# Patient Record
Sex: Female | Born: 1965 | Race: White | Hispanic: No | Marital: Married | Smoking: Former smoker
Health system: Southern US, Community
[De-identification: ages and names within clinical notes are randomized; demographics above are authoritative.]

## PROBLEM LIST (undated history)

## (undated) DIAGNOSIS — C4491 Basal cell carcinoma of skin, unspecified: Secondary | ICD-10-CM

## (undated) DIAGNOSIS — E119 Type 2 diabetes mellitus without complications: Secondary | ICD-10-CM

## (undated) DIAGNOSIS — I10 Essential (primary) hypertension: Secondary | ICD-10-CM

## (undated) DIAGNOSIS — U071 COVID-19: Secondary | ICD-10-CM

## (undated) HISTORY — DX: COVID-19: U07.1

## (undated) HISTORY — DX: Essential (primary) hypertension: I10

## (undated) HISTORY — PX: ENDOMETRIAL ABLATION: SHX621

## (undated) HISTORY — DX: Basal cell carcinoma of skin, unspecified: C44.91

## (undated) HISTORY — PX: ABDOMINAL HYSTERECTOMY: SHX81

## (undated) HISTORY — DX: Type 2 diabetes mellitus without complications: E11.9

---

## 1999-05-04 ENCOUNTER — Inpatient Hospital Stay (HOSPITAL_COMMUNITY): Admission: AD | Admit: 1999-05-04 | Discharge: 1999-05-08 | Payer: Self-pay | Admitting: Obstetrics and Gynecology

## 1999-05-12 ENCOUNTER — Encounter (HOSPITAL_COMMUNITY): Admission: RE | Admit: 1999-05-12 | Discharge: 1999-08-10 | Payer: Self-pay | Admitting: Obstetrics and Gynecology

## 1999-11-10 ENCOUNTER — Other Ambulatory Visit: Admission: RE | Admit: 1999-11-10 | Discharge: 1999-11-10 | Payer: Self-pay | Admitting: Obstetrics and Gynecology

## 2000-12-20 ENCOUNTER — Other Ambulatory Visit: Admission: RE | Admit: 2000-12-20 | Discharge: 2000-12-20 | Payer: Self-pay | Admitting: Obstetrics and Gynecology

## 2001-02-17 ENCOUNTER — Other Ambulatory Visit: Admission: RE | Admit: 2001-02-17 | Discharge: 2001-02-17 | Payer: Self-pay | Admitting: Obstetrics and Gynecology

## 2005-03-14 ENCOUNTER — Ambulatory Visit: Payer: Self-pay | Admitting: Family Medicine

## 2006-10-21 ENCOUNTER — Inpatient Hospital Stay (HOSPITAL_COMMUNITY): Admission: RE | Admit: 2006-10-21 | Discharge: 2006-10-23 | Payer: Self-pay | Admitting: Obstetrics and Gynecology

## 2011-12-02 ENCOUNTER — Emergency Department (HOSPITAL_COMMUNITY)
Admission: EM | Admit: 2011-12-02 | Discharge: 2011-12-02 | Disposition: A | Discharge status: Home / Self Care | Payer: BC Managed Care – PPO | Source: Home / Self Care | Attending: Emergency Medicine | Admitting: Emergency Medicine

## 2011-12-02 ENCOUNTER — Encounter (HOSPITAL_COMMUNITY): Payer: Self-pay | Admitting: *Deleted

## 2011-12-02 DIAGNOSIS — L03019 Cellulitis of unspecified finger: Secondary | ICD-10-CM

## 2011-12-02 MED ORDER — CEFTRIAXONE SODIUM 1 G IJ SOLR
1.0000 g | Freq: Once | INTRAMUSCULAR | Status: AC
  Administered 2011-12-02: 1 g via INTRAMUSCULAR

## 2011-12-02 MED ORDER — LIDOCAINE HCL (PF) 1 % IJ SOLN
INTRAMUSCULAR | Status: AC
  Filled 2011-12-02: qty 5

## 2011-12-02 MED ORDER — TRAMADOL HCL 50 MG PO TABS: 100.0000 mg | ORAL_TABLET | Freq: Three times a day (TID) | ORAL | Status: AC | PRN

## 2011-12-02 MED ORDER — CEFTRIAXONE SODIUM 1 G IJ SOLR
INTRAMUSCULAR | Status: AC
  Filled 2011-12-02: qty 10

## 2011-12-02 MED ORDER — SULFAMETHOXAZOLE-TMP DS 800-160 MG PO TABS: 2.0000 | ORAL_TABLET | Freq: Two times a day (BID) | ORAL | Status: AC

## 2011-12-02 NOTE — ED Provider Notes (Signed)
Chief Complaint  Patient presents with  . Insect Bite    History of Present Illness:  Brandy Hernandez was getting a sled out from under her her house last night when she felt a sudden sticking sensation over the dorsal, proximal phalanx of the right middle finger. After she had a small red papule in this area. It's gotten progressively more swollen, tender, and she has redness extending to the PIP joint and down onto the hand. She is able to move all of her joints but with pain. She denies any fever or chills. She did not see anything biting her and she denies any paresthesias or weakness. There has been a small amount of serous drainage from a small papule.  Review of Systems:  Other than noted above, the patient denies any of the following symptoms: Systemic:  No fevers, chills, sweats, or aches.  No fatigue or tiredness. Musculoskeletal:  No joint pain, arthritis, bursitis, swelling, back pain, or neck pain. Neurological:  No muscular weakness, paresthesias, headache, or trouble with speech or coordination.  No dizziness.   PMFSH:  Past medical history, family history, social history, meds, and allergies were reviewed.  Physical Exam:   Vital signs:  BP 135/93  Pulse 82  Temp(Src) 98.3 F (36.8 C) (Oral)  Resp 14  SpO2 98% Gen:  Alert and oriented times 3.  In no distress. Musculoskeletal: There is a small ulcerated area over the dorsum of the proximal phalanx of the right middle finger. There is no fluctuance. This was draining a minimal amount of serous fluid. The fluid was cultured. There is swelling and erythema extending to the knee joint and down onto the hand as well. She is able to move all of her joints but it does hurt. Otherwise, all joints had a full a ROM with no swelling, bruising or deformity.  No edema, pulses full. Extremities were warm and pink.  Capillary refill was brisk.  Skin:  Clear, warm and dry.  No rash. Neuro:  Alert and oriented times 3.  Muscle strength was normal.   Sensation was intact to light touch.   Labs:  No results found for this or any previous visit.   A culture was obtained.  Radiology:  No results found.  Medications given in [facility]:  Rocephin 1 g IM  Assessment:   Diagnoses that have been ruled out:  None  Diagnoses that are still under consideration:  None  Final diagnoses:  Cellulitis, finger - this could have been due to a bite or just to a wound.    Plan:   1.  The following meds were prescribed:   New Prescriptions   SULFAMETHOXAZOLE-TRIMETHOPRIM (BACTRIM DS) 800-160 MG PER TABLET    Take 2 tablets by mouth 2 (two) times daily.   TRAMADOL (ULTRAM) 50 MG TABLET    Take 2 tablets (100 mg total) by mouth every 8 (eight) hours as needed for pain.   2.  The patient was instructed in symptomatic care, including rest and activity, elevation, application of ice and compression.  Appropriate handouts were given. 3.  The patient was told to return if becoming worse in any way, if no better in 3 or 4 days, and given some red flag symptoms that would indicate earlier return.   4.  The patient was told to follow up in 48 hours here at the urgent care Center or gets worse in any way to come back right away.  Roque Lias, MD 12/02/11 1134

## 2011-12-02 NOTE — ED Notes (Signed)
Pt co "spider bite" to right long finger since yesterday, abcess to finger with redness, swelling and pain starting this am

## 2011-12-05 LAB — WOUND CULTURE: Gram Stain: NONE SEEN

## 2011-12-06 NOTE — ED Notes (Signed)
Wound culture R finger: Few Staph. Aureus. Pt. adequately treated with Bactrim DS. Vassie Moselle 12/06/2011

## 2013-07-13 ENCOUNTER — Telehealth: Payer: Self-pay | Admitting: Nurse Practitioner

## 2013-07-13 ENCOUNTER — Ambulatory Visit (INDEPENDENT_AMBULATORY_CARE_PROVIDER_SITE_OTHER): Payer: BC Managed Care – PPO | Admitting: Nurse Practitioner

## 2013-07-13 ENCOUNTER — Encounter: Payer: Self-pay | Admitting: Nurse Practitioner

## 2013-07-13 VITALS — BP 157/89 | HR 83 | Temp 97.5°F | Ht 70.0 in | Wt 290.0 lb

## 2013-07-13 DIAGNOSIS — M722 Plantar fascial fibromatosis: Secondary | ICD-10-CM

## 2013-07-13 MED ORDER — PREDNISONE 20 MG PO TABS: ORAL_TABLET | ORAL | Status: DC

## 2013-07-13 MED ORDER — METHYLPREDNISOLONE ACETATE 80 MG/ML IJ SUSP
80.0000 mg | Freq: Once | INTRAMUSCULAR | Status: AC
  Administered 2013-07-13: 80 mg via INTRAMUSCULAR

## 2013-07-13 NOTE — Progress Notes (Signed)
  Subjective:    Patient ID: Brandy Hernandez, female    DOB: 04/15/1966, 47 y.o.   MRN: [id number]  Foot Injury  The incident occurred 3 to 5 days ago (four days ago). Incident location: Pt has been walking and excerise daily. The injury mechanism is unknown. The pain is present in the left foot. The quality of the pain is described as stabbing. The pain is at a severity of 10/10. The pain is severe. The pain has been worsening since onset. Associated symptoms include an inability to bear weight. Pertinent negatives include no numbness or tingling. She reports no foreign bodies present. The symptoms are aggravated by weight bearing and movement. She has tried acetaminophen, elevation, ice, NSAIDs, non-weight bearing and rest for the symptoms. The treatment provided no relief.      Review of Systems  Neurological: Negative for tingling and numbness.  All other systems reviewed and are negative.       Objective:   Physical Exam  Vitals reviewed. Constitutional: She is oriented to person, place, and time. She appears well-developed and well-nourished.  Cardiovascular: Normal rate, regular rhythm, normal heart sounds and intact distal pulses.   Pulmonary/Chest: Effort normal and breath sounds normal.  Abdominal: Soft. Bowel sounds are normal. She exhibits no distension. There is no tenderness.  Musculoskeletal: She exhibits tenderness.  Pt has decreased ROM with flexion on left foot due to pain. Any weight bearing movement is "extremely painful"  Neurological: She is alert and oriented to person, place, and time.  Skin: Skin is warm and dry.  Psychiatric: She has a normal mood and affect. Her behavior is normal. Judgment and thought content normal.     BP 157/89  Pulse 83  Temp(Src) 97.5 F (36.4 C) (Oral)  Ht 5\' 10"  (1.778 m)  Wt 290 lb (131.543 kg)  BMI 41.61 kg/m2      Assessment & Plan:  1. Plantar fasciitis of left foot Ice roller to foot - methylPREDNISolone acetate  (DEPO-MEDROL) injection 80 mg; Inject 1 mL (80 mg total) into the muscle once. - predniSONE (DELTASONE) 20 MG tablet; 2 po qd (sametime everyday) X 10 days  Dispense: 10 tablet; Refill: 0 Start med tomorrow.  Mary-Margaret Daphine Deutscher, FNP

## 2013-07-13 NOTE — Telephone Encounter (Signed)
Oops only 2 a day for 5 days- Sorry!!!

## 2013-07-13 NOTE — Patient Instructions (Signed)

## 2013-07-13 NOTE — Telephone Encounter (Signed)
Appt given for today 

## 2013-07-14 NOTE — Telephone Encounter (Signed)
Called [facility] and let them know

## 2013-09-10 ENCOUNTER — Other Ambulatory Visit: Payer: Self-pay

## 2013-11-12 ENCOUNTER — Encounter: Payer: Self-pay | Admitting: Nurse Practitioner

## 2013-11-12 ENCOUNTER — Ambulatory Visit (INDEPENDENT_AMBULATORY_CARE_PROVIDER_SITE_OTHER): Payer: BC Managed Care – PPO | Admitting: Nurse Practitioner

## 2013-11-12 VITALS — BP 149/98 | HR 81 | Temp 98.6°F | Ht 70.0 in | Wt 290.0 lb

## 2013-11-12 DIAGNOSIS — J069 Acute upper respiratory infection, unspecified: Secondary | ICD-10-CM

## 2013-11-12 DIAGNOSIS — J029 Acute pharyngitis, unspecified: Secondary | ICD-10-CM

## 2013-11-12 LAB — POCT RAPID STREP A (OFFICE): RAPID STREP A SCREEN: NEGATIVE

## 2013-11-12 MED ORDER — HYDROCODONE-HOMATROPINE 5-1.5 MG/5ML PO SYRP: 5.0000 mL | ORAL_SOLUTION | Freq: Three times a day (TID) | ORAL | Status: DC | PRN

## 2013-11-12 MED ORDER — AZITHROMYCIN 250 MG PO TABS: ORAL_TABLET | ORAL | Status: DC

## 2013-11-12 NOTE — Patient Instructions (Signed)

## 2013-11-12 NOTE — Progress Notes (Signed)
   Subjective:    Patient ID: Hassan Buckler, female    DOB: May 29, 1966, 48 y.o.   MRN: [id number]  HPI  Patient today c/o congestion and cough- Started Monday- Woke up with sore throat this morning- OTC meds have given her no relief.    Review of Systems  Constitutional: Positive for fever (low grade last night). Negative for chills.  HENT: Positive for congestion, postnasal drip, rhinorrhea, sinus pressure, sore throat and trouble swallowing. Negative for ear pain and facial swelling.   Respiratory: Positive for cough (productive at times).   Cardiovascular: Negative.   All other systems reviewed and are negative.       Objective:   Physical Exam  Constitutional: She is oriented to person, place, and time. She appears well-developed and well-nourished.  HENT:  Right Ear: Hearing, tympanic membrane, external ear and ear canal normal.  Left Ear: Hearing, tympanic membrane, external ear and ear canal normal.  Nose: Mucosal edema and rhinorrhea present. Right sinus exhibits no maxillary sinus tenderness and no frontal sinus tenderness. Left sinus exhibits no maxillary sinus tenderness and no frontal sinus tenderness.  Mouth/Throat: Uvula is midline. Posterior oropharyngeal erythema present.  Cardiovascular: Normal rate and normal heart sounds.   Pulmonary/Chest: Effort normal and breath sounds normal.  Neurological: She is alert and oriented to person, place, and time.  Skin: Skin is warm.    BP 149/98  Pulse 81  Temp(Src) 98.6 F (37 C) (Oral)  Ht 5\' 10"  (1.778 m)  Wt 290 lb (131.543 kg)  BMI 41.61 kg/m2      Assessment & Plan:   1. Sore throat   2. Upper respiratory infection    Meds ordered this encounter  Medications  . azithromycin (ZITHROMAX Z-PAK) 250 MG tablet    Sig: As directed    Dispense:  6 each    Refill:  0    Order Specific Question:  Supervising Provider    Answer:  Chipper Herb [[id number]]  . HYDROcodone-homatropine (HYCODAN) 5-1.5 MG/5ML syrup   Sig: Take 5 mLs by mouth every 8 (eight) hours as needed for cough.    Dispense:  120 mL    Refill:  0    Order Specific Question:  Supervising Provider    Answer:  Chipper Herb [[id number]]   1. Take meds as prescribed 2. Use a cool mist humidifier especially during the winter months and when heat has  been humid. 3. Use saline nose sprays frequently 4. Saline irrigations of the nose can be very helpful if done frequently.  * 4X daily for 1 week*  * Use of a nettie pot can be helpful with this. Follow directions with this* 5. Drink plenty of fluids 6. Keep thermostat turn down low 7.For any cough or congestion  Use plain Mucinex- regular strength or max strength is fine   * Children- consult with Pharmacist for dosing 8. For fever or aces or pains- take tylenol or ibuprofen appropriate for age and weight.  * for fevers greater than 101 orally you may alternate ibuprofen and tylenol every  3 hours.   Mary-Margaret Hassell Done, FNP

## 2014-02-01 ENCOUNTER — Telehealth: Payer: Self-pay | Admitting: Nurse Practitioner

## 2014-02-01 ENCOUNTER — Encounter: Payer: Self-pay | Admitting: Family Medicine

## 2014-02-01 ENCOUNTER — Ambulatory Visit (INDEPENDENT_AMBULATORY_CARE_PROVIDER_SITE_OTHER): Payer: BC Managed Care – PPO | Admitting: Family Medicine

## 2014-02-01 ENCOUNTER — Ambulatory Visit (INDEPENDENT_AMBULATORY_CARE_PROVIDER_SITE_OTHER): Payer: BC Managed Care – PPO

## 2014-02-01 VITALS — BP 136/83 | HR 81 | Temp 98.5°F | Ht 70.0 in | Wt 294.6 lb

## 2014-02-01 DIAGNOSIS — M25562 Pain in left knee: Secondary | ICD-10-CM

## 2014-02-01 DIAGNOSIS — M25569 Pain in unspecified knee: Secondary | ICD-10-CM

## 2014-02-01 MED ORDER — NAPROXEN 500 MG PO TABS: 500.0000 mg | ORAL_TABLET | Freq: Two times a day (BID) | ORAL | Status: DC

## 2014-02-01 NOTE — Telephone Encounter (Signed)
appt at 3:30 WITH [place]

## 2014-02-01 NOTE — Progress Notes (Signed)
   Subjective:    Patient ID: Brandy Hernandez, female    DOB: Aug 14, 1966, 48 y.o.   MRN: [id number]  HPI This 48 y.o. female presents for evaluation of left knee pain and discomfort. She was wrestling her Son and he accidentally fell on her knee and she heard a pop and it has been uncomfortable today.   Review of Systems C/o left knee pain No chest pain, SOB, HA, dizziness, vision change, N/V, diarrhea, constipation, dysuria, urinary urgency or frequency or rash.     Objective:   Physical Exam  Vital signs noted  Well developed well nourished female.  HEENT - Head atraumatic Normocephalic                Eyes - PERRLA, Conjuctiva - clear Sclera- Clear EOMI                Ears - EAC's Wnl TM's Wnl Gross Hearing WNL                Throat - oropharanx wnl Respiratory - Lungs CTA bilateral Cardiac - RRR S1 and S2 without murmur GI - Abdomen soft Nontender and bowel sounds active x 4 MS - TTP left medial knee.  No crepitus of deformity noted.  Xray of Left knee - No fracture Prelimnary reading by Iverson Alamin    Assessment & Plan:  Knee pain, left - Plan: naproxen (NAPROSYN) 500 MG tablet, DG Knee 1-2 Views Left Apply ice and take naprosyn and follow up prn.  Lysbeth Penner FNP

## 2014-02-15 ENCOUNTER — Encounter: Payer: Self-pay | Admitting: Family Medicine

## 2014-02-15 ENCOUNTER — Ambulatory Visit (INDEPENDENT_AMBULATORY_CARE_PROVIDER_SITE_OTHER): Payer: BC Managed Care – PPO | Admitting: Family Medicine

## 2014-02-15 VITALS — BP 140/88 | HR 75 | Temp 98.0°F | Ht 70.0 in | Wt 294.0 lb

## 2014-02-15 DIAGNOSIS — M25569 Pain in unspecified knee: Secondary | ICD-10-CM

## 2014-02-15 DIAGNOSIS — M25562 Pain in left knee: Secondary | ICD-10-CM

## 2014-02-15 MED ORDER — NAPROXEN 500 MG PO TABS: 500.0000 mg | ORAL_TABLET | Freq: Two times a day (BID) | ORAL | Status: DC

## 2014-02-15 NOTE — Progress Notes (Signed)
   Subjective:    Patient ID: Brandy Hernandez, female    DOB: 05-Jan-1966, 48 y.o.   MRN: [id number]  HPI This 48 y.o. female presents for evaluation of left knee pain and discomfort.  She has been on naproxen for 2 weeks and no changes, she is having left knee arthralgias.  She had an xray of the left  Knee and it was normal.   Review of Systems C/o left knee pain   No chest pain, SOB, HA, dizziness, vision change, N/V, diarrhea, constipation, dysuria, urinary urgency or frequency, myalgias, arthralgias or rash.  Objective:   Physical Exam Vital signs noted  Well developed well nourished female.  HEENT - Head atraumatic Normocephalic Respiratory - Lungs CTA bilateral Cardiac - RRR S1 and S2 without murmur GI - Abdomen soft Nontender and bowel sounds active x 4 MS - TTP left knee  Procedure - One cc of marcaine, one cc of lidocaine 1% w/o epi and one cc of kenalog 40mg  injected into left medial knee space after prepped with ETOH and patient tolerated well and expresses relief.       Assessment & Plan:  Knee pain, left - Plan: naproxen (NAPROSYN) 500 MG tablet One po  Bid x 10 days.  Injection of left knee with one cc of marcaine, one cc of lidocaine, and one cc Of kenalog or 40mg  injected left knee. If not better then refer to [place] FNP

## 2014-03-18 LAB — HM DIABETES EYE EXAM

## 2014-07-15 ENCOUNTER — Ambulatory Visit (INDEPENDENT_AMBULATORY_CARE_PROVIDER_SITE_OTHER): Payer: BC Managed Care – PPO | Admitting: Family Medicine

## 2014-07-15 VITALS — BP 145/97 | HR 73 | Temp 96.9°F | Ht 70.0 in | Wt 286.0 lb

## 2014-07-15 DIAGNOSIS — T63444A Toxic effect of venom of bees, undetermined, initial encounter: Secondary | ICD-10-CM

## 2014-07-15 DIAGNOSIS — T6391XA Toxic effect of contact with unspecified venomous animal, accidental (unintentional), initial encounter: Secondary | ICD-10-CM

## 2014-07-15 DIAGNOSIS — T65891A Toxic effect of other specified substances, accidental (unintentional), initial encounter: Secondary | ICD-10-CM

## 2014-07-15 DIAGNOSIS — L539 Erythematous condition, unspecified: Secondary | ICD-10-CM

## 2014-07-15 MED ORDER — PREDNISONE 10 MG PO TABS: ORAL_TABLET | ORAL | Status: DC

## 2014-07-15 MED ORDER — CEPHALEXIN 500 MG PO CAPS: 500.0000 mg | ORAL_CAPSULE | Freq: Four times a day (QID) | ORAL | Status: DC

## 2014-07-15 MED ORDER — METHYLPREDNISOLONE ACETATE 80 MG/ML IJ SUSP
60.0000 mg | Freq: Once | INTRAMUSCULAR | Status: AC
  Administered 2014-07-15: 60 mg via INTRAMUSCULAR

## 2014-07-15 NOTE — Progress Notes (Signed)
   Subjective:    Patient ID: Brandy Hernandez, female    DOB: 1966-03-13, 48 y.o.   MRN: [id number]  HPI Patient here today for bee sting to right forearm. This happened yesterday. She has not had any shortness of breath or other symptoms other than the local erythema and swelling of her right forearm.the pain does radiate up her arm .         There are no active problems to display for this patient.  Outpatient Encounter Prescriptions as of 07/15/2014  Medication Sig  . [DISCONTINUED] naproxen (NAPROSYN) 500 MG tablet Take 1 tablet (500 mg total) by mouth 2 (two) times daily with a meal.    Review of Systems  Constitutional: Negative.   HENT: Negative.   Eyes: Negative.   Respiratory: Negative.   Cardiovascular: Negative.   Gastrointestinal: Negative.   Endocrine: Negative.   Genitourinary: Negative.   Musculoskeletal: Negative.   Skin: Negative.        Bee sting to right forearm   Allergic/Immunologic: Negative.   Neurological: Negative.   Hematological: Negative.   Psychiatric/Behavioral: Negative.        Objective:   Physical Exam  Nursing note and vitals reviewed. Constitutional: She is oriented to person, place, and time. She appears well-developed and well-nourished. No distress.  HENT:  Head: Normocephalic and atraumatic.  Eyes: Conjunctivae and EOM are normal. Pupils are equal, round, and reactive to light. Right eye exhibits no discharge. Left eye exhibits no discharge. No scleral icterus.  Neck: Normal range of motion.  Cardiovascular: Normal rate, regular rhythm and normal heart sounds.   At 72 per minute  Pulmonary/Chest: Effort normal. No respiratory distress. She has no wheezes. She has rales.  Musculoskeletal: Normal range of motion.  Neurological: She is alert and oriented to person, place, and time.  Skin: Skin is warm and dry. Rash noted. There is erythema. No pallor.  There is erythema and rubor to the right forearm secondary to the wasp sting    Psychiatric: She has a normal mood and affect. Her behavior is normal. Judgment and thought content normal.   BP 145/97  Pulse 73  Temp(Src) 96.9 F (36.1 C) (Oral)  Ht 5\' 10"  (1.778 m)  Wt 286 lb (129.729 kg)  BMI 41.04 kg/m2        Assessment & Plan:  1. Bee sting, undetermined intent, initial encounter - methylPREDNISolone acetate (DEPO-MEDROL) injection 60 mg; Inject 0.75 mLs (60 mg total) into the muscle once. - cephALEXin (KEFLEX) 500 MG capsule; Take 1 capsule (500 mg total) by mouth 4 (four) times daily.  Dispense: 21 capsule; Refill: 0    2. Erythema - cephALEXin (KEFLEX) 500 MG capsule; Take 1 capsule (500 mg total) by mouth 4 (four) times daily.  Dispense: 21 capsule; Refill: 0  Patient Instructions  Continue to use ice and Benadryl Take prescribed medication as directed Use a sling during the day to elevate the arm    Arrie Senate MD

## 2014-07-15 NOTE — Patient Instructions (Signed)
Continue to use ice and Benadryl Take prescribed medication as directed Use a sling during the day to elevate the arm

## 2015-02-14 ENCOUNTER — Ambulatory Visit (INDEPENDENT_AMBULATORY_CARE_PROVIDER_SITE_OTHER): Payer: BC Managed Care – PPO | Admitting: Nurse Practitioner

## 2015-02-14 ENCOUNTER — Encounter: Payer: Self-pay | Admitting: Nurse Practitioner

## 2015-02-14 VITALS — BP 154/87 | HR 81 | Temp 97.5°F | Ht 70.0 in | Wt 285.0 lb

## 2015-02-14 DIAGNOSIS — B349 Viral infection, unspecified: Secondary | ICD-10-CM | POA: Diagnosis not present

## 2015-02-14 DIAGNOSIS — J329 Chronic sinusitis, unspecified: Secondary | ICD-10-CM | POA: Diagnosis not present

## 2015-02-14 DIAGNOSIS — R490 Dysphonia: Secondary | ICD-10-CM

## 2015-02-14 DIAGNOSIS — B9789 Other viral agents as the cause of diseases classified elsewhere: Secondary | ICD-10-CM

## 2015-02-14 DIAGNOSIS — I10 Essential (primary) hypertension: Secondary | ICD-10-CM

## 2015-02-14 DIAGNOSIS — E1159 Type 2 diabetes mellitus with other circulatory complications: Secondary | ICD-10-CM | POA: Insufficient documentation

## 2015-02-14 MED ORDER — FLUTICASONE PROPIONATE 50 MCG/ACT NA SUSP: 2.0000 | Freq: Every day | NASAL | Status: DC

## 2015-02-14 MED ORDER — LISINOPRIL 20 MG PO TABS: 20.0000 mg | ORAL_TABLET | Freq: Every day | ORAL | Status: DC

## 2015-02-14 MED ORDER — METHYLPREDNISOLONE ACETATE 80 MG/ML IJ SUSP
80.0000 mg | Freq: Once | INTRAMUSCULAR | Status: AC
  Administered 2015-02-14: 80 mg via INTRAMUSCULAR

## 2015-02-14 NOTE — Patient Instructions (Signed)
Hoarseness Hoarseness is produced from a variety of causes. It is important to find the cause so it can be treated. In the absence of a cold or upper respiratory illness, any hoarseness lasting more than 2 weeks should be looked at by a specialist. This is especially important if you have a history of smoking or alcohol use. It is also important to keep in mind that as you grow older, your voice will naturally get weaker, making it easier for you to become hoarse from straining your vocal cords.  CAUSES  Any illness that affects your vocal cords can result in a hoarse voice. Examples of conditions that can affect the vocal cords are listed as follows:   Allergies.  Colds.  Sinusitis.  Gastroesophageal reflux disease.  Croup.  Injury.  Nodules.  Exposure to smoke or toxic fumes or gases.  Congenital and genetic defects.  Paralysis of the vocal cords.  Infections.  Advanced age. DIAGNOSIS  In order to diagnose the cause of your hoarseness, your caregiver will examine your throat using an instrument that uses a tube with a small lighted camera (laryngoscope). It allows your caregiver to look into the mouth and down the throat. TREATMENT  For most cases, treatment will focus on the specific cause of the hoarseness. Depending on the cause, hoarseness can be a temporary condition (acute) or it can be long lasting (chronic). Most cases of hoarseness clear up without complications. Your caregiver will explain to you if this is not likely to happen. SEEK IMMEDIATE MEDICAL CARE IF:   You have increasing hoarseness or loss of voice.  You have shortness of breath.  You are coughing up blood.  There is pain in your neck or throat. Document Released: 10/05/2005 Document Revised: 01/14/2012 Document Reviewed: 12/28/2010 Exit[employer] Patient Information 2015 [employer]. This information is not intended to replace advice given to you by your health care provider. Make sure you discuss any  questions you have with your health care provider.  

## 2015-02-14 NOTE — Progress Notes (Signed)
Subjective:    Patient ID: Brandy Hernandez, female    DOB: 1965-12-13, 49 y.o.   MRN: [id number]  HPI Hoarseness cough and sinus pressure for past 7 days- not getting any worse, has been taking dayquil, sudafed, zyrtec without relief of symptoms. Denies knowledge of exposure to sick individuals, no travel recently. Denies history of allergies.      Review of Systems  Constitutional: Negative for fever, chills and fatigue.  HENT: Positive for postnasal drip, sinus pressure (Bilateral, right worse than left) and voice change. Negative for congestion, ear discharge, ear pain, sneezing, sore throat and trouble swallowing.   Eyes: Negative.   Respiratory: Positive for cough (Productive of scant green mucous when first waking up. ). Negative for chest tightness, shortness of breath and wheezing.   Cardiovascular: Negative for chest pain and palpitations.  Gastrointestinal: Negative.   Musculoskeletal: Negative for arthralgias.  Allergic/Immunologic: Negative for environmental allergies.  Neurological: Negative for headaches.  All other systems reviewed and are negative.      Objective:   Physical Exam  Constitutional: She is oriented to person, place, and time. She appears well-developed and well-nourished. No distress.  HENT:  Right Ear: External ear normal.  Left Ear: External ear normal.  Nose: Nose normal. Right sinus exhibits no maxillary sinus tenderness and no frontal sinus tenderness. Left sinus exhibits no maxillary sinus tenderness and no frontal sinus tenderness.  Mouth/Throat: Uvula is midline and mucous membranes are normal. Posterior oropharyngeal erythema (Mildly injected. ) present. No oropharyngeal exudate.  Eyes: Conjunctivae are normal. Pupils are equal, round, and reactive to light. Right eye exhibits no discharge. Left eye exhibits no discharge.  Neck: Neck supple. No JVD present.  Cardiovascular: Normal rate, regular rhythm and normal heart sounds.  Exam reveals no  gallop and no friction rub.   No murmur heard. Pulmonary/Chest: Effort normal and breath sounds normal. No respiratory distress. She has no wheezes. She has no rales.  Abdominal: Soft. Bowel sounds are normal.  Lymphadenopathy:    She has no cervical adenopathy.  Neurological: She is alert and oriented to person, place, and time.  Skin: Skin is warm and dry. She is not diaphoretic.  Psychiatric: She has a normal mood and affect. Her behavior is normal. Judgment and thought content normal.    BP 154/87 mmHg  Pulse 81  Temp(Src) 97.5 F (36.4 C) (Oral)  Ht 5' 10"  (1.778 m)  Wt 285 lb (129.275 kg)  BMI 40.89 kg/m2       Assessment & Plan:  1. Viral sinusitis 1. Take meds as prescribed 2. Use a cool mist humidifier especially during the winter months and when heat has been humid. 3. Use saline nose sprays frequently 4. Saline irrigations of the nose can be very helpful if done frequently.  * 4X daily for 1 week*  * Use of a nettie pot can be helpful with this. Follow directions with this* 5. Drink plenty of fluids 6. Keep thermostat turn down low 7.For any cough or congestion  Use plain Mucinex- regular strength or max strength is fine   * Children- consult with Pharmacist for dosing 8. For fever or aces or pains- take tylenol or ibuprofen appropriate for age and weight.  * for fevers greater than 101 orally you may alternate ibuprofen and tylenol every  3 hours.   -flonase nasal spray- 2 sprays each nostril daily  2. Hoarseness of voice - methylPREDNISolone acetate (DEPO-MEDROL) injection 80 mg; Inject 1 mL (80 mg total) into  the muscle once.  3. Hypertension - do not add slat to diet Avoid decongestants OTC -lisinopril 43m 1 po qd #90 1 refill Orders Placed This Encounter  Procedures  . CMP14+EGFR  . NMR, lipoprofile   Follow up in 3 months  [place] FNP

## 2015-02-15 ENCOUNTER — Other Ambulatory Visit: Payer: Self-pay | Admitting: Nurse Practitioner

## 2015-02-15 LAB — NMR, LIPOPROFILE
CHOLESTEROL: 213 mg/dL — AB (ref 100–199)
HDL CHOLESTEROL BY NMR: 33 mg/dL — AB (ref >39)
HDL PARTICLE NUMBER: 26.2 umol/L — AB (ref >=30.5)
LDL PARTICLE NUMBER: 1854 nmol/L — AB (ref <1000)
LDL Size: 20.7 nm (ref >20.5)
LDL-C: 141 mg/dL — AB (ref 0–99)
LP-IR SCORE: 63 — AB (ref <=45)
Small LDL Particle Number: 963 nmol/L — ABNORMAL HIGH (ref <=527)
TRIGLYCERIDES BY NMR: 196 mg/dL — AB (ref 0–149)

## 2015-02-15 LAB — CMP14+EGFR
A/G RATIO: 1.4 (ref 1.1–2.5)
ALK PHOS: 63 IU/L (ref 39–117)
ALT: 54 IU/L — AB (ref 0–32)
AST: 37 IU/L (ref 0–40)
Albumin: 4.2 g/dL (ref 3.5–5.5)
BILIRUBIN TOTAL: 0.2 mg/dL (ref 0.0–1.2)
BUN / CREAT RATIO: 16 (ref 9–23)
BUN: 11 mg/dL (ref 6–24)
CHLORIDE: 102 mmol/L (ref 97–108)
CO2: 23 mmol/L (ref 18–29)
Calcium: 9.1 mg/dL (ref 8.7–10.2)
Creatinine, Ser: 0.69 mg/dL (ref 0.57–1.00)
GFR calc Af Amer: 119 mL/min/{1.73_m2} (ref >59)
GFR calc non Af Amer: 103 mL/min/{1.73_m2} (ref >59)
Globulin, Total: 3 g/dL (ref 1.5–4.5)
Glucose: 136 mg/dL — ABNORMAL HIGH (ref 65–99)
POTASSIUM: 4.7 mmol/L (ref 3.5–5.2)
SODIUM: 139 mmol/L (ref 134–144)
TOTAL PROTEIN: 7.2 g/dL (ref 6.0–8.5)

## 2015-02-15 MED ORDER — ATORVASTATIN CALCIUM 40 MG PO TABS: 40.0000 mg | ORAL_TABLET | Freq: Every day | ORAL | Status: DC

## 2015-08-23 ENCOUNTER — Ambulatory Visit: Payer: BC Managed Care – PPO | Admitting: Nurse Practitioner

## 2015-08-24 ENCOUNTER — Encounter: Payer: Self-pay | Admitting: Nurse Practitioner

## 2016-01-24 ENCOUNTER — Telehealth: Payer: Self-pay | Admitting: Nurse Practitioner

## 2016-09-12 ENCOUNTER — Encounter: Payer: Self-pay | Admitting: Nurse Practitioner

## 2016-09-12 ENCOUNTER — Ambulatory Visit (INDEPENDENT_AMBULATORY_CARE_PROVIDER_SITE_OTHER): Payer: Worker's Compensation

## 2016-09-12 ENCOUNTER — Ambulatory Visit (INDEPENDENT_AMBULATORY_CARE_PROVIDER_SITE_OTHER): Payer: Worker's Compensation | Admitting: Nurse Practitioner

## 2016-09-12 VITALS — BP 142/88 | HR 85 | Temp 97.3°F | Ht 70.0 in | Wt 292.0 lb

## 2016-09-12 DIAGNOSIS — M25531 Pain in right wrist: Secondary | ICD-10-CM | POA: Diagnosis not present

## 2016-09-12 DIAGNOSIS — S63501A Unspecified sprain of right wrist, initial encounter: Secondary | ICD-10-CM | POA: Diagnosis not present

## 2016-09-12 NOTE — Progress Notes (Signed)
   Subjective:    Patient ID: Brandy Hernandez, female    DOB: 1966/09/08, 50 y.o.   MRN: [id number]  HPI DATE OF BIRTH: [birth year]  EMP: [facility] Patient here today C/O right wrist pain. She tripped over one of the  Kindergarten children yesterday and when she fell she landed on her right wrist. Painful to move this morning.   Review of Systems  Constitutional: Negative.   HENT: Negative.   Respiratory: Negative.   Cardiovascular: Negative.   Genitourinary: Negative.   Musculoskeletal: Positive for joint swelling.  Neurological: Negative.   Psychiatric/Behavioral: Negative.   All other systems reviewed and are negative.      Objective:   Physical Exam  Constitutional: She is oriented to person, place, and time. She appears well-developed and well-nourished. No distress.  Cardiovascular: Normal rate, regular rhythm and normal heart sounds.   Pulmonary/Chest: Effort normal and breath sounds normal.  Musculoskeletal:  Pian along radial side of wrist to palpation. Pain with full supination and flexion of wrist.  Neurological: She is alert and oriented to person, place, and time.  Skin: Skin is warm.  Psychiatric: She has a normal mood and affect. Her behavior is normal. Judgment and thought content normal.     Wrist xray-  No obvious fracture- will wait on radiology report-Preliminary reading by Ronnald Collum, FNP  [facility]      Assessment & Plan:   1. Right wrist pain   2. Sprain of right wrist, initial encounter    Wrist brace for 4 weeks Ice bid Motrin OTC for pain RTO prn  Mary-Margaret Hassell Done, FNP

## 2016-09-12 NOTE — Patient Instructions (Signed)
Ankle Sprain  An ankle sprain is an injury to the strong, fibrous tissues (ligaments) that hold the bones of your ankle joint together.   CAUSES  An ankle sprain is usually caused by a fall or by twisting your ankle. Ankle sprains most commonly occur when you step on the outer edge of your foot, and your ankle turns inward. People who participate in sports are more prone to these types of injuries.   SYMPTOMS    Pain in your ankle. The pain may be present at rest or only when you are trying to stand or walk.   Swelling.   Bruising. Bruising may develop immediately or within 1 to 2 days after your injury.   Difficulty standing or walking, particularly when turning corners or changing directions.  DIAGNOSIS   Your caregiver will ask you details about your injury and perform a physical exam of your ankle to determine if you have an ankle sprain. During the physical exam, your caregiver will press on and apply pressure to specific areas of your foot and ankle. Your caregiver will try to move your ankle in certain ways. An X-ray exam may be done to be sure a bone was not broken or a ligament did not separate from one of the bones in your ankle (avulsion fracture).   TREATMENT   Certain types of braces can help stabilize your ankle. Your caregiver can make a recommendation for this. Your caregiver may recommend the use of medicine for pain. If your sprain is severe, your caregiver may refer you to a surgeon who helps to restore function to parts of your skeletal system (orthopedist) or a physical therapist.  HOME CARE INSTRUCTIONS    Apply ice to your injury for 1-2 days or as directed by your caregiver. Applying ice helps to reduce inflammation and pain.    Put ice in a plastic bag.    Place a towel between your skin and the bag.    Leave the ice on for 15-20 minutes at a time, every 2 hours while you are awake.   Only take over-the-counter or prescription medicines for pain, discomfort, or fever as directed by  your caregiver.   Elevate your injured ankle above the level of your heart as much as possible for 2-3 days.   If your caregiver recommends crutches, use them as instructed. Gradually put weight on the affected ankle. Continue to use crutches or a cane until you can walk without feeling pain in your ankle.   If you have a plaster splint, wear the splint as directed by your caregiver. Do not rest it on anything harder than a pillow for the first 24 hours. Do not put weight on it. Do not get it wet. You may take it off to take a shower or bath.   You may have been given an elastic bandage to wear around your ankle to provide support. If the elastic bandage is too tight (you have numbness or tingling in your foot or your foot becomes cold and blue), adjust the bandage to make it comfortable.   If you have an air splint, you may blow more air into it or let air out to make it more comfortable. You may take your splint off at night and before taking a shower or bath. Wiggle your toes in the splint several times per day to decrease swelling.  SEEK MEDICAL CARE IF:    You have rapidly increasing bruising or swelling.   Your toes feel   extremely cold or you lose feeling in your foot.   Your pain is not relieved with medicine.  SEEK IMMEDIATE MEDICAL CARE IF:   Your toes are numb or blue.   You have severe pain that is increasing.  MAKE SURE YOU:    Understand these instructions.   Will watch your condition.   Will get help right away if you are not doing well or get worse.     This information is not intended to replace advice given to you by your health care provider. Make sure you discuss any questions you have with your health care provider.     Document Released: 10/22/2005 Document Revised: 11/12/2014 Document Reviewed: 11/03/2011  Elsevier Interactive Patient Education 2016 [employer]

## 2016-11-05 HISTORY — PX: SKIN CANCER EXCISION: SHX779

## 2017-02-13 ENCOUNTER — Ambulatory Visit (INDEPENDENT_AMBULATORY_CARE_PROVIDER_SITE_OTHER): Payer: BC Managed Care – PPO | Admitting: Family

## 2017-02-13 ENCOUNTER — Encounter: Payer: Self-pay | Admitting: Family

## 2017-02-13 VITALS — BP 163/102 | HR 79 | Temp 97.5°F | Ht 70.0 in | Wt 295.0 lb

## 2017-02-13 DIAGNOSIS — J0100 Acute maxillary sinusitis, unspecified: Secondary | ICD-10-CM

## 2017-02-13 DIAGNOSIS — R030 Elevated blood-pressure reading, without diagnosis of hypertension: Secondary | ICD-10-CM

## 2017-02-13 DIAGNOSIS — Z6841 Body Mass Index (BMI) 40.0 and over, adult: Secondary | ICD-10-CM

## 2017-02-13 MED ORDER — AMOXICILLIN-POT CLAVULANATE 875-125 MG PO TABS: 1.0000 | ORAL_TABLET | Freq: Two times a day (BID) | ORAL | 0 refills | Status: DC

## 2017-02-13 NOTE — Progress Notes (Signed)
Subjective:    Patient ID: Brandy Hernandez, female    DOB: 1965/12/08, 51 y.o.   MRN: [id number]  Pt presents to the office today with elevated BP and sinus problems. Pt has been taking decongestant, but last several acute visits at office BP has been elevated.  Sinus Problem  This is a new problem. The current episode started in the past 7 days. The problem has been gradually worsening since onset. There has been no fever. Her pain is at a severity of 5/10. The pain is moderate. Associated symptoms include congestion, coughing, headaches, a hoarse voice, sinus pressure, sneezing, a sore throat and swollen glands. Pertinent negatives include no ear pain or shortness of breath. Past treatments include oral decongestants. The treatment provided mild relief.      Review of Systems  HENT: Positive for congestion, hoarse voice, sinus pressure, sneezing and sore throat. Negative for ear pain.   Respiratory: Positive for cough. Negative for shortness of breath.   Neurological: Positive for headaches.  All other systems reviewed and are negative.      Objective:   Physical Exam  Constitutional: She is oriented to person, place, and time. She appears well-developed and well-nourished. No distress.  HENT:  Head: Normocephalic and atraumatic.  Nose: Mucosal edema and rhinorrhea present. Right sinus exhibits maxillary sinus tenderness. Left sinus exhibits maxillary sinus tenderness.  Mouth/Throat: Posterior oropharyngeal erythema present.  Eyes: Pupils are equal, round, and reactive to light.  Neck: Normal range of motion. Neck supple. No thyromegaly present.  Cardiovascular: Normal rate, regular rhythm, normal heart sounds and intact distal pulses.   No murmur heard. Pulmonary/Chest: Effort normal and breath sounds normal. No respiratory distress. She has no wheezes.  Abdominal: Soft. Bowel sounds are normal. She exhibits no distension. There is no tenderness.  Musculoskeletal: Normal range of  motion. She exhibits no edema or tenderness.  Neurological: She is alert and oriented to person, place, and time. She has normal reflexes. No cranial nerve deficit.  Skin: Skin is warm and dry.  Psychiatric: She has a normal mood and affect. Her behavior is normal. Judgment and thought content normal.  Vitals reviewed.     BP (!) 163/102   Pulse 79   Temp 97.5 F (36.4 C) (Oral)   Ht 5\' 10"  (1.778 m)   Wt 295 lb (133.8 kg)   BMI 42.33 kg/m      Assessment & Plan:  1. Acute maxillary sinusitis, recurrence not specified - Take meds as prescribed - Use a cool mist humidifier  -Use saline nose sprays frequently -Saline irrigations of the nose can be very helpful if done frequently.  * 4X daily for 1 week*  * Use of a nettie pot can be helpful with this. Follow directions with this* -Force fluids -For any cough or congestion  Use plain Mucinex- regular strength or max strength is fine   * Children- consult with Pharmacist for dosing -For fever or aces or pains- take tylenol or ibuprofen appropriate for age and weight.  * for fevers greater than 101 orally you may alternate ibuprofen and tylenol every  3 hours. -Throat lozenges if help - amoxicillin-clavulanate (AUGMENTIN) 875-125 MG tablet; Take 1 tablet by mouth 2 (two) times daily.  Dispense: 14 tablet; Refill: 0  2. Elevated blood pressure reading -Stop decongestant  RTO in 2 weeks for CPE with lab work. Discussed pt will more than likely need to start medications at that time  3. Morbid obesity with BMI of 40.0-44.9,  adult [facility])   Evelina Dun, FNP

## 2017-02-13 NOTE — Patient Instructions (Signed)

## 2017-02-22 ENCOUNTER — Ambulatory Visit (INDEPENDENT_AMBULATORY_CARE_PROVIDER_SITE_OTHER): Payer: BC Managed Care – PPO | Admitting: Family Medicine

## 2017-02-22 ENCOUNTER — Encounter: Payer: Self-pay | Admitting: Family Medicine

## 2017-02-22 VITALS — BP 160/90 | HR 80 | Temp 97.2°F | Ht 70.0 in | Wt 300.2 lb

## 2017-02-22 DIAGNOSIS — I10 Essential (primary) hypertension: Secondary | ICD-10-CM | POA: Diagnosis not present

## 2017-02-22 DIAGNOSIS — R739 Hyperglycemia, unspecified: Secondary | ICD-10-CM

## 2017-02-22 MED ORDER — HYDROCHLOROTHIAZIDE 25 MG PO TABS: 25.0000 mg | ORAL_TABLET | Freq: Every day | ORAL | 3 refills | Status: DC

## 2017-02-22 NOTE — Patient Instructions (Signed)
Great to meet you!  Start hctz 1 pill once daily, ideally in the morning.   Come back in 3-4 weeks, continue keeping your blood pressure log.   You may need a second blood pressure medication but we can take it as slow as you need to help you adjust to a new blood pressure.

## 2017-02-22 NOTE — Progress Notes (Signed)
   HPI  Patient presents today well elevated blood pressures.  Patient's been watching her blood pressures over the last few weeks, they're consistently 145-155/90-100. Over the last 2 or 3 days she's had higher blood pressures measured at school by the school nurse measuring as high as 006 and 349 systolic over 494 diastolic.  Patient has had a mild persistent headache throughout the day. No vision changes, chest pain, or dyspnea.  She has mild swelling at baseline.  Previously took lisinopril but did not like the way it made her feel. I explained that this may be due to just changing her blood pressure in general.  PMH: Smoking status noted ROS: Per HPI  Objective: BP (!) 160/90   Pulse 80   Temp 97.2 F (36.2 C) (Oral)   Ht '5\' 10"'$  (1.778 m)   Wt (!) 300 lb 3.2 oz (136.2 kg)   BMI 43.07 kg/m  Gen: NAD, alert, cooperative with exam HEENT: NCAT CV: RRR, good S1/S2, no murmur Resp: CTABL, no wheezes, non-labored Ext: No edema, warm Neuro: Alert and oriented, No gross deficits  Assessment and plan:  # HTN Start HCTZ Follow-up 3-4 weeks. Basic labs done today, likely will have to add a second medication, however explained that as she will likely have some weakness and decreased energy just from treating her blood pressure we will take it slow start one for now.    Orders Placed This Encounter  Procedures  . CMP14+EGFR  . TSH    Meds ordered this encounter  Medications  . hydrochlorothiazide (HYDRODIURIL) 25 MG tablet    Sig: Take 1 tablet (25 mg total) by mouth daily.    Dispense:  30 tablet    Refill:  [place], MD [facility] 02/22/2017, 6:13 PM

## 2017-02-24 LAB — CMP14+EGFR
ALBUMIN: 3.9 g/dL (ref 3.5–5.5)
ALT: 56 IU/L — ABNORMAL HIGH (ref 0–32)
AST: 35 IU/L (ref 0–40)
Albumin/Globulin Ratio: 1.6 (ref 1.2–2.2)
Alkaline Phosphatase: 71 IU/L (ref 39–117)
BUN/Creatinine Ratio: 11 (ref 9–23)
BUN: 8 mg/dL (ref 6–24)
Bilirubin Total: 0.2 mg/dL (ref 0.0–1.2)
CO2: 26 mmol/L (ref 18–29)
CREATININE: 0.76 mg/dL (ref 0.57–1.00)
Calcium: 9 mg/dL (ref 8.7–10.2)
Chloride: 101 mmol/L (ref 96–106)
GFR calc Af Amer: 106 mL/min/{1.73_m2} (ref >59)
GFR, EST NON AFRICAN AMERICAN: 92 mL/min/{1.73_m2} (ref >59)
GLOBULIN, TOTAL: 2.5 g/dL (ref 1.5–4.5)
Glucose: 119 mg/dL — ABNORMAL HIGH (ref 65–99)
Potassium: 4.6 mmol/L (ref 3.5–5.2)
SODIUM: 143 mmol/L (ref 134–144)
Total Protein: 6.4 g/dL (ref 6.0–8.5)

## 2017-02-24 LAB — TSH: TSH: 3.66 u[IU]/mL (ref 0.450–4.500)

## 2017-02-25 ENCOUNTER — Other Ambulatory Visit: Payer: BC Managed Care – PPO

## 2017-02-25 LAB — BAYER DCA HB A1C WAIVED: HB A1C: 6.7 % (ref <7.0)

## 2017-02-25 NOTE — Addendum Note (Signed)
Addended by: Thana Ates on: 02/25/2017 10:17 AM   Modules accepted: Orders

## 2017-05-17 ENCOUNTER — Other Ambulatory Visit: Payer: Self-pay

## 2017-05-17 MED ORDER — HYDROCHLOROTHIAZIDE 25 MG PO TABS: 25.0000 mg | ORAL_TABLET | Freq: Every day | ORAL | 3 refills | Status: DC

## 2017-08-11 ENCOUNTER — Other Ambulatory Visit: Payer: Self-pay | Admitting: Family Medicine

## 2017-09-19 ENCOUNTER — Other Ambulatory Visit: Payer: Self-pay | Admitting: Family Medicine

## 2017-09-20 ENCOUNTER — Encounter: Payer: Self-pay | Admitting: Family Medicine

## 2017-09-20 ENCOUNTER — Ambulatory Visit: Payer: BC Managed Care – PPO | Admitting: Family Medicine

## 2017-09-20 VITALS — BP 131/83 | HR 87 | Temp 96.9°F | Ht 70.0 in | Wt 292.4 lb

## 2017-09-20 DIAGNOSIS — Z23 Encounter for immunization: Secondary | ICD-10-CM | POA: Diagnosis not present

## 2017-09-20 DIAGNOSIS — I10 Essential (primary) hypertension: Secondary | ICD-10-CM | POA: Diagnosis not present

## 2017-09-20 DIAGNOSIS — E119 Type 2 diabetes mellitus without complications: Secondary | ICD-10-CM | POA: Diagnosis not present

## 2017-09-20 DIAGNOSIS — Z6841 Body Mass Index (BMI) 40.0 and over, adult: Secondary | ICD-10-CM | POA: Diagnosis not present

## 2017-09-20 LAB — BAYER DCA HB A1C WAIVED: HB A1C (BAYER DCA - WAIVED): 8.7 % — ABNORMAL HIGH (ref <7.0)

## 2017-09-20 MED ORDER — LIRAGLUTIDE 18 MG/3ML ~~LOC~~ SOPN: 0.6000 mg | PEN_INJECTOR | Freq: Every day | SUBCUTANEOUS | 3 refills | Status: DC

## 2017-09-20 MED ORDER — HYDROCHLOROTHIAZIDE 25 MG PO TABS: 25.0000 mg | ORAL_TABLET | Freq: Every day | ORAL | 3 refills | Status: DC

## 2017-09-20 MED ORDER — INSULIN PEN NEEDLE 31G X 5 MM MISC: 1.0000 | Freq: Every day | 11 refills | Status: DC

## 2017-09-20 NOTE — Progress Notes (Signed)
   HPI  Patient presents today for chronic follow-up.  Patient was diagnosed with type 2 diabetes after labs in the last visit.  She states that she has made some moderate lifestyle changes, mainly diet. She has reduced her carbohydrate intake.  She is interested in losing weight.  She has had good medication compliance with HCTZ, needs refill.  PMH: Smoking status noted ROS: Per HPI  Objective: BP 131/83   Pulse 87   Temp (!) 96.9 F (36.1 C) (Oral)   Ht '5\' 10"'$  (1.778 m)   Wt 292 lb 6.4 oz (132.6 kg)   BMI 41.96 kg/m  Gen: NAD, alert, cooperative with exam HEENT: NCAT, EOMI, PERRL CV: RRR, good S1/S2, no murmur Resp: CTABL, no wheezes, non-labored Ext: No edema, warm Neuro: Alert and oriented, No gross deficits  Diabetic Foot Exam - Simple   Simple Foot Form Diabetic Foot exam was performed with the following findings:  Yes 09/20/2017  9:36 AM  Visual Inspection No deformities, no ulcerations, no other skin breakdown bilaterally:  Yes Sensation Testing Intact to touch and monofilament testing bilaterally:  Yes Pulse Check Posterior Tibialis and Dorsalis pulse intact bilaterally:  Yes Comments      Assessment and plan:  #Type 2 diabetes Patient with previously reasonably controlled A1c, however she is interested in weight loss. Started big toes up. Patient feels comfortable with injections Discussed titration, return to clinic in 3 months.  #Obesity Starting Victoza Patient is also made some minimal lifestyle ranges. Return to clinic in 3 months  #Hypertension Well-controlled on HCTZ, refilled Repeat labs, patient previously with slightly elevated LFT  Orders Placed This Encounter  Procedures  . TSH  . CBC with Differential/Platelet  . CMP14+EGFR  . Bayer DCA Hb A1c Waived    Meds ordered this encounter  Medications  . hydrochlorothiazide (HYDRODIURIL) 25 MG tablet    Sig: Take 1 tablet (25 mg total) daily by mouth.    Dispense:  90 tablet   Refill:  3    Needs to be seen before next refill  . liraglutide (VICTOZA) 18 MG/3ML SOPN    Sig: Inject 0.1-0.3 mLs (0.6-1.8 mg total) daily into the skin. 0.6 mg daily for 1 week, week 2 increase to 1.2, then increase to 1.8 mg    Dispense:  3 pen    Refill:  3  . Insulin Pen Needle (B-D UF III MINI PEN NEEDLES) 31G X 5 MM MISC    Sig: 1 Device daily by Does not apply route.    Dispense:  30 each    Refill:  [place], MD [place] 09/20/2017, 10:07 AM

## 2017-09-20 NOTE — Patient Instructions (Signed)
Great to see you!  Start victoza, 0.6 mg daily for 1 week then 1.2 mg daily for a week, then 1.8 mg daily.   Come back in 3 months for re-check of your A1C

## 2017-09-21 LAB — CBC WITH DIFFERENTIAL/PLATELET
BASOS ABS: 0 10*3/uL (ref 0.0–0.2)
Basos: 0 %
EOS (ABSOLUTE): 0.1 10*3/uL (ref 0.0–0.4)
EOS: 2 %
HEMATOCRIT: 43.1 % (ref 34.0–46.6)
HEMOGLOBIN: 14.7 g/dL (ref 11.1–15.9)
IMMATURE GRANS (ABS): 0 10*3/uL (ref 0.0–0.1)
Immature Granulocytes: 0 %
LYMPHS ABS: 2 10*3/uL (ref 0.7–3.1)
LYMPHS: 37 %
MCH: 30.4 pg (ref 26.6–33.0)
MCHC: 34.1 g/dL (ref 31.5–35.7)
MCV: 89 fL (ref 79–97)
MONOCYTES: 7 %
Monocytes Absolute: 0.4 10*3/uL (ref 0.1–0.9)
NEUTROS ABS: 3 10*3/uL (ref 1.4–7.0)
Neutrophils: 54 %
Platelets: 236 10*3/uL (ref 150–379)
RBC: 4.83 x10E6/uL (ref 3.77–5.28)
RDW: 14 % (ref 12.3–15.4)
WBC: 5.5 10*3/uL (ref 3.4–10.8)

## 2017-09-21 LAB — CMP14+EGFR
ALBUMIN: 4 g/dL (ref 3.5–5.5)
ALT: 123 IU/L — ABNORMAL HIGH (ref 0–32)
AST: 61 IU/L — ABNORMAL HIGH (ref 0–40)
Albumin/Globulin Ratio: 1.5 (ref 1.2–2.2)
Alkaline Phosphatase: 77 IU/L (ref 39–117)
BUN / CREAT RATIO: 11 (ref 9–23)
BUN: 9 mg/dL (ref 6–24)
Bilirubin Total: 0.4 mg/dL (ref 0.0–1.2)
CO2: 26 mmol/L (ref 20–29)
CREATININE: 0.84 mg/dL (ref 0.57–1.00)
Calcium: 9 mg/dL (ref 8.7–10.2)
Chloride: 98 mmol/L (ref 96–106)
GFR calc Af Amer: 93 mL/min/{1.73_m2} (ref >59)
GFR, EST NON AFRICAN AMERICAN: 81 mL/min/{1.73_m2} (ref >59)
GLOBULIN, TOTAL: 2.7 g/dL (ref 1.5–4.5)
GLUCOSE: 217 mg/dL — AB (ref 65–99)
Potassium: 3.6 mmol/L (ref 3.5–5.2)
SODIUM: 137 mmol/L (ref 134–144)
Total Protein: 6.7 g/dL (ref 6.0–8.5)

## 2017-09-21 LAB — TSH: TSH: 2.77 u[IU]/mL (ref 0.450–4.500)

## 2017-12-24 ENCOUNTER — Ambulatory Visit: Payer: Self-pay | Admitting: Family Medicine

## 2018-01-13 ENCOUNTER — Encounter: Payer: Self-pay | Admitting: Family Medicine

## 2018-01-13 ENCOUNTER — Ambulatory Visit: Payer: BC Managed Care – PPO | Admitting: Family Medicine

## 2018-01-13 VITALS — BP 119/83 | HR 97 | Temp 96.7°F | Ht 70.0 in | Wt 283.2 lb

## 2018-01-13 DIAGNOSIS — E119 Type 2 diabetes mellitus without complications: Secondary | ICD-10-CM

## 2018-01-13 DIAGNOSIS — R945 Abnormal results of liver function studies: Secondary | ICD-10-CM | POA: Diagnosis not present

## 2018-01-13 DIAGNOSIS — I10 Essential (primary) hypertension: Secondary | ICD-10-CM

## 2018-01-13 DIAGNOSIS — R7989 Other specified abnormal findings of blood chemistry: Secondary | ICD-10-CM

## 2018-01-13 LAB — CMP14+EGFR
A/G RATIO: 1.6 (ref 1.2–2.2)
ALT: 65 IU/L — AB (ref 0–32)
AST: 36 IU/L (ref 0–40)
Albumin: 3.9 g/dL (ref 3.5–5.5)
Alkaline Phosphatase: 60 IU/L (ref 39–117)
BUN/Creatinine Ratio: 12 (ref 9–23)
BUN: 9 mg/dL (ref 6–24)
Bilirubin Total: 0.3 mg/dL (ref 0.0–1.2)
CALCIUM: 8.9 mg/dL (ref 8.7–10.2)
CO2: 24 mmol/L (ref 20–29)
CREATININE: 0.76 mg/dL (ref 0.57–1.00)
Chloride: 100 mmol/L (ref 96–106)
GFR calc non Af Amer: 91 mL/min/{1.73_m2} (ref >59)
GFR, EST AFRICAN AMERICAN: 105 mL/min/{1.73_m2} (ref >59)
GLOBULIN, TOTAL: 2.4 g/dL (ref 1.5–4.5)
Glucose: 119 mg/dL — ABNORMAL HIGH (ref 65–99)
Potassium: 3.8 mmol/L (ref 3.5–5.2)
Sodium: 141 mmol/L (ref 134–144)
TOTAL PROTEIN: 6.3 g/dL (ref 6.0–8.5)

## 2018-01-13 LAB — BAYER DCA HB A1C WAIVED: HB A1C (BAYER DCA - WAIVED): 6.5 % (ref <7.0)

## 2018-01-13 MED ORDER — LIRAGLUTIDE 18 MG/3ML ~~LOC~~ SOPN: 1.8000 mg | PEN_INJECTOR | Freq: Every day | SUBCUTANEOUS | 3 refills | Status: DC

## 2018-01-13 NOTE — Progress Notes (Signed)
   HPI  Patient presents today here to follow-up for diabetes.  Patient started Victoza last visit, she is tolerating 1.8 mg daily. She is not checking blood sugars and denies low blood sugars.  She is watching her diet moderately.  We discussed her liver enzymes, she denies any frequent alcohol or Tylenol use.  She also reports good medication compliance with HCTZ.  PMH: Smoking status noted ROS: Per HPI  Objective: BP 119/83   Pulse 97   Temp (!) 96.7 F (35.9 C) (Oral)   Ht '5\' 10"'$  (1.778 m)   Wt 283 lb 3.2 oz (128.5 kg)   BMI 40.63 kg/m  Gen: NAD, alert, cooperative with exam HEENT: NCAT CV: RRR, good S1/S2, no murmur Resp: CTABL, no wheezes, non-labored Ext: No edema, warm Neuro: Alert and oriented, No gross deficits  Assessment and plan:  #Type 2 diabetes Continue Victoza A1c pending, expect good control, previously 8.7  #Elevated LFTs Likely fatty liver Repeat labs, if stable or elevated would recommend ultrasound If reduced continue to follow  #Hypertension Well-controlled on HCTZ No changes    Orders Placed This Encounter  Procedures  . CMP14+EGFR  . Bayer DCA Hb A1c Waived    Meds ordered this encounter  Medications  . liraglutide (VICTOZA) 18 MG/3ML SOPN    Sig: Inject 0.3 mLs (1.8 mg total) into the skin daily.    Dispense:  9 pen    Refill:  [place], MD [facility] 01/13/2018, 8:18 AM

## 2018-01-13 NOTE — Patient Instructions (Signed)
Great to see you!   

## 2018-01-17 ENCOUNTER — Encounter: Payer: Self-pay | Admitting: Family Medicine

## 2018-06-25 ENCOUNTER — Other Ambulatory Visit: Payer: Self-pay | Admitting: *Deleted

## 2018-06-25 MED ORDER — LIRAGLUTIDE 18 MG/3ML ~~LOC~~ SOPN: 1.8000 mg | PEN_INJECTOR | Freq: Every day | SUBCUTANEOUS | 0 refills | Status: DC

## 2018-07-25 ENCOUNTER — Other Ambulatory Visit: Payer: Self-pay | Admitting: Family Medicine

## 2018-07-25 NOTE — Telephone Encounter (Signed)
Last seen 01/13/18  Dr Wendi Snipes

## 2018-07-25 NOTE — Telephone Encounter (Signed)
Last refill without being seen 

## 2018-09-25 ENCOUNTER — Other Ambulatory Visit: Payer: Self-pay | Admitting: *Deleted

## 2018-09-25 MED ORDER — HYDROCHLOROTHIAZIDE 25 MG PO TABS: 25.0000 mg | ORAL_TABLET | Freq: Every day | ORAL | 0 refills | Status: DC

## 2018-10-09 ENCOUNTER — Other Ambulatory Visit: Payer: Self-pay | Admitting: Nurse Practitioner

## 2018-10-10 ENCOUNTER — Other Ambulatory Visit: Payer: Self-pay | Admitting: Nurse Practitioner

## 2018-10-10 NOTE — Telephone Encounter (Signed)
Former [employer]. NT[employer]. 30 days given 09/25/18

## 2018-10-10 NOTE — Telephone Encounter (Signed)
lmtcb

## 2018-10-13 ENCOUNTER — Encounter: Payer: Self-pay | Admitting: Family Medicine

## 2018-10-13 ENCOUNTER — Ambulatory Visit: Payer: BC Managed Care – PPO | Admitting: Family Medicine

## 2018-10-13 VITALS — BP 148/92 | HR 81 | Temp 97.0°F | Ht 70.0 in | Wt 287.5 lb

## 2018-10-13 DIAGNOSIS — Z0001 Encounter for general adult medical examination with abnormal findings: Secondary | ICD-10-CM | POA: Diagnosis not present

## 2018-10-13 DIAGNOSIS — E119 Type 2 diabetes mellitus without complications: Secondary | ICD-10-CM

## 2018-10-13 DIAGNOSIS — Z0000 Encounter for general adult medical examination without abnormal findings: Secondary | ICD-10-CM

## 2018-10-13 DIAGNOSIS — Z23 Encounter for immunization: Secondary | ICD-10-CM

## 2018-10-13 DIAGNOSIS — Z1211 Encounter for screening for malignant neoplasm of colon: Secondary | ICD-10-CM

## 2018-10-13 DIAGNOSIS — Z1212 Encounter for screening for malignant neoplasm of rectum: Secondary | ICD-10-CM | POA: Diagnosis not present

## 2018-10-13 DIAGNOSIS — I10 Essential (primary) hypertension: Secondary | ICD-10-CM

## 2018-10-13 DIAGNOSIS — Z6841 Body Mass Index (BMI) 40.0 and over, adult: Secondary | ICD-10-CM

## 2018-10-13 LAB — BAYER DCA HB A1C WAIVED: HB A1C: 7.7 % — AB (ref <7.0)

## 2018-10-13 MED ORDER — LISINOPRIL 5 MG PO TABS: 5.0000 mg | ORAL_TABLET | Freq: Every day | ORAL | 3 refills | Status: DC

## 2018-10-13 MED ORDER — HYDROCHLOROTHIAZIDE 25 MG PO TABS: 25.0000 mg | ORAL_TABLET | Freq: Every day | ORAL | 3 refills | Status: DC

## 2018-10-13 MED ORDER — LIRAGLUTIDE 18 MG/3ML ~~LOC~~ SOPN: PEN_INJECTOR | SUBCUTANEOUS | 3 refills | Status: DC

## 2018-10-13 NOTE — Progress Notes (Signed)
Subjective:    Patient ID: Brandy Hernandez, female    DOB: 1966-04-08, 52 y.o.   MRN: [id number]  Chief Complaint:  Medical Management of Chronic Issues and Annual Exam   HPI: Brandy Hernandez is a 52 y.o. female presenting on 10/13/2018 for Medical Management of Chronic Issues and Annual Exam   1. Essential hypertension  Complaint with meds - Yes Checking BP at home  - noe Exercising Regularly - No Watching Salt intake - Yes Pertinent ROS:  Headache - No Chest pain - No Dyspnea - No Palpitations - No LE edema - intermittent  They report good compliance with medications and can restate their regimen by memory. No medication side effects.  BP Readings from Last 3 Encounters:  10/13/18 (!) 148/92  01/13/18 119/83  09/20/17 131/83     2. Type 2 diabetes mellitus without complication, without long-term current use of insulin (HCC)  Diabetes mellitus 2 Compliant with meds - Is compliant, but ran out 3 weeks ago Checking CBGs? Yes  Fasting avg - 100  Postprandial average - 130 Exercising regularly? - No Watching carbohydrate intake? - No Neuropathy ? - No Hypoglycemic events - No Pertinent ROS:  Polyuria - No Polydipsia - No Vision problems - No   3. Morbid obesity with BMI of 40.0-44.9, adult ([place])  Does not diet and exercise on a regular basis, states she has been trying to do better.     Relevant past medical, surgical, family, and social history reviewed and updated as indicated.  Allergies and medications reviewed and updated.   Past Medical History:  Diagnosis Date  . Basal cell carcinoma   . Diabetes mellitus without complication ([place])   . Hypertension     Past Surgical History:  Procedure Laterality Date  . ABDOMINAL HYSTERECTOMY    . SKIN CANCER EXCISION  2018    Social History   Socioeconomic History  . Marital status: Married    Spouse name: Not on file  . Number of children: Not on file  . Years of education: Not on file  . Highest  education level: Not on file  Occupational History  . Not on file  Social Needs  . Financial resource strain: Not on file  . Food insecurity:    Worry: Not on file    Inability: Not on file  . Transportation needs:    Medical: Not on file    Non-medical: Not on file  Tobacco Use  . Smoking status: Former Smoker    Types: Cigarettes    Last attempt to quit: 11/05/1992    Years since quitting: 25.9  . Smokeless tobacco: Never Used  Substance and Sexual Activity  . Alcohol use: Yes    Comment: occasional  . Drug use: No  . Sexual activity: Not on file  Lifestyle  . Physical activity:    Days per week: Not on file    Minutes per session: Not on file  . Stress: Not on file  Relationships  . Social connections:    Talks on phone: Not on file    Gets together: Not on file    Attends religious service: Not on file    Active member of club or organization: Not on file    Attends meetings of clubs or organizations: Not on file    Relationship status: Not on file  . Intimate partner violence:    Fear of current or ex partner: Not on file    Emotionally abused:  Not on file    Physically abused: Not on file    Forced sexual activity: Not on file  Other Topics Concern  . Not on file  Social History Narrative  . Not on file    Outpatient Encounter Medications as of 10/13/2018  Medication Sig  . hydrochlorothiazide (HYDRODIURIL) 25 MG tablet Take 1 tablet (25 mg total) by mouth daily. (Needs to be seen before next refill)  . Insulin Pen Needle (B-D UF III MINI PEN NEEDLES) 31G X 5 MM MISC 1 Device daily by Does not apply route.  . liraglutide (VICTOZA) 18 MG/3ML SOPN INJECT 1.8 MG UNDER THE SKIN ONCE DAILY  . [DISCONTINUED] hydrochlorothiazide (HYDRODIURIL) 25 MG tablet Take 1 tablet (25 mg total) by mouth daily. (Needs to be seen before next refill)  . [DISCONTINUED] VICTOZA 18 MG/3ML SOPN INJECT 1.8 MG UNDER THE SKIN ONCE DAILY  . lisinopril (PRINIVIL,ZESTRIL) 5 MG tablet Take 1  tablet (5 mg total) by mouth daily.   No facility-administered encounter medications on file as of 10/13/2018.     No Known Allergies  Review of Systems  Constitutional: Negative for activity change, appetite change, chills, fatigue, fever and unexpected weight change.  Eyes: Negative for photophobia and visual disturbance.  Respiratory: Negative for chest tightness and shortness of breath.   Cardiovascular: Positive for leg swelling. Negative for chest pain and palpitations.  Gastrointestinal: Negative for abdominal pain, constipation, diarrhea, nausea and vomiting.  Endocrine: Negative for polydipsia, polyphagia and polyuria.  Musculoskeletal: Negative for arthralgias and myalgias.  Neurological: Negative for dizziness, tremors, seizures, syncope, facial asymmetry, speech difficulty, weakness, light-headedness, numbness and headaches.  Psychiatric/Behavioral: Negative for confusion.  All other systems reviewed and are negative.       Objective:    BP (!) 148/92   Pulse 81   Temp (!) 97 F (36.1 C) (Oral)   Ht 5' 10"  (1.778 m)   Wt 287 lb 8 oz (130.4 kg)   BMI 41.25 kg/m    Wt Readings from Last 3 Encounters:  10/13/18 287 lb 8 oz (130.4 kg)  01/13/18 283 lb 3.2 oz (128.5 kg)  09/20/17 292 lb 6.4 oz (132.6 kg)    Physical Exam  Constitutional: She is oriented to person, place, and time. She appears well-developed and well-nourished. She is cooperative. No distress.  HENT:  Head: Normocephalic and atraumatic.  Right Ear: Hearing, tympanic membrane, external ear and ear canal normal.  Left Ear: Hearing, tympanic membrane, external ear and ear canal normal.  Nose: Nose normal.  Mouth/Throat: Uvula is midline, oropharynx is clear and moist and mucous membranes are normal.  Eyes: Pupils are equal, round, and reactive to light. Conjunctivae, EOM and lids are normal.  Neck: Normal range of motion. Neck supple. No JVD present. Carotid bruit is not present. No tracheal  deviation present. No thyroid mass and no thyromegaly present.  Cardiovascular: Normal rate, regular rhythm, normal heart sounds and intact distal pulses. PMI is not displaced. Exam reveals no gallop and no friction rub.  No murmur heard. Pulses:      Dorsalis pedis pulses are 2+ on the right side, and 2+ on the left side.       Posterior tibial pulses are 2+ on the right side, and 2+ on the left side.  Pulmonary/Chest: Effort normal and breath sounds normal. No respiratory distress.  Abdominal: Soft. Normal appearance and normal aorta. There is no tenderness.  Feet:  Right Foot:  Skin Integrity: Negative for ulcer, blister, skin breakdown, erythema,  warmth, callus or dry skin.  Left Foot:  Skin Integrity: Negative for ulcer, blister, skin breakdown, erythema, warmth, callus or dry skin.  Lymphadenopathy:    She has no cervical adenopathy.  Neurological: She is alert and oriented to person, place, and time. She has normal strength and normal reflexes. No cranial nerve deficit or sensory deficit.  Skin: Skin is warm and dry. Capillary refill takes less than 2 seconds.  Psychiatric: She has a normal mood and affect. Her speech is normal and behavior is normal. Judgment and thought content normal. Cognition and memory are normal.  Nursing note and vitals reviewed.   Results for orders placed or performed in visit on 01/13/18  CMP14+EGFR  Result Value Ref Range   Glucose 119 (H) 65 - 99 mg/dL   BUN 9 6 - 24 mg/dL   Creatinine, Ser 0.76 0.57 - 1.00 mg/dL   GFR calc non Af Amer 91 >59 mL/min/1.73   GFR calc Af Amer 105 >59 mL/min/1.73   BUN/Creatinine Ratio 12 9 - 23   Sodium 141 134 - 144 mmol/L   Potassium 3.8 3.5 - 5.2 mmol/L   Chloride 100 96 - 106 mmol/L   CO2 24 20 - 29 mmol/L   Calcium 8.9 8.7 - 10.2 mg/dL   Total Protein 6.3 6.0 - 8.5 g/dL   Albumin 3.9 3.5 - 5.5 g/dL   Globulin, Total 2.4 1.5 - 4.5 g/dL   Albumin/Globulin Ratio 1.6 1.2 - 2.2   Bilirubin Total 0.3 0.0 - 1.2  mg/dL   Alkaline Phosphatase 60 39 - 117 IU/L   AST 36 0 - 40 IU/L   ALT 65 (H) 0 - 32 IU/L  Bayer DCA Hb A1c Waived  Result Value Ref Range   HB A1C (BAYER DCA - WAIVED) 6.5 <7.0 %       Pertinent labs & imaging results that were available during my care of the patient were reviewed by me and considered in my medical decision making.  Assessment & Plan:  Lydiana was seen today for medical management of chronic issues.  Diagnoses and all orders for this visit:  Essential hypertension Blood pressure elevated in office today. Pt has been taking over the counter sinus and congestion medication, pt educated on proper over the counter medications for high blood pressure patients. Pt is not on an ACEI or ARB, will add lisinopril today. DASH diet. Weight management and exercise encouraged. Pt to monitor blood pressure at home and bring recordings to next visit. Return in 2 weeks for reevaluation of blood pressure and CMP. -     CMP14+EGFR -     CBC with Differential/Platelet -     Lipid panel -     TSH -     Microalbumin / creatinine urine ratio -     hydrochlorothiazide (HYDRODIURIL) 25 MG tablet; Take 1 tablet (25 mg total) by mouth daily. (Needs to be seen before next refill) -     lisinopril (PRINIVIL,ZESTRIL) 5 MG tablet; Take 1 tablet (5 mg total) by mouth daily.  Type 2 diabetes mellitus without complication, without long-term current use of insulin (HCC) A1C 7.7 in office today. Pt has been out of Victoza for 3 weeks. Will recheck A1C in 3 months. Diet and exercise encouraged.  -     CMP14+EGFR -     Lipid panel -     Microalbumin / creatinine urine ratio -     Bayer DCA Hb A1c Waived -     liraglutide (  VICTOZA) 18 MG/3ML SOPN; INJECT 1.8 MG UNDER THE SKIN ONCE DAILY  Morbid obesity with BMI of 40.0-44.9, adult (HCC) Diet and exercise encouraged.   Screening for colorectal cancer  Maternal history of colorectal cancer, will refer for colonoscopy.  -     Ambulatory referral to  Gastroenterology  Need for immunization against influenza -     Flu Vaccine QUAD 36+ mos IM  Other orders -     Pneumococcal polysaccharide vaccine 23-valent greater than or equal to 2yo subcutaneous/IM -     Tdap vaccine greater than or equal to 7yo IM  Annual physical exam - CMP14+EGFR - CBC with Differential/Platelet - Lipid panel - TSH - Microalbumin / creatinine urine ratio - Bayer DCA Hb A1c Waived - Ambulatory referral to Gastroenterology - Pneumococcal polysaccharide vaccine 23-valent greater than or equal to 2yo subcutaneous/IM - Tdap vaccine greater than or equal to 7yo IM  Records from GYN (mammogram and PAP) and opthalmolog requested.   Continue all other maintenance medications.  Follow up plan: Return in about 2 weeks (around 10/27/2018), or if symptoms worsen or fail to improve.  Educational handout given for DASH diet  The above assessment and management plan was discussed with the patient. The patient verbalized understanding of and has agreed to the management plan. Patient is aware to call the clinic if symptoms persist or worsen. Patient is aware when to return to the clinic for a follow-up visit. Patient educated on when it is appropriate to go to the emergency department.   Monia Pouch, FNP-C [facility] [phone]

## 2018-10-13 NOTE — Patient Instructions (Signed)
DASH Eating Plan DASH stands for "Dietary Approaches to Stop Hypertension." The DASH eating plan is a healthy eating plan that has been shown to reduce high blood pressure (hypertension). It may also reduce your risk for type 2 diabetes, heart disease, and stroke. The DASH eating plan may also help with weight loss. What are tips for following this plan? General guidelines  Avoid eating more than 2,300 mg (milligrams) of salt (sodium) a day. If you have hypertension, you may need to reduce your sodium intake to 1,500 mg a day.  Limit alcohol intake to no more than 1 drink a day for nonpregnant women and 2 drinks a day for men. One drink equals 12 oz of beer, 5 oz of wine, or 1 oz of hard liquor.  Work with your health care provider to maintain a healthy body weight or to lose weight. Ask what an ideal weight is for you.  Get at least 30 minutes of exercise that causes your heart to beat faster (aerobic exercise) most days of the week. Activities may include walking, swimming, or biking.  Work with your health care provider or diet and nutrition specialist (dietitian) to adjust your eating plan to your individual calorie needs. Reading food labels  Check food labels for the amount of sodium per serving. Choose foods with less than 5 percent of the Daily Value of sodium. Generally, foods with less than 300 mg of sodium per serving fit into this eating plan.  To find whole grains, look for the word "whole" as the first word in the ingredient list. Shopping  Buy products labeled as "low-sodium" or "no salt added."  Buy fresh foods. Avoid canned foods and premade or frozen meals. Cooking  Avoid adding salt when cooking. Use salt-free seasonings or herbs instead of table salt or sea salt. Check with your health care provider or pharmacist before using salt substitutes.  Do not fry foods. Cook foods using healthy methods such as baking, boiling, grilling, and broiling instead.  Cook with  heart-healthy oils, such as olive, canola, soybean, or sunflower oil. Meal planning   Eat a balanced diet that includes: ? 5 or more servings of fruits and vegetables each day. At each meal, try to fill half of your plate with fruits and vegetables. ? Up to 6-8 servings of whole grains each day. ? Less than 6 oz of lean meat, poultry, or fish each day. A 3-oz serving of meat is about the same size as a deck of cards. One egg equals 1 oz. ? 2 servings of low-fat dairy each day. ? A serving of nuts, seeds, or beans 5 times each week. ? Heart-healthy fats. Healthy fats called Omega-3 fatty acids are found in foods such as flaxseeds and coldwater fish, like sardines, salmon, and mackerel.  Limit how much you eat of the following: ? Canned or prepackaged foods. ? Food that is high in trans fat, such as fried foods. ? Food that is high in saturated fat, such as fatty meat. ? Sweets, desserts, sugary drinks, and other foods with added sugar. ? Full-fat dairy products.  Do not salt foods before eating.  Try to eat at least 2 vegetarian meals each week.  Eat more home-cooked food and less restaurant, buffet, and fast food.  When eating at a restaurant, ask that your food be prepared with less salt or no salt, if possible. What foods are recommended? The items listed may not be a complete list. Talk with your dietitian about what   dietary choices are best for you. Grains Whole-grain or whole-wheat bread. Whole-grain or whole-wheat pasta. Brown rice. Oatmeal. Quinoa. Bulgur. Whole-grain and low-sodium cereals. Pita bread. Low-fat, low-sodium crackers. Whole-wheat flour tortillas. Vegetables Fresh or frozen vegetables (raw, steamed, roasted, or grilled). Low-sodium or reduced-sodium tomato and vegetable juice. Low-sodium or reduced-sodium tomato sauce and tomato paste. Low-sodium or reduced-sodium canned vegetables. Fruits All fresh, dried, or frozen fruit. Canned fruit in natural juice (without  added sugar). Meat and other protein foods Skinless chicken or turkey. Ground chicken or turkey. Pork with fat trimmed off. Fish and seafood. Egg whites. Dried beans, peas, or lentils. Unsalted nuts, nut butters, and seeds. Unsalted canned beans. Lean cuts of beef with fat trimmed off. Low-sodium, lean deli meat. Dairy Low-fat (1%) or fat-free (skim) milk. Fat-free, low-fat, or reduced-fat cheeses. Nonfat, low-sodium ricotta or cottage cheese. Low-fat or nonfat yogurt. Low-fat, low-sodium cheese. Fats and oils Soft margarine without trans fats. Vegetable oil. Low-fat, reduced-fat, or light mayonnaise and salad dressings (reduced-sodium). Canola, safflower, olive, soybean, and sunflower oils. Avocado. Seasoning and other foods Herbs. Spices. Seasoning mixes without salt. Unsalted popcorn and pretzels. Fat-free sweets. What foods are not recommended? The items listed may not be a complete list. Talk with your dietitian about what dietary choices are best for you. Grains Baked goods made with fat, such as croissants, muffins, or some breads. Dry pasta or rice meal packs. Vegetables Creamed or fried vegetables. Vegetables in a cheese sauce. Regular canned vegetables (not low-sodium or reduced-sodium). Regular canned tomato sauce and paste (not low-sodium or reduced-sodium). Regular tomato and vegetable juice (not low-sodium or reduced-sodium). Pickles. Olives. Fruits Canned fruit in a light or heavy syrup. Fried fruit. Fruit in cream or butter sauce. Meat and other protein foods Fatty cuts of meat. Ribs. Fried meat. Bacon. Sausage. Bologna and other processed lunch meats. Salami. Fatback. Hotdogs. Bratwurst. Salted nuts and seeds. Canned beans with added salt. Canned or smoked fish. Whole eggs or egg yolks. Chicken or turkey with skin. Dairy Whole or 2% milk, cream, and half-and-half. Whole or full-fat cream cheese. Whole-fat or sweetened yogurt. Full-fat cheese. Nondairy creamers. Whipped toppings.  Processed cheese and cheese spreads. Fats and oils Butter. Stick margarine. Lard. Shortening. Ghee. Bacon fat. Tropical oils, such as coconut, palm kernel, or palm oil. Seasoning and other foods Salted popcorn and pretzels. Onion salt, garlic salt, seasoned salt, table salt, and sea salt. Worcestershire sauce. Tartar sauce. Barbecue sauce. Teriyaki sauce. Soy sauce, including reduced-sodium. Steak sauce. Canned and packaged gravies. Fish sauce. Oyster sauce. Cocktail sauce. Horseradish that you find on the shelf. Ketchup. Mustard. Meat flavorings and tenderizers. Bouillon cubes. Hot sauce and Tabasco sauce. Premade or packaged marinades. Premade or packaged taco seasonings. Relishes. Regular salad dressings. Where to find more information:  National Heart, Lung, and Blood Institute: www.nhlbi.nih.gov  American Heart Association: www.heart.org Summary  The DASH eating plan is a healthy eating plan that has been shown to reduce high blood pressure (hypertension). It may also reduce your risk for type 2 diabetes, heart disease, and stroke.  With the DASH eating plan, you should limit salt (sodium) intake to 2,300 mg a day. If you have hypertension, you may need to reduce your sodium intake to 1,500 mg a day.  When on the DASH eating plan, aim to eat more fresh fruits and vegetables, whole grains, lean proteins, low-fat dairy, and heart-healthy fats.  Work with your health care provider or diet and nutrition specialist (dietitian) to adjust your eating plan to your individual   calorie needs. This information is not intended to replace advice given to you by your health care provider. Make sure you discuss any questions you have with your health care provider. Document Released: 10/11/2011 Document Revised: 10/15/2016 Document Reviewed: 10/15/2016 Elsevier Interactive Patient Education  2018 [employer]  

## 2018-10-14 LAB — CBC WITH DIFFERENTIAL/PLATELET
Basophils Absolute: 0 10*3/uL (ref 0.0–0.2)
Basos: 1 %
EOS (ABSOLUTE): 0.3 10*3/uL (ref 0.0–0.4)
EOS: 5 %
HEMATOCRIT: 43.5 % (ref 34.0–46.6)
Hemoglobin: 14 g/dL (ref 11.1–15.9)
Immature Grans (Abs): 0 10*3/uL (ref 0.0–0.1)
Immature Granulocytes: 0 %
LYMPHS ABS: 1.9 10*3/uL (ref 0.7–3.1)
Lymphs: 35 %
MCH: 28.6 pg (ref 26.6–33.0)
MCHC: 32.2 g/dL (ref 31.5–35.7)
MCV: 89 fL (ref 79–97)
Monocytes Absolute: 0.5 10*3/uL (ref 0.1–0.9)
Monocytes: 8 %
Neutrophils Absolute: 2.7 10*3/uL (ref 1.4–7.0)
Neutrophils: 51 %
Platelets: 262 10*3/uL (ref 150–450)
RBC: 4.9 x10E6/uL (ref 3.77–5.28)
RDW: 13.4 % (ref 12.3–15.4)
WBC: 5.4 10*3/uL (ref 3.4–10.8)

## 2018-10-14 LAB — LIPID PANEL
CHOL/HDL RATIO: 6.1 ratio — AB (ref 0.0–4.4)
Cholesterol, Total: 189 mg/dL (ref 100–199)
HDL: 31 mg/dL — ABNORMAL LOW (ref >39)
LDL Calculated: 132 mg/dL — ABNORMAL HIGH (ref 0–99)
TRIGLYCERIDES: 129 mg/dL (ref 0–149)
VLDL Cholesterol Cal: 26 mg/dL (ref 5–40)

## 2018-10-14 LAB — CMP14+EGFR
A/G RATIO: 1.4 (ref 1.2–2.2)
ALK PHOS: 74 IU/L (ref 39–117)
ALT: 46 IU/L — ABNORMAL HIGH (ref 0–32)
AST: 25 IU/L (ref 0–40)
Albumin: 3.8 g/dL (ref 3.5–5.5)
BUN / CREAT RATIO: 13 (ref 9–23)
BUN: 10 mg/dL (ref 6–24)
Bilirubin Total: 0.4 mg/dL (ref 0.0–1.2)
CHLORIDE: 99 mmol/L (ref 96–106)
CO2: 25 mmol/L (ref 20–29)
Calcium: 9.1 mg/dL (ref 8.7–10.2)
Creatinine, Ser: 0.78 mg/dL (ref 0.57–1.00)
GFR calc Af Amer: 101 mL/min/{1.73_m2} (ref >59)
GFR calc non Af Amer: 88 mL/min/{1.73_m2} (ref >59)
GLOBULIN, TOTAL: 2.7 g/dL (ref 1.5–4.5)
Glucose: 199 mg/dL — ABNORMAL HIGH (ref 65–99)
POTASSIUM: 3.8 mmol/L (ref 3.5–5.2)
Sodium: 140 mmol/L (ref 134–144)
Total Protein: 6.5 g/dL (ref 6.0–8.5)

## 2018-10-14 LAB — MICROALBUMIN / CREATININE URINE RATIO
Creatinine, Urine: 103.1 mg/dL
MICROALBUM., U, RANDOM: 4.5 ug/mL
Microalb/Creat Ratio: 4.4 mg/g creat (ref 0.0–30.0)

## 2018-10-14 LAB — TSH: TSH: 3.47 u[IU]/mL (ref 0.450–4.500)

## 2018-10-23 ENCOUNTER — Encounter: Payer: Self-pay | Admitting: Family Medicine

## 2018-10-23 DIAGNOSIS — Z90710 Acquired absence of both cervix and uterus: Secondary | ICD-10-CM | POA: Insufficient documentation

## 2018-10-23 DIAGNOSIS — Z98891 History of uterine scar from previous surgery: Secondary | ICD-10-CM | POA: Insufficient documentation

## 2018-10-27 ENCOUNTER — Encounter: Payer: Self-pay | Admitting: Family Medicine

## 2018-10-27 ENCOUNTER — Ambulatory Visit: Payer: BC Managed Care – PPO | Admitting: Family Medicine

## 2018-10-27 VITALS — BP 132/84 | HR 82 | Temp 97.4°F | Ht 70.0 in | Wt 284.0 lb

## 2018-10-27 DIAGNOSIS — I10 Essential (primary) hypertension: Secondary | ICD-10-CM | POA: Diagnosis not present

## 2018-10-27 NOTE — Progress Notes (Signed)
   Subjective:    Patient ID: Brandy Hernandez, female    DOB: 06/17/1966, 52 y.o.   MRN: [id number]  Chief Complaint:  Hypertension (followup)   HPI: Brandy Hernandez is a 52 y.o. female presenting on 10/27/2018 for Hypertension (followup)   1. Essential hypertension   Here for 2 week follow up after initiating Lisinopril. Complaint with meds - Yes Checking BP at home ranging 130/80 Exercising Regularly - No Watching Salt intake - Yes Pertinent ROS:  Headache - No Chest pain - No Dyspnea - No Palpitations - No LE edema - No They report good compliance with medications and can restate their regimen by memory. No medication side effects.  BP Readings from Last 3 Encounters:  10/27/18 132/84  10/13/18 (!) 148/92  01/13/18 119/83      Relevant past medical, surgical, family, and social history reviewed and updated as indicated.  Allergies and medications reviewed and updated.   Past Medical History:  Diagnosis Date  . Basal cell carcinoma   . Diabetes mellitus without complication (HCC)   . Hypertension     Past Surgical History:  Procedure Laterality Date  . ABDOMINAL HYSTERECTOMY    . CESAREAN SECTION    . ENDOMETRIAL ABLATION    . SKIN CANCER EXCISION  2018    Social History   Socioeconomic History  . Marital status: Married    Spouse name: Not on file  . Number of children: Not on file  . Years of education: Not on file  . Highest education level: Not on file  Occupational History  . Not on file  Social Needs  . Financial resource strain: Not on file  . Food insecurity:    Worry: Not on file    Inability: Not on file  . Transportation needs:    Medical: Not on file    Non-medical: Not on file  Tobacco Use  . Smoking status: Former Smoker    Types: Cigarettes    Last attempt to quit: 11/05/1992    Years since quitting: 25.9  . Smokeless tobacco: Never Used  Substance and Sexual Activity  . Alcohol use: Yes    Comment: occasional  . Drug  use: No  . Sexual activity: Not on file  Lifestyle  . Physical activity:    Days per week: Not on file    Minutes per session: Not on file  . Stress: Not on file  Relationships  . Social connections:    Talks on phone: Not on file    Gets together: Not on file    Attends religious service: Not on file    Active member of club or organization: Not on file    Attends meetings of clubs or organizations: Not on file    Relationship status: Not on file  . Intimate partner violence:    Fear of current or ex partner: Not on file    Emotionally abused: Not on file    Physically abused: Not on file    Forced sexual activity: Not on file  Other Topics Concern  . Not on file  Social History Narrative  . Not on file    Outpatient Encounter Medications as of 10/27/2018  Medication Sig  . hydrochlorothiazide (HYDRODIURIL) 25 MG tablet Take 1 tablet (25 mg total) by mouth daily. (Needs to be seen before next refill)  . Insulin Pen Needle (B-D UF III MINI PEN NEEDLES) 31G X 5 MM MISC 1 Device daily by Does not apply route.  .   liraglutide (VICTOZA) 18 MG/3ML SOPN INJECT 1.8 MG UNDER THE SKIN ONCE DAILY  . lisinopril (PRINIVIL,ZESTRIL) 5 MG tablet Take 1 tablet (5 mg total) by mouth daily.  . Omega-3 Fatty Acids (FISH OIL) 1000 MG CAPS Take 1,000 mg by mouth daily.   No facility-administered encounter medications on file as of 10/27/2018.     No Known Allergies  Review of Systems  Constitutional: Negative for fatigue.  Respiratory: Negative for cough, chest tightness and shortness of breath.   Cardiovascular: Negative for chest pain, palpitations and leg swelling.  Gastrointestinal: Negative for abdominal pain.  Neurological: Negative for dizziness, weakness and headaches.  Psychiatric/Behavioral: Negative for confusion.  All other systems reviewed and are negative.       Objective:    BP 132/84   Pulse 82   Temp (!) 97.4 F (36.3 C) (Oral)   Ht 5' 10" (1.778 m)   Wt 284 lb  (128.8 kg)   BMI 40.75 kg/m    Wt Readings from Last 3 Encounters:  10/27/18 284 lb (128.8 kg)  10/13/18 287 lb 8 oz (130.4 kg)  01/13/18 283 lb 3.2 oz (128.5 kg)    Physical Exam Vitals signs and nursing note reviewed.  Constitutional:      General: She is not in acute distress.    Appearance: Normal appearance. She is obese.  HENT:     Head: Normocephalic and atraumatic.     Mouth/Throat:     Mouth: Mucous membranes are moist.     Pharynx: Oropharynx is clear.  Neck:     Musculoskeletal: Normal range of motion and neck supple.     Vascular: No carotid bruit.  Cardiovascular:     Rate and Rhythm: Normal rate and regular rhythm.     Heart sounds: Normal heart sounds. No murmur. No friction rub. No gallop.   Pulmonary:     Effort: Pulmonary effort is normal.     Breath sounds: Normal breath sounds.  Skin:    General: Skin is warm and dry.     Capillary Refill: Capillary refill takes less than 2 seconds.  Neurological:     General: No focal deficit present.     Mental Status: She is alert and oriented to person, place, and time.  Psychiatric:        Mood and Affect: Mood normal.        Behavior: Behavior normal.        Thought Content: Thought content normal.        Judgment: Judgment normal.     Results for orders placed or performed in visit on 10/13/18  CMP14+EGFR  Result Value Ref Range   Glucose 199 (H) 65 - 99 mg/dL   BUN 10 6 - 24 mg/dL   Creatinine, Ser 0.78 0.57 - 1.00 mg/dL   GFR calc non Af Amer 88 >59 mL/min/1.73   GFR calc Af Amer 101 >59 mL/min/1.73   BUN/Creatinine Ratio 13 9 - 23   Sodium 140 134 - 144 mmol/L   Potassium 3.8 3.5 - 5.2 mmol/L   Chloride 99 96 - 106 mmol/L   CO2 25 20 - 29 mmol/L   Calcium 9.1 8.7 - 10.2 mg/dL   Total Protein 6.5 6.0 - 8.5 g/dL   Albumin 3.8 3.5 - 5.5 g/dL   Globulin, Total 2.7 1.5 - 4.5 g/dL   Albumin/Globulin Ratio 1.4 1.2 - 2.2   Bilirubin Total 0.4 0.0 - 1.2 mg/dL   Alkaline Phosphatase 74 39 - 117 IU/L      AST 25 0 - 40 IU/L   ALT 46 (H) 0 - 32 IU/L  CBC with Differential/Platelet  Result Value Ref Range   WBC 5.4 3.4 - 10.8 x10E3/uL   RBC 4.90 3.77 - 5.28 x10E6/uL   Hemoglobin 14.0 11.1 - 15.9 g/dL   Hematocrit 43.5 34.0 - 46.6 %   MCV 89 79 - 97 fL   MCH 28.6 26.6 - 33.0 pg   MCHC 32.2 31.5 - 35.7 g/dL   RDW 13.4 12.3 - 15.4 %   Platelets 262 150 - 450 x10E3/uL   Neutrophils 51 Not Estab. %   Lymphs 35 Not Estab. %   Monocytes 8 Not Estab. %   Eos 5 Not Estab. %   Basos 1 Not Estab. %   Neutrophils Absolute 2.7 1.4 - 7.0 x10E3/uL   Lymphocytes Absolute 1.9 0.7 - 3.1 x10E3/uL   Monocytes Absolute 0.5 0.1 - 0.9 x10E3/uL   EOS (ABSOLUTE) 0.3 0.0 - 0.4 x10E3/uL   Basophils Absolute 0.0 0.0 - 0.2 x10E3/uL   Immature Granulocytes 0 Not Estab. %   Immature Grans (Abs) 0.0 0.0 - 0.1 x10E3/uL  Lipid panel  Result Value Ref Range   Cholesterol, Total 189 100 - 199 mg/dL   Triglycerides 129 0 - 149 mg/dL   HDL 31 (L) >39 mg/dL   VLDL Cholesterol Cal 26 5 - 40 mg/dL   LDL Calculated 132 (H) 0 - 99 mg/dL   Chol/HDL Ratio 6.1 (H) 0.0 - 4.4 ratio  TSH  Result Value Ref Range   TSH 3.470 0.450 - 4.500 uIU/mL  Microalbumin / creatinine urine ratio  Result Value Ref Range   Creatinine, Urine 103.1 Not Estab. mg/dL   Microalbumin, Urine 4.5 Not Estab. ug/mL   Microalb/Creat Ratio 4.4 0.0 - 30.0 mg/g creat  Bayer DCA Hb A1c Waived  Result Value Ref Range   HB A1C (BAYER DCA - WAIVED) 7.7 (H) <7.0 %       Pertinent labs & imaging results that were available during my care of the patient were reviewed by me and considered in my medical decision making.  Assessment & Plan:  Mailyn was seen today for hypertension.  Diagnoses and all orders for this visit:  Essential hypertension Continue medications as prescribed. Report any new or worsening symptoms. Diet and exercise encouraged.  -     CMP14+EGFR     Continue all other maintenance medications.  Follow up plan: Return in  about 3 months (around 01/26/2019), or if symptoms worsen or fail to improve.  Educational handout given for hypertension   The above assessment and management plan was discussed with the patient. The patient verbalized understanding of and has agreed to the management plan. Patient is aware to call the clinic if symptoms persist or worsen. Patient is aware when to return to the clinic for a follow-up visit. Patient educated on when it is appropriate to go to the emergency department.   Michelle Rakes, FNP-C [facility] [phone]  

## 2018-10-27 NOTE — Patient Instructions (Signed)

## 2018-10-28 LAB — CMP14+EGFR
ALT: 34 IU/L — ABNORMAL HIGH (ref 0–32)
AST: 24 IU/L (ref 0–40)
Albumin/Globulin Ratio: 1.6 (ref 1.2–2.2)
Albumin: 4 g/dL (ref 3.5–5.5)
Alkaline Phosphatase: 67 IU/L (ref 39–117)
BUN/Creatinine Ratio: 13 (ref 9–23)
BUN: 10 mg/dL (ref 6–24)
Bilirubin Total: 0.2 mg/dL (ref 0.0–1.2)
CO2: 27 mmol/L (ref 20–29)
Calcium: 9.1 mg/dL (ref 8.7–10.2)
Chloride: 98 mmol/L (ref 96–106)
Creatinine, Ser: 0.76 mg/dL (ref 0.57–1.00)
GFR calc Af Amer: 104 mL/min/{1.73_m2} (ref >59)
GFR calc non Af Amer: 90 mL/min/{1.73_m2} (ref >59)
Globulin, Total: 2.5 g/dL (ref 1.5–4.5)
Glucose: 132 mg/dL — ABNORMAL HIGH (ref 65–99)
POTASSIUM: 3.7 mmol/L (ref 3.5–5.2)
SODIUM: 142 mmol/L (ref 134–144)
Total Protein: 6.5 g/dL (ref 6.0–8.5)

## 2018-11-12 ENCOUNTER — Ambulatory Visit (INDEPENDENT_AMBULATORY_CARE_PROVIDER_SITE_OTHER): Payer: Self-pay

## 2018-11-12 DIAGNOSIS — Z1211 Encounter for screening for malignant neoplasm of colon: Secondary | ICD-10-CM

## 2018-11-12 MED ORDER — NA SULFATE-K SULFATE-MG SULF 17.5-3.13-1.6 GM/177ML PO SOLN: 1.0000 | ORAL | 0 refills | Status: DC

## 2018-11-12 NOTE — Patient Instructions (Addendum)
SPLIT-DOSING PLENVU INSTRUCTION SHEET  Please notify us immediately if you are diabetic, take iron supplements, or if you are on coumadin or any blood thinners.  Patient Name:  Brandy Hernandez Date of procedure:  01/19/19 Time to register at [facility] Stay:  8:30am Provider:  Dr. Oneida Alar   01/18/19  1 Day prior to procedure:      CLEAR LIQUIDS ALL DAY--NO SOLID FOODS OR DAIRY PRODUCTS!   See list of liquids that are allowed and items that are NOT allowed below.   MAKE SURE YOU DRINK AT LEAST 64 OUNCES OF CLEAR LIQUIDS PRIOR TO STARTING YOUR PREP AND HYDRATE THROUGHOUT YOUR BOWEL PREP!   Diabetic Medication Instructions:  Only take 1/2 your normal dosage of Victoza  DO NOT take any oral medications within 1 hour of starting each dose of PLENVU  You can premix your dose of Plenvu and store in refrigerator to get cold BUT you must use within 6 hours!   At 5:00 PM Begin the prep as follows:    1. Empty contents of Dose 1 into the mixing container that comes with PLENVU.  2. Add water to the fill line on the mixing container (at least 16 fluid ounces). Do not add other ingredients to the solution.  3. Thoroughly mix with a spoon or shake with lid on securely until completely dissolved (which may take 2 to 3 minutes).  4. Drink over the next 30 minutes. Be sure to drink all of the solution.  5. Refill the mixing container to the fill line (at least 16 fluid ounces) with clear liquids and drink over the next 30 minutes.   6. Consume additional clear liquids during the evening.  CONTINUE CLEAR LIQUIDS OVERNIGHT. You may take heart, blood pressure or breathing medications.   01/19/19  Day of Procedure   Diabetic Medication Instructions:do not take any victoza prior to your colonoscopy  DO NOT take any oral medications within 1 hour of starting each dose of PLENVU.    5 hours before procedure @ 4:30am: Drink second dose of Plenvu. 1. Empty the contents of Dose 2 Pouch A and Dose  2 Pouch B into the mixing container that comes with PLENVU.  2. Add water to the fill line on the mixing container (at least 16 fluid ounces).  Do not add other ingredients to the solution. 3. Thoroughly mixed with a spoon or shake with lid on securely until completely dissolved (which may take 2-3 minutes).  Drink over the next 30 minutes.  Be sure to drink all of the solution. 4. Refill the mixing container to the fill line (at least 16 fluid ounces) with clear liquids and drink over the next 30 minutes. 5. Consume additional water or clear liquids up to 3 hours before the colonoscopy. Do not drink any liquids after 6:30am  You may take TYLENOL products.  Please continue your regular medications unless we have instructed you otherwise.    Please note, on the day of your procedure you MUST be accompanied by an adult who is willing to assume responsibility for you at time of discharge. If you do not have such person with you, your procedure will have to be rescheduled.  Please leave ALL jewelry at home prior to coming to the hospital for your procedure.   *It is your responsibility to check with your insurance company for the benefits of coverage you have for this procedure. Unfortunately, not all insurance companies have benefits to cover all or part of these types of procedures. It is your responsibility to check your benefits, however we will be glad to assist you with any codes your insurance company may need.   Please note that most insurance companies will not cover a screening colonoscopy for people under the age of 58  For example, with some insurance companies you may have benefits for a screening colonoscopy, but if polyps are found the diagnosis will change and then you may have a deductible that will need to be met. Please make sure you check your benefits for screening  colonoscopy as well as a diagnostic colonoscopy.  CLEAR LIQUIDS: (NO RED) Jello Apple Juice  White Grape Juice Water Banana popsicles  Kool-Aid  Coffee(No cream or milk) Tea (No cream or milk) Soft drinks Broth (fat free beef/chicken/vegetable)  Clear liquids allow you to see your fingers on the other side of the glass.  Be sure they are NOT RED in color, cloudy, but CLEAR.  Do Not Eat: Dairy products of any kind Cranberry juice Tomato or V8 Juice  Orange Juice Grapefruit Juice  Red Grape Juice Solid foods like cereal, oatmeal, yogurt, fruits, vegetables, creamed soups, eggs, bread, etc   HELPFUL HINTS TO MAKE DRINKING EASIER: -Make sure prep is extremely COLD. You may drink over ice. -Trying drinking through a straw. -Rinse mouth with water or mouthwash between glasses to remove aftertaste. -Try sipping on a cold beverage/ice popsicles between glasses of prep. -Place a piece of sugar-free hard candy in mouth between glasses. -If you become nauseated, try consuming smaller amounts or stretch out the time between glasses.  Stop for 30 minutes to an hour & slowly start back drinking.  Call our office with any questions or concerns at [phone].  Thank You

## 2018-11-12 NOTE — Progress Notes (Signed)
Gastroenterology Pre-Procedure Review  Request Date:11/12/18 Requesting Physician: Darla Lesches NP [place]. No previous tcs  PATIENT REVIEW QUESTIONS: The patient responded to the following health history questions as indicated:    1. Diabetes Melitis: yes (victoza at night) 2. Joint replacements in the past 12 months: no 3. Major health problems in the past 3 months: no 4. Has an artificial valve or MVP: no 5. Has a defibrillator: no 6. Has been advised in past to take antibiotics in advance of a procedure like teeth cleaning: no 7. Family history of colon cancer: yes (mother age 73, great aunt , great uncle and grandfather. )  64. Alcohol Use: yes (wine every once in awhile) 9. History of sleep apnea: no  10. History of coronary artery or other vascular stents placed within the last 12 months: no 11. History of any prior anesthesia complications: no    MEDICATIONS & ALLERGIES:    Patient reports the following regarding taking any blood thinners:   Plavix? no Aspirin? no Coumadin? no Brilinta? no Xarelto? no Eliquis? no Pradaxa? no Savaysa? no Effient? no  Patient confirms/reports the following medications:  Current Outpatient Medications  Medication Sig Dispense Refill  . hydrochlorothiazide (HYDRODIURIL) 25 MG tablet Take 1 tablet (25 mg total) by mouth daily. (Needs to be seen before next refill) 30 tablet 3  . liraglutide (VICTOZA) 18 MG/3ML SOPN INJECT 1.8 MG UNDER THE SKIN ONCE DAILY 9 pen 3  . lisinopril (PRINIVIL,ZESTRIL) 5 MG tablet Take 1 tablet (5 mg total) by mouth daily. 90 tablet 3  . Omega-3 Fatty Acids (FISH OIL) 1000 MG CAPS Take 1,000 mg by mouth daily.    . Insulin Pen Needle (B-D UF III MINI PEN NEEDLES) 31G X 5 MM MISC 1 Device daily by Does not apply route. 30 each 11   No current facility-administered medications for this visit.     Patient confirms/reports the following allergies:  No Known Allergies  No orders of the  defined types were placed in this encounter.   AUTHORIZATION INFORMATION Primary Insurance: [employer],  ID #: [id number] Pre-Cert / Josem Kaufmann required: no   SCHEDULE INFORMATION: Procedure has been scheduled as follows:  Date: 01/19/19, Time: 9:30 Location: [facility]  This Gastroenterology Pre-Precedure Review Form is being routed to the following provider(s): Neil Crouch, PA

## 2018-11-15 NOTE — Progress Notes (Signed)
Ok to schedule. Day of prep: 1/2 dose Victoza Am of prep: hold victoza

## 2018-11-19 NOTE — Progress Notes (Signed)
Letter mailed to the pt with DM instructions 

## 2018-11-26 NOTE — Progress Notes (Signed)
Talked to the pt, the prep is too expensive. Offered plenvu sample and the pt accepted. I have changed her instructions and left them with sample.

## 2018-12-10 NOTE — Progress Notes (Signed)
OK for Plenvu.

## 2019-01-14 ENCOUNTER — Other Ambulatory Visit: Payer: Self-pay | Admitting: Family Medicine

## 2019-01-14 DIAGNOSIS — I10 Essential (primary) hypertension: Secondary | ICD-10-CM

## 2019-01-19 ENCOUNTER — Encounter (HOSPITAL_COMMUNITY): Payer: Self-pay | Admitting: *Deleted

## 2019-01-19 ENCOUNTER — Ambulatory Visit (HOSPITAL_COMMUNITY)
Admission: RE | Admit: 2019-01-19 | Discharge: 2019-01-19 | Disposition: A | Discharge status: Home / Self Care | Payer: BC Managed Care – PPO | Attending: Gastroenterology | Admitting: Gastroenterology

## 2019-01-19 ENCOUNTER — Other Ambulatory Visit: Payer: Self-pay

## 2019-01-19 ENCOUNTER — Encounter (HOSPITAL_COMMUNITY)
Admission: RE | Disposition: A | Discharge status: Home / Self Care | Payer: Self-pay | Source: Home / Self Care | Attending: Gastroenterology

## 2019-01-19 DIAGNOSIS — D123 Benign neoplasm of transverse colon: Secondary | ICD-10-CM | POA: Diagnosis not present

## 2019-01-19 DIAGNOSIS — Z1211 Encounter for screening for malignant neoplasm of colon: Secondary | ICD-10-CM

## 2019-01-19 DIAGNOSIS — K648 Other hemorrhoids: Secondary | ICD-10-CM | POA: Diagnosis not present

## 2019-01-19 DIAGNOSIS — K644 Residual hemorrhoidal skin tags: Secondary | ICD-10-CM | POA: Insufficient documentation

## 2019-01-19 DIAGNOSIS — K5730 Diverticulosis of large intestine without perforation or abscess without bleeding: Secondary | ICD-10-CM | POA: Insufficient documentation

## 2019-01-19 DIAGNOSIS — Z87891 Personal history of nicotine dependence: Secondary | ICD-10-CM | POA: Diagnosis not present

## 2019-01-19 DIAGNOSIS — Z791 Long term (current) use of non-steroidal anti-inflammatories (NSAID): Secondary | ICD-10-CM | POA: Insufficient documentation

## 2019-01-19 DIAGNOSIS — I10 Essential (primary) hypertension: Secondary | ICD-10-CM | POA: Insufficient documentation

## 2019-01-19 DIAGNOSIS — Z85828 Personal history of other malignant neoplasm of skin: Secondary | ICD-10-CM | POA: Insufficient documentation

## 2019-01-19 DIAGNOSIS — E119 Type 2 diabetes mellitus without complications: Secondary | ICD-10-CM | POA: Diagnosis not present

## 2019-01-19 DIAGNOSIS — Z8249 Family history of ischemic heart disease and other diseases of the circulatory system: Secondary | ICD-10-CM | POA: Insufficient documentation

## 2019-01-19 DIAGNOSIS — Z794 Long term (current) use of insulin: Secondary | ICD-10-CM | POA: Diagnosis not present

## 2019-01-19 DIAGNOSIS — Q438 Other specified congenital malformations of intestine: Secondary | ICD-10-CM | POA: Diagnosis not present

## 2019-01-19 DIAGNOSIS — Z79899 Other long term (current) drug therapy: Secondary | ICD-10-CM | POA: Diagnosis not present

## 2019-01-19 HISTORY — PX: POLYPECTOMY: SHX5525

## 2019-01-19 HISTORY — PX: COLONOSCOPY: SHX5424

## 2019-01-19 LAB — GLUCOSE, CAPILLARY: Glucose-Capillary: 166 mg/dL — ABNORMAL HIGH (ref 70–99)

## 2019-01-19 SURGERY — COLONOSCOPY
Anesthesia: Moderate Sedation

## 2019-01-19 MED ORDER — MEPERIDINE HCL 100 MG/ML IJ SOLN
INTRAMUSCULAR | Status: AC
  Filled 2019-01-19: qty 2

## 2019-01-19 MED ORDER — MIDAZOLAM HCL 5 MG/5ML IJ SOLN
INTRAMUSCULAR | Status: AC
  Filled 2019-01-19: qty 10

## 2019-01-19 MED ORDER — STERILE WATER FOR IRRIGATION IR SOLN
Status: DC | PRN
  Administered 2019-01-19: 1.5 mL

## 2019-01-19 MED ORDER — MEPERIDINE HCL 100 MG/ML IJ SOLN
INTRAMUSCULAR | Status: DC | PRN
  Administered 2019-01-19 (×3): 25 mg via INTRAVENOUS

## 2019-01-19 MED ORDER — SODIUM CHLORIDE 0.9 % IV SOLN
INTRAVENOUS | Status: DC
  Administered 2019-01-19: 09:00:00 via INTRAVENOUS

## 2019-01-19 MED ORDER — MIDAZOLAM HCL 5 MG/5ML IJ SOLN
INTRAMUSCULAR | Status: DC | PRN
  Administered 2019-01-19 (×3): 2 mg via INTRAVENOUS

## 2019-01-19 NOTE — Discharge Instructions (Signed)
You have internal AND EXTERNAL hemorrhoids and diverticulosis IN YOUR LEFT COLON. YOU HAD ONE SMALL POLYP REMOVED.    DRINK WATER TO KEEP YOUR URINE LIGHT YELLOW.   CONTINUE YOUR WEIGHT LOSS EFFORTS. YOUR BODY MASS INDEX IS OVER 40 WHICH MEANS YOU ARE MORBIDLY OBESE. OBESITY IS ASSOCIATED WITH AN INCREASED FOR CIRRHOSIS AND ALL CANCERS, INCLUDING ESOPHAGEAL AND COLON CANCER. A WEIGHT OF 275 LBS OR LESS WILL GET YOUR BODY MASS INDEX(BMI) UNDER 40 BUT YOUR BODY MASS INDEX WILL STILL BE OVER 30 WHICH MEANS YOU ARE OBESE.  A WEIGHT OF 205 LBS OR LESS  WILL GET YOUR BODY MASS INDEX(BMI) UNDER 30. IF YOU TRY AND CANNOT LOSE WEIGHT OVER THE NEXT 6 MOS TO A YEAR, PLEASE CALL FOR A REFERRAL TO THE BARIATRIC WEIGHT LOSS CLINIC.  FOLLOW A HIGH FIBER DIET. AVOID ITEMS THAT CAUSE BLOATING. See info below.   USE PREPARATION H FOUR TIMES  A DAY IF NEEDED TO RELIEVE RECTAL PAIN/PRESSURE/BLEEDING.   YOUR BIOPSY RESULTS WILL BE BACK IN 5 BUSINESS DAYS.  Next colonoscopy in 5-10 years.  Colonoscopy Care After Read the instructions outlined below and refer to this sheet in the next week. These discharge instructions provide you with general information on caring for yourself after you leave the hospital. While your treatment has been planned according to the most current medical practices available, unavoidable complications occasionally occur. If you have any problems or questions after discharge, call DR. Tysean Vandervliet, [phone].  ACTIVITY  You may resume your regular activity, but move at a slower pace for the next 24 hours.   Take frequent rest periods for the next 24 hours.   Walking will help get rid of the air and reduce the bloated feeling in your belly (abdomen).   No driving for 24 hours (because of the medicine (anesthesia) used during the test).   You may shower.   Do not sign any important legal documents or operate any machinery for 24 hours (because of the anesthesia used during the test).      NUTRITION  Drink plenty of fluids.   You may resume your normal diet as instructed by your doctor.   Begin with a light meal and progress to your normal diet. Heavy or fried foods are harder to digest and may make you feel sick to your stomach (nauseated).   Avoid alcoholic beverages for 24 hours or as instructed.    MEDICATIONS  You may resume your normal medications.   WHAT YOU CAN EXPECT TODAY  Some feelings of bloating in the abdomen.   Passage of more gas than usual.   Spotting of blood in your stool or on the toilet paper  .  IF YOU HAD POLYPS REMOVED DURING THE COLONOSCOPY:  Eat a soft diet IF YOU HAVE NAUSEA, BLOATING, ABDOMINAL PAIN, OR VOMITING.    FINDING OUT THE RESULTS OF YOUR TEST Not all test results are available during your visit. DR. Oneida Alar WILL CALL YOU WITHIN 14 DAYS OF YOUR PROCEDUE WITH YOUR RESULTS. Do not assume everything is normal if you have not heard from DR. Rikia Sukhu, CALL HER OFFICE AT [phone].  SEEK IMMEDIATE MEDICAL ATTENTION AND CALL THE OFFICE: [phone] IF:  You have more than a spotting of blood in your stool.   Your belly is swollen (abdominal distention).   You are nauseated or vomiting.   You have a temperature over 101F.   You have abdominal pain or discomfort that is severe or gets worse throughout the day.  High-Fiber Diet A high-fiber diet changes your normal diet to include more whole grains, legumes, fruits, and vegetables. Changes in the diet involve replacing refined carbohydrates with unrefined foods. The calorie level of the diet is essentially unchanged. The Dietary Reference Intake (recommended amount) for adult males is 38 grams per day. For adult females, it is 25 grams per day. Pregnant and lactating women should consume 28 grams of fiber per day. Fiber is the intact part of a plant that is not broken down during digestion. Functional fiber is fiber that has been isolated from the plant to provide a  beneficial effect in the body.  PURPOSE  Increase stool bulk.   Ease and regulate bowel movements.   Lower cholesterol.   REDUCE RISK OF COLON CANCER  INDICATIONS THAT YOU NEED MORE FIBER  Constipation and hemorrhoids.   Uncomplicated diverticulosis (intestine condition) and irritable bowel syndrome.   Weight management.   As a protective measure against hardening of the arteries (atherosclerosis), diabetes, and cancer.   GUIDELINES FOR INCREASING FIBER IN THE DIET  Start adding fiber to the diet slowly. A gradual increase of about 5 more grams (2 slices of whole-wheat bread, 2 servings of most fruits or vegetables, or 1 bowl of high-fiber cereal) per day is best. Too rapid an increase in fiber may result in constipation, flatulence, and bloating.   Drink enough water and fluids to keep your urine clear or pale yellow. Water, juice, or caffeine-free drinks are recommended. Not drinking enough fluid may cause constipation.   Eat a variety of high-fiber foods rather than one type of fiber.   Try to increase your intake of fiber through using high-fiber foods rather than fiber pills or supplements that contain small amounts of fiber.   The goal is to change the types of food eaten. Do not supplement your present diet with high-fiber foods, but replace foods in your present diet.   INCLUDE A VARIETY OF FIBER SOURCES  Replace refined and processed grains with whole grains, canned fruits with fresh fruits, and incorporate other fiber sources. White rice, white breads, and most bakery goods contain little or no fiber.   Brown whole-grain rice, buckwheat oats, and many fruits and vegetables are all good sources of fiber. These include: broccoli, Brussels sprouts, cabbage, cauliflower, beets, sweet potatoes, white potatoes (skin on), carrots, tomatoes, eggplant, squash, berries, fresh fruits, and dried fruits.   Cereals appear to be the richest source of fiber. Cereal fiber is found in  whole grains and bran. Bran is the fiber-rich outer coat of cereal grain, which is largely removed in refining. In whole-grain cereals, the bran remains. In breakfast cereals, the largest amount of fiber is found in those with "bran" in their names. The fiber content is sometimes indicated on the label.   You may need to include additional fruits and vegetables each day.   In baking, for 1 cup white flour, you may use the following substitutions:   1 cup whole-wheat flour minus 2 tablespoons.   1/2 cup white flour plus 1/2 cup whole-wheat flour.   Polyps, Colon  A polyp is extra tissue that grows inside your body. Colon polyps grow in the large intestine. The large intestine, also called the colon, is part of your digestive system. It is a long, hollow tube at the end of your digestive tract where your body makes and stores stool. Most polyps are not dangerous. They are benign. This means they are not cancerous. But over time, some types  of polyps can turn into cancer. Polyps that are smaller than a pea are usually not harmful. But larger polyps could someday become or may already be cancerous. To be safe, doctors remove all polyps and test them.   PREVENTION There is not one sure way to prevent polyps. You might be able to lower your risk of getting them if you:  Eat more fruits and vegetables and less fatty food.   Do not smoke.   Avoid alcohol.   Exercise every day.   Lose weight if you are overweight.   Eating more calcium and folate can also lower your risk of getting polyps. Some foods that are rich in calcium are milk, cheese, and broccoli. Some foods that are rich in folate are chickpeas, kidney beans, and spinach.    Diverticulosis Diverticulosis is a common condition that develops when small pouches (diverticula) form in the wall of the colon. The risk of diverticulosis increases with age. It happens more often in people who eat a low-fiber diet. Most individuals with  diverticulosis have no symptoms. Those individuals with symptoms usually experience belly (abdominal) pain, constipation, or loose stools (diarrhea).  HOME CARE INSTRUCTIONS  Increase the amount of fiber in your diet as directed by your caregiver or dietician. This may reduce symptoms of diverticulosis.   Drink at least 6 to 8 glasses of water each day to prevent constipation.   Try not to strain when you have a bowel movement.   Avoiding nuts and seeds to prevent complications is NOT NECESSARY.   FOODS HAVING HIGH FIBER CONTENT INCLUDE:  Fruits. Apple, peach, pear, tangerine, raisins, prunes.   Vegetables. Brussels sprouts, asparagus, broccoli, cabbage, carrot, cauliflower, romaine lettuce, spinach, summer squash, tomato, winter squash, zucchini.   Starchy Vegetables. Baked beans, kidney beans, lima beans, split peas, lentils, potatoes (with skin).   Grains. Whole wheat bread, brown rice, bran flake cereal, plain oatmeal, white rice, shredded wheat, bran muffins.   SEEK IMMEDIATE MEDICAL CARE IF:  You develop increasing pain or severe bloating.   You have an oral temperature above 101F.   You develop vomiting or bowel movements that are bloody or black.

## 2019-01-19 NOTE — Op Note (Signed)
[facility] Patient Name: Brandy Hernandez Procedure Date: 01/19/2019 9:36 AM MRN: [id number] Date of Birth: [birth year] Attending MD: Barney Drain MD, MD CSN: [id number] Age: 53 Admit Type: Outpatient Procedure:                Colonoscopy WITH COLD SNARE POLYPECTOMY Indications:              Screening for colorectal malignant neoplasm Providers:                Barney Drain MD, MD, Charlsie Quest. Joanne Gavel, RN,                            Randa Spike, Technician Referring MD:             Connye Burkitt. Rakes Medicines:                Meperidine 75 mg IV, Midazolam 6 mg IV Complications:            No immediate complications. Estimated Blood Loss:     Estimated blood loss was minimal. Procedure:                Pre-Anesthesia Assessment:                           - Prior to the procedure, a History and Physical                            was performed, and patient medications and                            allergies were reviewed. The patient's tolerance of                            previous anesthesia was also reviewed. The risks                            and benefits of the procedure and the sedation                            options and risks were discussed with the patient.                            All questions were answered, and informed consent                            was obtained. Prior Anticoagulants: The patient has                            taken no previous anticoagulant or antiplatelet                            agents. ASA Grade Assessment: II - A patient with                            mild systemic disease. After reviewing the risks  and benefits, the patient was deemed in                            satisfactory condition to undergo the procedure.                            After obtaining informed consent, the colonoscope                            was passed under direct vision. Throughout the                            procedure, the  patient's blood pressure, pulse, and                            oxygen saturations were monitored continuously. The                            [id number]-[id number] ([id number]) scope was introduced through                            the anus and advanced to the the cecum, identified                            by appendiceal orifice and ileocecal valve. The                            colonoscopy was somewhat difficult due to a                            tortuous colon. Successful completion of the                            procedure was aided by straightening and shortening                            the scope to obtain bowel loop reduction and                            COLOWRAP. The patient tolerated the procedure well.                            The quality of the bowel preparation was excellent.                            The ileocecal valve, appendiceal orifice, and                            rectum were photographed. Scope In: 10:09:47 AM Scope Out: 10:23:47 AM Scope Withdrawal Time: 0 hours 11 minutes 16 seconds  Total Procedure Duration: 0 hours 14 minutes 0 seconds  Findings:      A 3 mm polyp was found in the distal transverse colon. The polyp was  sessile. The polyp was removed with a cold snare. Resection and       retrieval were complete.      Multiple small and large-mouthed diverticula were found in the       recto-sigmoid colon, sigmoid colon, descending colon and distal       transverse colon.      External and internal hemorrhoids were found.      The recto-sigmoid colon and sigmoid colon were mildly tortuous. Impression:               - One 3 mm polyp in the distal transverse colon,                            removed with a cold snare. Resected and retrieved.                           - Diverticulosis in the recto-sigmoid colon, in the                            sigmoid colon and in the descending colon.                           - External and internal hemorrhoids.                            - Tortuous LEFT colon. Moderate Sedation:      Moderate (conscious) sedation was administered by the endoscopy nurse       and supervised by the endoscopist. The following parameters were       monitored: oxygen saturation, heart rate, blood pressure, and response       to care. Total physician intraservice time was 27 minutes. Recommendation:           - Patient has a contact number available for                            emergencies. The signs and symptoms of potential                            delayed complications were discussed with the                            patient. Return to normal activities tomorrow.                            Written discharge instructions were provided to the                            patient.                           - High fiber diet. LOSE WEIGHT TO BMI < 30.                           - Continue present medications.                           -  Await pathology results.                           - Repeat colonoscopy in 5-10 years for surveillance. Procedure Code(s):        --- Professional ---                           [phone], Colonoscopy, flexible; with removal of                            tumor(s), polyp(s), or other lesion(s) by snare                            technique                           99153, Moderate sedation; each additional 15                            minutes intraservice time                           G0500, Moderate sedation services provided by the                            same physician or other qualified health care                            professional performing a gastrointestinal                            endoscopic service that sedation supports,                            requiring the presence of an independent trained                            observer to assist in the monitoring of the                            patient's level of consciousness and physiological                            status;  initial 15 minutes of intra-service time;                            patient age 82 years or older (additional time may                            be reported with [phone], as appropriate) Diagnosis Code(s):        --- Professional ---                           Z12.11, Encounter for screening for malignant  neoplasm of colon                           D12.3, Benign neoplasm of transverse colon (hepatic                            flexure or splenic flexure)                           K64.8, Other hemorrhoids                           K57.30, Diverticulosis of large intestine without                            perforation or abscess without bleeding                           Q43.8, Other specified congenital malformations of                            intestine CPT copyright 2018 [employer]. All rights reserved. The codes documented in this report are preliminary and upon coder review may  be revised to meet current compliance requirements. Barney Drain, MD Barney Drain MD, MD 01/19/2019 10:50:02 AM This report has been signed electronically. Number of Addenda: 0

## 2019-01-19 NOTE — H&P (Signed)
Primary Care Physician:  Baruch Gouty, FNP Primary Gastroenterologist:  Dr. Oneida Alar  Pre-Procedure History & Physical: HPI:  Brandy Hernandez is a 53 y.o. female here for COLON CANCER SCREENING.  Past Medical History:  Diagnosis Date  . Basal cell carcinoma   . Diabetes mellitus without complication ([place])   . Hypertension     Past Surgical History:  Procedure Laterality Date  . ABDOMINAL HYSTERECTOMY    . CESAREAN SECTION    . ENDOMETRIAL ABLATION    . SKIN CANCER EXCISION  2018    Prior to Admission medications   Medication Sig Start Date End Date Taking? Authorizing Provider  acetaminophen (TYLENOL) 500 MG tablet Take 1,000 mg by mouth every 6 (six) hours as needed for moderate pain or headache.   Yes [provider]  hydrochlorothiazide (HYDRODIURIL) 25 MG tablet Take 1 tablet (25 mg total) by mouth daily. 01/15/19  Yes Rakes, Connye Burkitt, FNP  ibuprofen (ADVIL,MOTRIN) 200 MG tablet Take 400 mg by mouth every 6 (six) hours as needed for headache or moderate pain.   Yes [provider]  liraglutide (VICTOZA) 18 MG/3ML SOPN INJECT 1.8 MG UNDER THE SKIN ONCE DAILY Patient taking differently: Inject 1.8 mg into the skin at bedtime.  10/13/18  Yes Rakes, Connye Burkitt, FNP  lisinopril (PRINIVIL,ZESTRIL) 5 MG tablet Take 1 tablet (5 mg total) by mouth daily. Patient taking differently: Take 5 mg by mouth at bedtime.  10/13/18  Yes Rakes, Connye Burkitt, FNP  Insulin Pen Needle (B-D UF III MINI PEN NEEDLES) 31G X 5 MM MISC 1 Device daily by Does not apply route. 09/20/17   Timmothy Euler, MD  Na Sulfate-K Sulfate-Mg Sulf (SUPREP BOWEL PREP KIT) 17.5-3.13-1.6 GM/177ML SOLN Take 1 kit by mouth as directed. 11/12/18   Mahala Menghini, PA-C    Allergies as of 11/12/2018  . (No Known Allergies)    Family History  Problem Relation Age of Onset  . Cancer Mother        colorectal  . Heart disease Father   . Heart attack Father   . Diabetes Sister   . Asthma Sister   . Diabetes  Maternal Grandmother   . Heart attack Paternal Grandfather   . Hypertension Sister   . Hypertension Sister     Social History   Socioeconomic History  . Marital status: Married    Spouse name: Not on file  . Number of children: Not on file  . Years of education: Not on file  . Highest education level: Not on file  Occupational History  . Not on file  Social Needs  . Financial resource strain: Not on file  . Food insecurity:    Worry: Not on file    Inability: Not on file  . Transportation needs:    Medical: Not on file    Non-medical: Not on file  Tobacco Use  . Smoking status: Former Smoker    Types: Cigarettes    Last attempt to quit: 11/05/1992    Years since quitting: 26.2  . Smokeless tobacco: Never Used  Substance and Sexual Activity  . Alcohol use: Yes    Comment: occasional  . Drug use: No  . Sexual activity: Not on file  Lifestyle  . Physical activity:    Days per week: Not on file    Minutes per session: Not on file  . Stress: Not on file  Relationships  . Social connections:    Talks on phone: Not on file  Gets together: Not on file    Attends religious service: Not on file    Active member of club or organization: Not on file    Attends meetings of clubs or organizations: Not on file    Relationship status: Not on file  . Intimate partner violence:    Fear of current or ex partner: Not on file    Emotionally abused: Not on file    Physically abused: Not on file    Forced sexual activity: Not on file  Other Topics Concern  . Not on file  Social History Narrative  . Not on file    Review of Systems: See HPI, otherwise negative ROS   Physical Exam: BP 120/74   Pulse 88   Temp 97.6 F (36.4 C) (Oral)   Resp 13   Ht _0  (1.778 m)   Wt 129.3 kg   SpO2 99%   BMI 40.89 kg/m  General:   Alert,  pleasant and cooperative in NAD Head:  Normocephalic and atraumatic. Neck:  Supple; Lungs:  Clear throughout to auscultation.    Heart:   Regular rate and rhythm. Abdomen:  Soft, nontender and nondistended. Normal bowel sounds, without guarding, and without rebound.   Neurologic:  Alert and  oriented x4;  grossly normal neurologically.  Impression/Plan:    SCREENING  Plan:  1. TCS TODAY DISCUSSED PROCEDURE, BENEFITS, & RISKS: < 1% chance of medication reaction, bleeding, perforation, ASPIRATION, or rupture of spleen/liver requiring surgery to fix it and missed polyps < 1 cm 10-20% of the time.

## 2019-01-21 ENCOUNTER — Encounter (HOSPITAL_COMMUNITY): Payer: Self-pay | Admitting: Gastroenterology

## 2019-01-26 NOTE — Progress Notes (Signed)
ON RECALL AND CC'D TO PCP °

## 2019-01-26 NOTE — Progress Notes (Signed)
PT is aware.

## 2019-01-29 ENCOUNTER — Ambulatory Visit: Payer: Self-pay | Admitting: Family Medicine

## 2019-02-03 ENCOUNTER — Ambulatory Visit: Payer: Self-pay | Admitting: Family Medicine

## 2019-02-23 ENCOUNTER — Ambulatory Visit (INDEPENDENT_AMBULATORY_CARE_PROVIDER_SITE_OTHER): Payer: BC Managed Care – PPO | Admitting: Family Medicine

## 2019-02-23 ENCOUNTER — Other Ambulatory Visit: Payer: Self-pay

## 2019-02-23 ENCOUNTER — Encounter: Payer: Self-pay | Admitting: Family Medicine

## 2019-02-23 DIAGNOSIS — E119 Type 2 diabetes mellitus without complications: Secondary | ICD-10-CM | POA: Diagnosis not present

## 2019-02-23 DIAGNOSIS — I10 Essential (primary) hypertension: Secondary | ICD-10-CM

## 2019-02-23 DIAGNOSIS — E1159 Type 2 diabetes mellitus with other circulatory complications: Secondary | ICD-10-CM | POA: Diagnosis not present

## 2019-02-23 MED ORDER — LIRAGLUTIDE 18 MG/3ML ~~LOC~~ SOPN: 1.8000 mg | PEN_INJECTOR | Freq: Every day | SUBCUTANEOUS | 3 refills | Status: DC

## 2019-02-23 MED ORDER — LISINOPRIL 5 MG PO TABS: 5.0000 mg | ORAL_TABLET | Freq: Every day | ORAL | 1 refills | Status: DC

## 2019-02-23 NOTE — Progress Notes (Signed)
Virtual Visit via telephone Note Due to COVID-19, visit is conducted virtually and was requested by patient. This visit type was conducted due to national recommendations for restrictions regarding the COVID-19 Pandemic (e.g. social distancing) in an effort to limit this patient's exposure and mitigate transmission in our community.  Due to her co-morbid illnesses, this patient is at least at moderate risk for complications without adequate follow up.  This format is felt to be most appropriate for this patient at this time.  All issues noted in this document were discussed and addressed.  A physical exam was not performed with this format.   I connected with Brandy Hernandez on 02/23/19 at 0800 by telephone and verified that I am speaking with the correct person using two identifiers. Brandy Hernandez is currently located at home and family is currently with them during visit. The provider, Monia Pouch, FNP is located in their office at time of visit.  I discussed the limitations, risks, security and privacy concerns of performing an evaluation and management service by telephone and the availability of in person appointments. I also discussed with the patient that there may be a patient responsible charge related to this service. The patient expressed understanding and agreed to proceed.  Subjective:  Patient ID: Brandy Hernandez, female    DOB: Mar 23, 1966, 53 y.o.   MRN: [id number]  Chief Complaint:  Medical Management of Chronic Issues   HPI: Brandy Hernandez is a 53 y.o. female presenting on 02/23/2019 for Medical Management of Chronic Issues   1. Hypertension associated with type 2 diabetes mellitus ([place]) Complaint with meds - Yes Checking BP at home ranging 110/80 Exercising Regularly - Yes Watching Salt intake - Yes Pertinent ROS:  Headache - No Chest pain - No Dyspnea - No Palpitations - No LE edema - No They report good compliance with medications and can restate their regimen  by memory. No medication side effects.  BP Readings from Last 3 Encounters:  01/19/19 111/63  10/27/18 132/84  10/13/18 (!) 148/92     2. Type 2 diabetes mellitus without complication, without long-term current use of insulin (HCC) Diabetes mellitus 2 Compliant with meds - Yes Checking CBGs? Yes  Fasting avg - 90  Postprandial average - 120 Exercising regularly? - Yes Watching carbohydrate intake? - Yes Neuropathy ? - No Hypoglycemic events - No Pertinent ROS:  Polyuria - No Polydipsia - No Vision problems - No    Relevant past medical, surgical, family, and social history reviewed and updated as indicated.  Allergies and medications reviewed and updated.   Past Medical History:  Diagnosis Date  . Basal cell carcinoma   . Diabetes mellitus without complication ([place])   . Hypertension     Past Surgical History:  Procedure Laterality Date  . ABDOMINAL HYSTERECTOMY    . CESAREAN SECTION    . COLONOSCOPY N/A 01/19/2019   Procedure: COLONOSCOPY;  Surgeon: Danie Binder, MD;  [facility];  Service: Endoscopy;  Laterality: N/A;  9:30  . ENDOMETRIAL ABLATION    . POLYPECTOMY  01/19/2019   Procedure: POLYPECTOMY;  Surgeon: Danie Binder, MD;  [facility];  Service: Endoscopy;;  transverse colon   . SKIN CANCER EXCISION  2018    Social History   Socioeconomic History  . Marital status: Married    Spouse name: Not on file  . Number of children: Not on file  . Years of education: Not on file  . Highest education  level: Not on file  Occupational History  . Not on file  Social Needs  . Financial resource strain: Not on file  . Food insecurity:    Worry: Not on file    Inability: Not on file  . Transportation needs:    Medical: Not on file    Non-medical: Not on file  Tobacco Use  . Smoking status: Former Smoker    Types: Cigarettes    Last attempt to quit: 11/05/1992    Years since quitting: 26.3  . Smokeless tobacco: Never Used   Substance and Sexual Activity  . Alcohol use: Yes    Comment: occasional  . Drug use: No  . Sexual activity: Not on file  Lifestyle  . Physical activity:    Days per week: Not on file    Minutes per session: Not on file  . Stress: Not on file  Relationships  . Social connections:    Talks on phone: Not on file    Gets together: Not on file    Attends religious service: Not on file    Active member of club or organization: Not on file    Attends meetings of clubs or organizations: Not on file    Relationship status: Not on file  . Intimate partner violence:    Fear of current or ex partner: Not on file    Emotionally abused: Not on file    Physically abused: Not on file    Forced sexual activity: Not on file  Other Topics Concern  . Not on file  Social History Narrative   RETIRED FROM TEACHING AND NOW DOING TAXES. MARRIED-KIDS: 1(BORN 2000). HUSBAND HAD A STROKE AND LIVES IN [place].    Outpatient Encounter Medications as of 02/23/2019  Medication Sig  . acetaminophen (TYLENOL) 500 MG tablet Take 1,000 mg by mouth every 6 (six) hours as needed for moderate pain or headache.  . hydrochlorothiazide (HYDRODIURIL) 25 MG tablet Take 1 tablet (25 mg total) by mouth daily.  Marland Kitchen ibuprofen (ADVIL,MOTRIN) 200 MG tablet Take 400 mg by mouth every 6 (six) hours as needed for headache or moderate pain.  . Insulin Pen Needle (B-D UF III MINI PEN NEEDLES) 31G X 5 MM MISC 1 Device daily by Does not apply route.  . liraglutide (VICTOZA) 18 MG/3ML SOPN Inject 0.3 mLs (1.8 mg total) into the skin at bedtime for 30 days.  Marland Kitchen lisinopril (ZESTRIL) 5 MG tablet Take 1 tablet (5 mg total) by mouth at bedtime.  . [DISCONTINUED] liraglutide (VICTOZA) 18 MG/3ML SOPN INJECT 1.8 MG UNDER THE SKIN ONCE DAILY (Patient taking differently: Inject 1.8 mg into the skin at bedtime. )  . [DISCONTINUED] lisinopril (PRINIVIL,ZESTRIL) 5 MG tablet Take 1 tablet (5 mg total) by mouth daily. (Patient taking differently: Take 5 mg  by mouth at bedtime. )   No facility-administered encounter medications on file as of 02/23/2019.     No Known Allergies  Review of Systems       Observations/Objective: No vital signs or physical exam, this was a telephone or virtual health encounter.  Pt alert and oriented, answers all questions appropriately, and able to speak in full sentences.    Assessment and Plan: Brandy Hernandez was seen today for medical management of chronic issues.  Diagnoses and all orders for this visit:  Hypertension associated with type 2 diabetes mellitus ([place]) Diet and exercise encouraged. Continue below. Reevaluate in 3 months. Will complete labs at this time. -     lisinopril (ZESTRIL) 5  MG tablet; Take 1 tablet (5 mg total) by mouth at bedtime.  Type 2 diabetes mellitus without complication, without long-term current use of insulin ([place]) Due for eye exam, was cancelled due to COVID-19, pt will reschedule. BS doing well on current medication. Will continue. Diet and exercise encouraged. Will get blood work in 3 months.  -     liraglutide (VICTOZA) 18 MG/3ML SOPN; Inject 0.3 mLs (1.8 mg total) into the skin at bedtime for 30 days.     Follow Up Instructions: Return in about 3 months (around 05/25/2019), or if symptoms worsen or fail to improve, for HTN, DM.    I discussed the assessment and treatment plan with the patient. The patient was provided an opportunity to ask questions and all were answered. The patient agreed with the plan and demonstrated an understanding of the instructions.   The patient was advised to call back or seek an in-person evaluation if the symptoms worsen or if the condition fails to improve as anticipated.  The above assessment and management plan was discussed with the patient. The patient verbalized understanding of and has agreed to the management plan. Patient is aware to call the clinic if symptoms persist or worsen. Patient is aware when to return to the clinic for a  follow-up visit. Patient educated on when it is appropriate to go to the emergency department.    I provided 15 minutes of non-face-to-face time during this encounter. The call started at 0800. The call ended at 0815.   Monia Pouch, FNP-C [facility] [address] [place], [place] [address] [phone]

## 2019-04-17 LAB — HM DIABETES EYE EXAM

## 2019-04-21 ENCOUNTER — Other Ambulatory Visit: Payer: Self-pay | Admitting: Family Medicine

## 2019-04-21 DIAGNOSIS — I10 Essential (primary) hypertension: Secondary | ICD-10-CM

## 2019-05-26 ENCOUNTER — Encounter: Payer: Self-pay | Admitting: Family Medicine

## 2019-07-21 ENCOUNTER — Other Ambulatory Visit: Payer: Self-pay | Admitting: Family Medicine

## 2019-07-21 DIAGNOSIS — I10 Essential (primary) hypertension: Secondary | ICD-10-CM

## 2019-10-15 ENCOUNTER — Other Ambulatory Visit: Payer: Self-pay | Admitting: Family Medicine

## 2019-10-15 DIAGNOSIS — E1159 Type 2 diabetes mellitus with other circulatory complications: Secondary | ICD-10-CM

## 2019-10-15 DIAGNOSIS — I152 Hypertension secondary to endocrine disorders: Secondary | ICD-10-CM

## 2019-10-20 ENCOUNTER — Other Ambulatory Visit: Payer: Self-pay | Admitting: Family Medicine

## 2019-10-20 DIAGNOSIS — I10 Essential (primary) hypertension: Secondary | ICD-10-CM

## 2019-11-10 ENCOUNTER — Other Ambulatory Visit: Payer: Self-pay | Admitting: Family Medicine

## 2019-11-10 DIAGNOSIS — E1159 Type 2 diabetes mellitus with other circulatory complications: Secondary | ICD-10-CM

## 2019-11-11 ENCOUNTER — Other Ambulatory Visit: Payer: Self-pay | Admitting: Family Medicine

## 2019-11-11 DIAGNOSIS — I10 Essential (primary) hypertension: Secondary | ICD-10-CM

## 2019-11-11 MED ORDER — HYDROCHLOROTHIAZIDE 25 MG PO TABS: 25.0000 mg | ORAL_TABLET | Freq: Every day | ORAL | 0 refills | Status: DC

## 2019-11-11 MED ORDER — LISINOPRIL 5 MG PO TABS: 5.0000 mg | ORAL_TABLET | Freq: Every day | ORAL | 0 refills | Status: DC

## 2019-11-11 NOTE — Telephone Encounter (Signed)
Rakes. NTBS 30 days given 10/15/19

## 2019-11-11 NOTE — Addendum Note (Signed)
Addended by: Zannie Cove on: 11/11/2019 11:38 AM   Modules accepted: Orders

## 2019-11-11 NOTE — Telephone Encounter (Signed)
Rakes. NTBS 30 days given 10/20/19

## 2019-11-11 NOTE — Telephone Encounter (Signed)
Pt called and aware NTBS - appt made for 1/18 - med refilled until that date

## 2019-11-11 NOTE — Addendum Note (Signed)
Addended by: Zannie Cove on: 11/11/2019 11:39 AM   Modules accepted: Orders

## 2019-11-15 ENCOUNTER — Encounter: Payer: Self-pay | Admitting: Family Medicine

## 2019-11-23 ENCOUNTER — Ambulatory Visit: Payer: BC Managed Care – PPO | Admitting: Family Medicine

## 2019-12-01 DIAGNOSIS — U071 COVID-19: Secondary | ICD-10-CM | POA: Insufficient documentation

## 2019-12-09 ENCOUNTER — Other Ambulatory Visit: Payer: Self-pay

## 2019-12-09 ENCOUNTER — Encounter: Payer: Self-pay | Admitting: *Deleted

## 2019-12-09 DIAGNOSIS — E119 Type 2 diabetes mellitus without complications: Secondary | ICD-10-CM | POA: Insufficient documentation

## 2019-12-10 ENCOUNTER — Encounter: Payer: Self-pay | Admitting: Family Medicine

## 2019-12-10 ENCOUNTER — Ambulatory Visit: Payer: BC Managed Care – PPO | Admitting: Family Medicine

## 2019-12-10 VITALS — BP 110/69 | HR 84 | Temp 97.1°F | Resp 20 | Ht 70.0 in | Wt 281.0 lb

## 2019-12-10 DIAGNOSIS — E119 Type 2 diabetes mellitus without complications: Secondary | ICD-10-CM | POA: Diagnosis not present

## 2019-12-10 DIAGNOSIS — Z23 Encounter for immunization: Secondary | ICD-10-CM | POA: Diagnosis not present

## 2019-12-10 DIAGNOSIS — E1159 Type 2 diabetes mellitus with other circulatory complications: Secondary | ICD-10-CM | POA: Diagnosis not present

## 2019-12-10 DIAGNOSIS — I10 Essential (primary) hypertension: Secondary | ICD-10-CM

## 2019-12-10 DIAGNOSIS — Z6841 Body Mass Index (BMI) 40.0 and over, adult: Secondary | ICD-10-CM

## 2019-12-10 DIAGNOSIS — I152 Hypertension secondary to endocrine disorders: Secondary | ICD-10-CM

## 2019-12-10 LAB — BAYER DCA HB A1C WAIVED: HB A1C (BAYER DCA - WAIVED): 7.4 % — ABNORMAL HIGH (ref <7.0)

## 2019-12-10 MED ORDER — HYDROCHLOROTHIAZIDE 25 MG PO TABS: 25.0000 mg | ORAL_TABLET | Freq: Every day | ORAL | 0 refills | Status: DC

## 2019-12-10 MED ORDER — VICTOZA 18 MG/3ML ~~LOC~~ SOPN: 1.8000 mg | PEN_INJECTOR | Freq: Every day | SUBCUTANEOUS | 3 refills | Status: DC

## 2019-12-10 MED ORDER — LISINOPRIL 5 MG PO TABS: 5.0000 mg | ORAL_TABLET | Freq: Every day | ORAL | 0 refills | Status: DC

## 2019-12-10 MED ORDER — BD PEN NEEDLE MINI U/F 31G X 5 MM MISC: 1.0000 | Freq: Every day | 11 refills | Status: DC

## 2019-12-10 NOTE — Progress Notes (Signed)
Subjective:  Patient ID: Brandy Hernandez, female    DOB: 03/12/1966, 54 y.o.   MRN: [id number]  Patient Care Team: Baruch Gouty, FNP as PCP - General (Family Medicine) Gala Romney Cristopher Estimable, MD as Consulting Physician (Gastroenterology)   Chief Complaint:  Medical Management of Chronic Issues (6 mo ), Hypertension, and Diabetes   HPI: Brandy Hernandez is a 54 y.o. female presenting on 12/10/2019 for Medical Management of Chronic Issues (6 mo ), Hypertension, and Diabetes   1. Type 2 diabetes mellitus without complication, without long-term current use of insulin (HCC) Doing well on Victoza. States blood sugars have been great at home. No high or low readings. No polyuria, polyphagia, or polydipsia.   2. Hypertension associated with type 2 diabetes mellitus ([place]) We controlled. Tries to watch salt intake. No headaches, dizziness, visual changes, chest pain, leg swelling, palpitations, or dizziness. Takes medications as prescribed.   3. Morbid obesity with BMI of 40.0-44.9, adult ([place]) Does try to watch diet and has started a healthy eating routine. No regular exercise.      Relevant past medical, surgical, family, and social history reviewed and updated as indicated.  Allergies and medications reviewed and updated. Date reviewed: Chart in Epic.   Past Medical History:  Diagnosis Date  . Basal cell carcinoma   . COVID-19   . Diabetes mellitus without complication ([place])   . Hypertension     Past Surgical History:  Procedure Laterality Date  . ABDOMINAL HYSTERECTOMY    . CESAREAN SECTION    . COLONOSCOPY N/A 01/19/2019   Procedure: COLONOSCOPY;  Surgeon: Danie Binder, MD;  [facility];  Service: Endoscopy;  Laterality: N/A;  9:30  . ENDOMETRIAL ABLATION    . POLYPECTOMY  01/19/2019   Procedure: POLYPECTOMY;  Surgeon: Danie Binder, MD;  [facility];  Service: Endoscopy;;  transverse colon   . SKIN CANCER EXCISION  2018    Social History    Socioeconomic History  . Marital status: Married    Spouse name: Not on file  . Number of children: Not on file  . Years of education: Not on file  . Highest education level: Not on file  Occupational History  . Not on file  Tobacco Use  . Smoking status: Former Smoker    Types: Cigarettes    Quit date: 11/05/1992    Years since quitting: 27.1  . Smokeless tobacco: Never Used  Substance and Sexual Activity  . Alcohol use: Yes    Comment: occasional  . Drug use: No  . Sexual activity: Not on file  Other Topics Concern  . Not on file  Social History Narrative   RETIRED FROM TEACHING AND NOW DOING TAXES. MARRIED-KIDS: 1(BORN 2000). HUSBAND HAD A STROKE AND LIVES IN [place].   Social Determinants of Health   Financial Resource Strain:   . Difficulty of Paying Living Expenses: Not on file  Food Insecurity:   . Worried About Charity fundraiser in the Last Year: Not on file  . Ran Out of Food in the Last Year: Not on file  Transportation Needs:   . Lack of Transportation (Medical): Not on file  . Lack of Transportation (Non-Medical): Not on file  Physical Activity:   . Days of Exercise per Week: Not on file  . Minutes of Exercise per Session: Not on file  Stress:   . Feeling of Stress : Not on file  Social Connections:   .  Frequency of Communication with Friends and Family: Not on file  . Frequency of Social Gatherings with Friends and Family: Not on file  . Attends Religious Services: Not on file  . Active Member of Clubs or Organizations: Not on file  . Attends Archivist Meetings: Not on file  . Marital Status: Not on file  Intimate Partner Violence:   . Fear of Current or Ex-Partner: Not on file  . Emotionally Abused: Not on file  . Physically Abused: Not on file  . Sexually Abused: Not on file    Outpatient Encounter Medications as of 12/10/2019  Medication Sig  . acetaminophen (TYLENOL) 500 MG tablet Take 1,000 mg by mouth every 6 (six) hours as needed for  moderate pain or headache.  . hydrochlorothiazide (HYDRODIURIL) 25 MG tablet Take 1 tablet (25 mg total) by mouth daily. (Needs to be seen before next refill)  . ibuprofen (ADVIL,MOTRIN) 200 MG tablet Take 400 mg by mouth every 6 (six) hours as needed for headache or moderate pain.  . Insulin Pen Needle (B-D UF III MINI PEN NEEDLES) 31G X 5 MM MISC 1 Device by Does not apply route daily.  Marland Kitchen liraglutide (VICTOZA) 18 MG/3ML SOPN Inject 0.3 mLs (1.8 mg total) into the skin at bedtime.  Marland Kitchen lisinopril (ZESTRIL) 5 MG tablet Take 1 tablet (5 mg total) by mouth daily. (Needs to be seen before next refill)  . [DISCONTINUED] hydrochlorothiazide (HYDRODIURIL) 25 MG tablet Take 1 tablet (25 mg total) by mouth daily. (Needs to be seen before next refill)  . [DISCONTINUED] Insulin Pen Needle (B-D UF III MINI PEN NEEDLES) 31G X 5 MM MISC 1 Device daily by Does not apply route.  . [DISCONTINUED] liraglutide (VICTOZA) 18 MG/3ML SOPN Inject 0.3 mLs (1.8 mg total) into the skin at bedtime for 30 days.  . [DISCONTINUED] lisinopril (ZESTRIL) 5 MG tablet Take 1 tablet (5 mg total) by mouth daily. (Needs to be seen before next refill)   No facility-administered encounter medications on file as of 12/10/2019.    No Known Allergies  Review of Systems  Constitutional: Negative for activity change, appetite change, chills, diaphoresis, fatigue, fever and unexpected weight change.  HENT: Negative.   Eyes: Negative.  Negative for photophobia and visual disturbance.  Respiratory: Negative for cough, chest tightness and shortness of breath.   Cardiovascular: Negative for chest pain, palpitations and leg swelling.  Gastrointestinal: Negative for abdominal pain, blood in stool, constipation, diarrhea, nausea and vomiting.  Endocrine: Negative.  Negative for cold intolerance, heat intolerance, polydipsia, polyphagia and polyuria.  Genitourinary: Negative for decreased urine volume, difficulty urinating, dysuria, frequency and  urgency.  Musculoskeletal: Negative for arthralgias and myalgias.  Skin: Negative.   Allergic/Immunologic: Negative.   Neurological: Negative for dizziness, tremors, seizures, syncope, facial asymmetry, speech difficulty, weakness, light-headedness, numbness and headaches.  Hematological: Negative.   Psychiatric/Behavioral: Negative for confusion, hallucinations, sleep disturbance and suicidal ideas.  All other systems reviewed and are negative.       Objective:  BP 110/69   Pulse 84   Temp (!) 97.1 F (36.2 C)   Resp 20   Ht 5' 10"  (1.778 m)   Wt 281 lb (127.5 kg)   SpO2 99%   BMI 40.32 kg/m    Wt Readings from Last 3 Encounters:  12/10/19 281 lb (127.5 kg)  01/19/19 285 lb (129.3 kg)  10/27/18 284 lb (128.8 kg)    Physical Exam Vitals and nursing note reviewed.  Constitutional:  General: She is not in acute distress.    Appearance: Normal appearance. She is well-developed and well-groomed. She is morbidly obese. She is not ill-appearing, toxic-appearing or diaphoretic.  HENT:     Head: Normocephalic and atraumatic.     Jaw: There is normal jaw occlusion.     Right Ear: Hearing normal.     Left Ear: Hearing normal.     Nose: Nose normal.     Mouth/Throat:     Lips: Pink.     Mouth: Mucous membranes are moist.     Pharynx: Oropharynx is clear. Uvula midline.  Eyes:     General: Lids are normal.     Extraocular Movements: Extraocular movements intact.     Conjunctiva/sclera: Conjunctivae normal.     Pupils: Pupils are equal, round, and reactive to light.  Neck:     Thyroid: No thyroid mass, thyromegaly or thyroid tenderness.     Vascular: No carotid bruit or JVD.     Trachea: Trachea and phonation normal.  Cardiovascular:     Rate and Rhythm: Normal rate and regular rhythm.     Chest Wall: PMI is not displaced.     Pulses: Normal pulses.          Dorsalis pedis pulses are 2+ on the right side and 2+ on the left side.       Posterior tibial pulses are 2+  on the right side and 2+ on the left side.     Heart sounds: Normal heart sounds. No murmur. No friction rub. No gallop.   Pulmonary:     Effort: Pulmonary effort is normal. No respiratory distress.     Breath sounds: Normal breath sounds. No wheezing.  Abdominal:     General: Bowel sounds are normal. There is no distension or abdominal bruit.     Palpations: Abdomen is soft. There is no hepatomegaly or splenomegaly.     Tenderness: There is no abdominal tenderness. There is no right CVA tenderness or left CVA tenderness.     Hernia: No hernia is present.  Musculoskeletal:        General: Normal range of motion.     Cervical back: Normal range of motion and neck supple.     Right lower leg: No edema.     Left lower leg: No edema.  Feet:     Right foot:     Protective Sensation: 10 sites tested. 10 sites sensed.     Skin integrity: Skin integrity normal.     Left foot:     Protective Sensation: 10 sites tested. 10 sites sensed.     Skin integrity: Skin integrity normal.  Lymphadenopathy:     Cervical: No cervical adenopathy.  Skin:    General: Skin is warm and dry.     Capillary Refill: Capillary refill takes less than 2 seconds.     Coloration: Skin is not cyanotic, jaundiced or pale.     Findings: No rash.  Neurological:     General: No focal deficit present.     Mental Status: She is alert and oriented to person, place, and time.     Cranial Nerves: Cranial nerves are intact. No cranial nerve deficit.     Sensory: Sensation is intact. No sensory deficit.     Motor: Motor function is intact. No weakness.     Coordination: Coordination is intact. Coordination normal.     Gait: Gait is intact. Gait normal.     Deep Tendon Reflexes: Reflexes are  normal and symmetric. Reflexes normal.  Psychiatric:        Attention and Perception: Attention and perception normal.        Mood and Affect: Mood and affect normal.        Speech: Speech normal.        Behavior: Behavior normal.  Behavior is cooperative.        Thought Content: Thought content normal.        Cognition and Memory: Cognition and memory normal.        Judgment: Judgment normal.     Results for orders placed or performed in visit on 04/24/19  HM DIABETES EYE EXAM  Result Value Ref Range   HM Diabetic Eye Exam No Retinopathy No Retinopathy       Pertinent labs & imaging results that were available during my care of the patient were reviewed by me and considered in my medical decision making.  Assessment & Plan:  Emogene was seen today for medical management of chronic issues, hypertension and diabetes.  Diagnoses and all orders for this visit:  Morbid obesity with BMI of 40.0-44.9, adult ([place]) Diet and exercise encouraged. Labs pending.  -     CBC with Differential/Platelet -     CMP14+EGFR -     Thyroid Panel With TSH -     Lipid panel -     Microalbumin / creatinine urine ratio  Type 2 diabetes mellitus without complication, without long-term current use of insulin (HCC) A1C 7.4, no changes in regimen. Pt aware to adjust diet. Labs pending. Diet and exercise encouraged. Follow up in 3 months.  -     liraglutide (VICTOZA) 18 MG/3ML SOPN; Inject 0.3 mLs (1.8 mg total) into the skin at bedtime. -     Insulin Pen Needle (B-D UF III MINI PEN NEEDLES) 31G X 5 MM MISC; 1 Device by Does not apply route daily. -     Bayer DCA Hb A1c Waived -     CBC with Differential/Platelet -     CMP14+EGFR -     Thyroid Panel With TSH -     Lipid panel -     Microalbumin / creatinine urine ratio  Hypertension associated with type 2 diabetes mellitus (HCC) BP well controlled. Changes were not made in regimen today. Goal BP is 130/80. Pt aware to report any persistent high or low readings. DASH diet and exercise encouraged. Exercise at least 150 minutes per week and increase as tolerated. Goal BMI > 25. Stress management encouraged. Avoid nicotine and tobacco product use. Avoid excessive alcohol and NSAID's.  Avoid more than 2000 mg of sodium daily. Medications as prescribed. Follow up as scheduled.  -     hydrochlorothiazide (HYDRODIURIL) 25 MG tablet; Take 1 tablet (25 mg total) by mouth daily. (Needs to be seen before next refill) -     lisinopril (ZESTRIL) 5 MG tablet; Take 1 tablet (5 mg total) by mouth daily. (Needs to be seen before next refill) -     CBC with Differential/Platelet -     CMP14+EGFR -     Thyroid Panel With TSH -     Lipid panel -     Microalbumin / creatinine urine ratio     Continue all other maintenance medications.  Follow up plan: Return in about 6 months (around 06/08/2020), or if symptoms worsen or fail to improve, for DM.  Continue healthy lifestyle choices, including diet (rich in fruits, vegetables, and lean proteins, and low in  salt and simple carbohydrates) and exercise (at least 30 minutes of moderate physical activity daily).  Educational handout given for DM  The above assessment and management plan was discussed with the patient. The patient verbalized understanding of and has agreed to the management plan. Patient is aware to call the clinic if they develop any new symptoms or if symptoms persist or worsen. Patient is aware when to return to the clinic for a follow-up visit. Patient educated on when it is appropriate to go to the emergency department.   Monia Pouch, FNP-C [facility] [phone]

## 2019-12-10 NOTE — Patient Instructions (Signed)

## 2019-12-11 LAB — CMP14+EGFR
ALT: 39 IU/L — ABNORMAL HIGH (ref 0–32)
AST: 32 IU/L (ref 0–40)
Albumin/Globulin Ratio: 1.5 (ref 1.2–2.2)
Albumin: 4 g/dL (ref 3.8–4.9)
Alkaline Phosphatase: 73 IU/L (ref 39–117)
BUN/Creatinine Ratio: 13 (ref 9–23)
BUN: 9 mg/dL (ref 6–24)
Bilirubin Total: 0.4 mg/dL (ref 0.0–1.2)
CO2: 27 mmol/L (ref 20–29)
Calcium: 9.2 mg/dL (ref 8.7–10.2)
Chloride: 97 mmol/L (ref 96–106)
Creatinine, Ser: 0.68 mg/dL (ref 0.57–1.00)
GFR calc Af Amer: 115 mL/min/{1.73_m2} (ref >59)
GFR calc non Af Amer: 100 mL/min/{1.73_m2} (ref >59)
Globulin, Total: 2.6 g/dL (ref 1.5–4.5)
Glucose: 135 mg/dL — ABNORMAL HIGH (ref 65–99)
Potassium: 3.6 mmol/L (ref 3.5–5.2)
Sodium: 138 mmol/L (ref 134–144)
Total Protein: 6.6 g/dL (ref 6.0–8.5)

## 2019-12-11 LAB — CBC WITH DIFFERENTIAL/PLATELET
Basophils Absolute: 0 10*3/uL (ref 0.0–0.2)
Basos: 1 %
EOS (ABSOLUTE): 0.2 10*3/uL (ref 0.0–0.4)
Eos: 4 %
Hematocrit: 44.4 % (ref 34.0–46.6)
Hemoglobin: 14.8 g/dL (ref 11.1–15.9)
Immature Grans (Abs): 0 10*3/uL (ref 0.0–0.1)
Immature Granulocytes: 1 %
Lymphocytes Absolute: 2.1 10*3/uL (ref 0.7–3.1)
Lymphs: 34 %
MCH: 29.8 pg (ref 26.6–33.0)
MCHC: 33.3 g/dL (ref 31.5–35.7)
MCV: 90 fL (ref 79–97)
Monocytes Absolute: 0.5 10*3/uL (ref 0.1–0.9)
Monocytes: 8 %
Neutrophils Absolute: 3.2 10*3/uL (ref 1.4–7.0)
Neutrophils: 52 %
Platelets: 259 10*3/uL (ref 150–450)
RBC: 4.96 x10E6/uL (ref 3.77–5.28)
RDW: 13.5 % (ref 11.7–15.4)
WBC: 6.1 10*3/uL (ref 3.4–10.8)

## 2019-12-11 LAB — LIPID PANEL
Chol/HDL Ratio: 5.8 ratio — ABNORMAL HIGH (ref 0.0–4.4)
Cholesterol, Total: 187 mg/dL (ref 100–199)
HDL: 32 mg/dL — ABNORMAL LOW (ref >39)
LDL Chol Calc (NIH): 130 mg/dL — ABNORMAL HIGH (ref 0–99)
Triglycerides: 137 mg/dL (ref 0–149)
VLDL Cholesterol Cal: 25 mg/dL (ref 5–40)

## 2019-12-11 LAB — THYROID PANEL WITH TSH
Free Thyroxine Index: 2.3 (ref 1.2–4.9)
T3 Uptake Ratio: 24 % (ref 24–39)
T4, Total: 9.4 ug/dL (ref 4.5–12.0)
TSH: 2.85 u[IU]/mL (ref 0.450–4.500)

## 2019-12-14 ENCOUNTER — Other Ambulatory Visit: Payer: Self-pay | Admitting: Family Medicine

## 2019-12-14 DIAGNOSIS — E785 Hyperlipidemia, unspecified: Secondary | ICD-10-CM

## 2019-12-14 DIAGNOSIS — E1169 Type 2 diabetes mellitus with other specified complication: Secondary | ICD-10-CM

## 2019-12-14 DIAGNOSIS — E1159 Type 2 diabetes mellitus with other circulatory complications: Secondary | ICD-10-CM

## 2019-12-14 MED ORDER — ASPIRIN EC 81 MG PO TBEC: 81.0000 mg | DELAYED_RELEASE_TABLET | Freq: Every day | ORAL | 3 refills | Status: DC

## 2019-12-14 MED ORDER — ATORVASTATIN CALCIUM 20 MG PO TABS: 20.0000 mg | ORAL_TABLET | Freq: Every day | ORAL | 3 refills | Status: DC

## 2019-12-18 ENCOUNTER — Other Ambulatory Visit: Payer: Self-pay | Admitting: Family Medicine

## 2019-12-18 DIAGNOSIS — E1159 Type 2 diabetes mellitus with other circulatory complications: Secondary | ICD-10-CM

## 2020-01-12 ENCOUNTER — Other Ambulatory Visit: Payer: Self-pay | Admitting: Orthopaedic Surgery

## 2020-01-12 ENCOUNTER — Other Ambulatory Visit: Payer: Self-pay

## 2020-01-12 ENCOUNTER — Ambulatory Visit
Admission: RE | Admit: 2020-01-12 | Discharge: 2020-01-12 | Disposition: A | Discharge status: Home / Self Care | Payer: BC Managed Care – PPO | Source: Ambulatory Visit | Attending: Orthopaedic Surgery | Admitting: Orthopaedic Surgery

## 2020-01-12 DIAGNOSIS — G8929 Other chronic pain: Secondary | ICD-10-CM

## 2020-01-12 DIAGNOSIS — M25511 Pain in right shoulder: Secondary | ICD-10-CM

## 2020-01-19 NOTE — Patient Instructions (Addendum)
DUE TO COVID-19 ONLY ONE VISITOR IS ALLOWED TO COME WITH YOU AND STAY IN THE WAITING ROOM ONLY DURING PRE OP AND PROCEDURE DAY OF SURGERY. THE 1 VISITOR MAY VISIT WITH YOU AFTER SURGERY IN YOUR PRIVATE ROOM DURING VISITING HOURS ONLY!                  Brandy Hernandez   Your procedure is scheduled on: 01/25/20   Report to [facility] Main  Entrance   Report to SHORT STAY at: 5:30 AM     Call this number if you have problems the morning of surgery [phone]    Remember:  [place], NO CHEWING GUM [place].     Take these medicines the morning of surgery with A SIP OF WATER:  How to Manage Your Diabetes Before and After Surgery  Why is it important to control my blood sugar before and after surgery? . Improving blood sugar levels before and after surgery helps healing and can limit problems. . A way of improving blood sugar control is eating a healthy diet by: o  Eating less sugar and carbohydrates o  Increasing activity/exercise o  Talking with your doctor about reaching your blood sugar goals . High blood sugars (greater than 180 mg/dL) can raise your risk of infections and slow your recovery, so you will need to focus on controlling your diabetes during the weeks before surgery. . Make sure that the doctor who takes care of your diabetes knows about your planned surgery including the date and location.  How do I manage my blood sugar before surgery? . Check your blood sugar at least 4 times a day, starting 2 days before surgery, to make sure that the level is not too high or low. o Check your blood sugar the morning of your surgery when you wake up and every 2 hours until you get to the Short Stay unit. . If your blood sugar is less than 70 mg/dL, you will need to treat for low blood sugar: o Do not take insulin. o Treat a low blood sugar (less than 70 mg/dL) with  cup of clear juice (cranberry or apple), 4  glucose tablets, OR glucose gel. o Recheck blood sugar in 15 minutes after treatment (to make sure it is greater than 70 mg/dL). If your blood sugar is not greater than 70 mg/dL on recheck, call [phone] for further instructions. . Report your blood sugar to the short stay nurse when you get to Short Stay.  . If you are admitted to the hospital after surgery: o Your blood sugar will be checked by the staff and you will probably be given insulin after surgery (instead of oral diabetes medicines) to make sure you have good blood sugar levels. o The goal for blood sugar control after surgery is 80-180 mg/dL.   WHAT DO I DO ABOUT MY DIABETES MEDICATION?  Marland Kitchen Do not take oral diabetes medicines (pills) the morning of surgery.  . THE NIGHT BEFORE SURGERY, USE VICTOSA AS USUAL   .  Marland Kitchen The day of surgery, do not take other diabetes injectables, including Byetta (exenatide), Bydureon (exenatide ER), Victoza (liraglutide), or Trulicity (dulaglutide).     DO NOT TAKE ANY DIABETIC MEDICATIONS DAY OF YOUR SURGERY  You may not have any metal on your body including hair pins and              piercings  Do not wear jewelry, make-up, lotions, powders or perfumes, deodorant             Do not wear nail polish on your fingernails.  Do not shave  48 hours prior to surgery.                 Do not bring valuables to the hospital. [place].  Contacts, dentures or bridgework may not be worn into surgery.  Leave suitcase in the car. After surgery it may be brought to your room.     Patients discharged the day of surgery will not be allowed to drive home. IF YOU ARE HAVING SURGERY AND GOING HOME THE SAME DAY, YOU MUST HAVE AN ADULT TO DRIVE YOU HOME AND BE WITH YOU FOR 24 HOURS. YOU MAY GO HOME BY TAXI OR UBER OR ORTHERWISE, BUT AN ADULT MUST ACCOMPANY YOU HOME AND STAY WITH YOU FOR 24 HOURS.  Name and phone number of your  driver:  Special Instructions: N/A              Please read over the following fact sheets you were given: _____________________________________________________________________             NO SOLID FOOD AFTER MIDNIGHT THE NIGHT PRIOR TO SURGERY. NOTHING BY MOUTH EXCEPT CLEAR LIQUIDS UNTIL: 4:00 AM . PLEASE FINISH GATORADE DRINK PER SURGEON ORDER  WHICH NEEDS TO BE COMPLETED AT: 4:00 AM.   CLEAR LIQUID DIET   Foods Allowed                                                                     Foods Excluded  Coffee and tea, regular and decaf                             liquids that you cannot  Plain Jell-O any favor except red or purple                                           see through such as: Fruit ices (not with fruit pulp)                                     milk, soups, orange juice  Iced Popsicles                                    All solid food Carbonated beverages, regular and diet                                    Cranberry, grape and apple juices Sports drinks like Gatorade  Lightly seasoned clear broth or consume(fat free) Sugar, honey syrup  Sample Menu Breakfast                                Lunch                                     Supper Cranberry juice                    Beef broth                            Chicken broth Jell-O                                     Grape juice                           Apple juice Coffee or tea                        Jell-O                                      Popsicle                                                Coffee or tea                        Coffee or tea  _____________________________________________________________________ [facility]- Preparing for Total Shoulder Arthroplasty    Before surgery, you can play an important role. Because skin is not sterile, your skin needs to be as free of germs as possible. You can reduce the number of germs on your skin by using the following products. . Benzoyl Peroxide  Gel o Reduces the number of germs present on the skin o Applied twice a day to shoulder area starting two days before surgery    ==================================================================  Please follow these instructions carefully:  BENZOYL PEROXIDE 5% GEL  Please do not use if you have an allergy to benzoyl peroxide.   If your skin becomes reddened/irritated stop using the benzoyl peroxide.  Starting two days before surgery, apply as follows: 1. Apply benzoyl peroxide in the morning and at night. Apply after taking a shower. If you are not taking a shower clean entire shoulder front, back, and side along with the armpit with a clean wet washcloth.  2. Place a quarter-sized dollop on your shoulder and rub in thoroughly, making sure to cover the front, back, and side of your shoulder, along with the armpit.   2 days before ____ AM   ____ PM              1 day before ____ AM   ____ PM                         3. Do this twice a  day for two days.  (Last application is the night before surgery, AFTER using the CHG soap as described below).  4. Do NOT apply benzoyl peroxide gel on the day of surgery.          [facility] - Preparing for Surgery Before surgery, you can play an important role.  Because skin is not sterile, your skin needs to be as free of germs as possible.  You can reduce the number of germs on your skin by washing with CHG (chlorahexidine gluconate) soap before surgery.  CHG is an antiseptic cleaner which kills germs and bonds with the skin to continue killing germs even after washing. Please DO NOT use if you have an allergy to CHG or antibacterial soaps.  If your skin becomes reddened/irritated stop using the CHG and inform your nurse when you arrive at Short Stay. Do not shave (including legs and underarms) for at least 48 hours prior to the first CHG shower.  You may shave your face/neck. Please follow these instructions carefully:  1.  Shower with CHG Soap  the night before surgery and the  morning of Surgery.  2.  If you choose to wash your hair, wash your hair first as usual with your  normal  shampoo.  3.  After you shampoo, rinse your hair and body thoroughly to remove the  shampoo.                           4.  Use CHG as you would any other liquid soap.  You can apply chg directly  to the skin and wash                       Gently with a scrungie or clean washcloth.  5.  Apply the CHG Soap to your body ONLY FROM THE NECK DOWN.   Do not use on face/ open                           Wound or open sores. Avoid contact with eyes, ears mouth and genitals (private parts).                       Wash face,  Genitals (private parts) with your normal soap.             6.  Wash thoroughly, paying special attention to the area where your surgery  will be performed.  7.  Thoroughly rinse your body with warm water from the neck down.  8.  DO NOT shower/wash with your normal soap after using and rinsing off  the CHG Soap.                9.  Pat yourself dry with a clean towel.            10.  Wear clean pajamas.            11.  Place clean sheets on your bed the night of your first shower and do not  sleep with pets. Day of Surgery : Do not apply any lotions/deodorants the morning of surgery.  Please wear clean clothes to the hospital/surgery center.  FAILURE TO FOLLOW THESE INSTRUCTIONS MAY RESULT IN THE CANCELLATION OF YOUR SURGERY PATIENT SIGNATURE_________________________________  NURSE SIGNATURE__________________________________  ________________________________________________________________________   Adam Phenix  An incentive spirometer is a tool that can help keep your  lungs clear and active. This tool measures how well you are filling your lungs with each breath. Taking long deep breaths may help reverse or decrease the chance of developing breathing (pulmonary) problems (especially infection) following:  A long period of time when  you are unable to move or be active. BEFORE THE PROCEDURE   If the spirometer includes an indicator to show your best effort, your nurse or respiratory therapist will set it to a desired goal.  If possible, sit up straight or lean slightly forward. Try not to slouch.  Hold the incentive spirometer in an upright position. INSTRUCTIONS FOR USE  1. Sit on the edge of your bed if possible, or sit up as far as you can in bed or on a chair. 2. Hold the incentive spirometer in an upright position. 3. Breathe out normally. 4. Place the mouthpiece in your mouth and seal your lips tightly around it. 5. Breathe in slowly and as deeply as possible, raising the piston or the ball toward the top of the column. 6. Hold your breath for 3-5 seconds or for as long as possible. Allow the piston or ball to fall to the bottom of the column. 7. Remove the mouthpiece from your mouth and breathe out normally. 8. Rest for a few seconds and repeat Steps 1 through 7 at least 10 times every 1-2 hours when you are awake. Take your time and take a few normal breaths between deep breaths. 9. The spirometer may include an indicator to show your best effort. Use the indicator as a goal to work toward during each repetition. 10. After each set of 10 deep breaths, practice coughing to be sure your lungs are clear. If you have an incision (the cut made at the time of surgery), support your incision when coughing by placing a pillow or rolled up towels firmly against it. Once you are able to get out of bed, walk around indoors and cough well. You may stop using the incentive spirometer when instructed by your caregiver.  RISKS AND COMPLICATIONS  Take your time so you do not get dizzy or light-headed.  If you are in pain, you may need to take or ask for pain medication before doing incentive spirometry. It is harder to take a deep breath if you are having pain. AFTER USE  Rest and breathe slowly and easily.  It can be  helpful to keep track of a log of your progress. Your caregiver can provide you with a simple table to help with this. If you are using the spirometer at home, follow these instructions: [place] IF:   You are having difficultly using the spirometer.  You have trouble using the spirometer as often as instructed.  Your pain medication is not giving enough relief while using the spirometer.  You develop fever of 100.5 F (38.1 C) or higher. SEEK IMMEDIATE MEDICAL CARE IF:   You cough up bloody sputum that had not been present before.  You develop fever of 102 F (38.9 C) or greater.  You develop worsening pain at or near the incision site. MAKE SURE YOU:   Understand these instructions.  Will watch your condition.  Will get help right away if you are not doing well or get worse. Document Released: 03/04/2007 Document Revised: 01/14/2012 Document Reviewed: 05/05/2007 [facility] Patient Information 2014 [place], [place].   ________________________________________________________________________

## 2020-01-20 ENCOUNTER — Encounter (HOSPITAL_COMMUNITY): Payer: Self-pay

## 2020-01-20 ENCOUNTER — Other Ambulatory Visit: Payer: Self-pay

## 2020-01-20 ENCOUNTER — Encounter (HOSPITAL_COMMUNITY)
Admission: RE | Admit: 2020-01-20 | Discharge: 2020-01-20 | Disposition: A | Discharge status: Home / Self Care | Payer: BC Managed Care – PPO | Source: Ambulatory Visit | Attending: Orthopaedic Surgery | Admitting: Orthopaedic Surgery

## 2020-01-20 DIAGNOSIS — Z01812 Encounter for preprocedural laboratory examination: Secondary | ICD-10-CM | POA: Insufficient documentation

## 2020-01-20 DIAGNOSIS — R9431 Abnormal electrocardiogram [ECG] [EKG]: Secondary | ICD-10-CM | POA: Diagnosis not present

## 2020-01-20 DIAGNOSIS — Z01810 Encounter for preprocedural cardiovascular examination: Secondary | ICD-10-CM | POA: Insufficient documentation

## 2020-01-20 LAB — CBC
HCT: 44.9 % (ref 36.0–46.0)
Hemoglobin: 14.5 g/dL (ref 12.0–15.0)
MCH: 29.4 pg (ref 26.0–34.0)
MCHC: 32.3 g/dL (ref 30.0–36.0)
MCV: 90.9 fL (ref 80.0–100.0)
Platelets: 289 10*3/uL (ref 150–400)
RBC: 4.94 MIL/uL (ref 3.87–5.11)
RDW: 13.3 % (ref 11.5–15.5)
WBC: 6.7 10*3/uL (ref 4.0–10.5)
nRBC: 0 % (ref 0.0–0.2)

## 2020-01-20 LAB — BASIC METABOLIC PANEL
Anion gap: 9 (ref 5–15)
BUN: 10 mg/dL (ref 6–20)
CO2: 31 mmol/L (ref 22–32)
Calcium: 9.1 mg/dL (ref 8.9–10.3)
Chloride: 99 mmol/L (ref 98–111)
Creatinine, Ser: 0.75 mg/dL (ref 0.44–1.00)
GFR calc Af Amer: >60 mL/min (ref >60)
GFR calc non Af Amer: >60 mL/min (ref >60)
Glucose, Bld: 102 mg/dL — ABNORMAL HIGH (ref 70–99)
Potassium: 3.3 mmol/L — ABNORMAL LOW (ref 3.5–5.1)
Sodium: 139 mmol/L (ref 135–145)

## 2020-01-20 LAB — SURGICAL PCR SCREEN
MRSA, PCR: NEGATIVE
Staphylococcus aureus: NEGATIVE

## 2020-01-20 NOTE — Progress Notes (Addendum)
PCP - Dr. Thayer Ohm. LOV: 12/10/19 Cardiologist -   Chest x-ray -  EKG -  Stress Test -  ECHO -  Cardiac Cath -   Sleep Study -  CPAP -   Fasting Blood Sugar - 123 Checks Blood Sugar __1___ times a day  Blood Thinner Instructions: Aspirin Instructions: Last Dose: 01/17/20  Anesthesia review: Pt. Test positive for COVID on 11/13/19.:Careeverywhere  Patient denies shortness of breath, fever, cough and chest pain at PAT appointment   Patient verbalized understanding of instructions that were given to them at the PAT appointment. Patient was also instructed that they will need to review over the PAT instructions again at home before surgery.

## 2020-01-22 NOTE — H&P (Signed)
PREOPERATIVE H&P  Chief Complaint: RIGHT SHOULDER PAIN  HPI: Brandy Hernandez is a 54 y.o. female who is scheduled for right TOTAL SHOULDER ARTHROPLASTY vs. REVERSE.   Patient has a past medical history significant for hypertension, diabetes, and hyperlipidemia.   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  She had it self-reduced.  She went to the ER afterwards and was noted to have a bony Bankart.  She was seen at [facility] with Dr. Case and placed in a sling, but he referred to Dr. Griffin Basil due to the complex nature of her injury.  She has had continued subjective instability events even while taking her sling off and on.  She has not been very happy with her shoulder function.  Her symptoms are rated as moderate to severe, and have been worsening.  This is significantly impairing activities of daily living.    Please see clinic note for further details on this patient's care.    She has elected for surgical management.   Past Medical History:  Diagnosis Date  . Basal cell carcinoma   . COVID-19   . Diabetes mellitus without complication ([place])   . Hypertension    Past Surgical History:  Procedure Laterality Date  . ABDOMINAL HYSTERECTOMY    . CESAREAN SECTION    . COLONOSCOPY N/A 01/19/2019   Procedure: COLONOSCOPY;  Surgeon: Danie Binder, MD;  Location: [facility] ENDO SUITE;  Service: Endoscopy;  Laterality: N/A;  9:30  . ENDOMETRIAL ABLATION    . POLYPECTOMY  01/19/2019   Procedure: POLYPECTOMY;  Surgeon: Danie Binder, MD;  Location: [facility] END[facility] SUITE;  Service: Endoscopy;;  transverse colon   . SKIN CANCER EXCISION  2018   Social History   Socioeconomic History  . Marital status: Married    Spouse name: Not on file  . Number of children: Not on file  . Years of education: Not on file  . Highest education level: Not on file  Occupational History  . Not on file  Tobacco Use  . Smoking status: Former Smoker   Types: Cigarettes    Quit date: 11/05/1992    Years since quitting: 27.2  . Smokeless tobacco: Never Used  Substance and Sexual Activity  . Alcohol use: Yes    Comment: occasional  . Drug use: No  . Sexual activity: Not on file  Other Topics Concern  . Not on file  Social History Narrative   RETIRED FROM TEACHING AND NOW DOING TAXES. MARRIED-KIDS: 1(BORN 2000). HUSBAND HAD A STROKE AND LIVES IN [place].   Social Determinants of Health   Financial Resource Strain:   . Difficulty of Paying Living Expenses:   Food Insecurity:   . Worried About Charity fundraiser in the Last Year:   . Arboriculturist in the Last Year:   Transportation Needs:   . Film/video editor (Medical):   Marland Kitchen Lack of Transportation (Non-Medical):   Physical Activity:   . Days of Exercise per Week:   . Minutes of Exercise per Session:   Stress:   . Feeling of Stress :   Social Connections:   . Frequency of Communication with Friends and Family:   . Frequency of Social Gatherings with Friends and Family:   . Attends Religious Services:   . Active Member of Clubs or Organizations:   . Attends Archivist Meetings:   Marland Kitchen Marital Status:    Family History  Problem Relation Age of Onset  . Cancer Mother        colorectal  . Colon cancer Mother        age > 29  . Heart disease Father   . Heart attack Father   . Diabetes Sister   . Asthma Sister   . Diabetes Maternal Grandmother   . Colon cancer Maternal Grandfather        age > 57, x2, deceased  . Heart attack Paternal Grandfather   . Hypertension Sister   . Hypertension Sister   . Colon polyps Neg Hx    No Known Allergies Prior to Admission medications   Medication Sig Start Date End Date Taking? Authorizing Provider  atorvastatin (LIPITOR) 20 MG tablet Take 1 tablet (20 mg total) by mouth daily. 12/14/19  Yes Rakes, Connye Burkitt, FNP  hydrochlorothiazide (HYDRODIURIL) 25 MG tablet Take 1 tablet (25 mg total) by mouth daily. (Needs to be seen before  next refill) 12/10/19  Yes Rakes, Connye Burkitt, FNP  HYDROcodone-acetaminophen (NORCO/VICODIN) 5-325 MG tablet Take 1 tablet by mouth at bedtime. 01/12/20  Yes [provider]  ibuprofen (ADVIL,MOTRIN) 200 MG tablet Take 400 mg by mouth every 6 (six) hours as needed for headache or moderate pain.   Yes [provider]  liraglutide (VICTOZA) 18 MG/3ML SOPN Inject 0.3 mLs (1.8 mg total) into the skin at bedtime. 12/10/19 01/18/20 Yes Rakes, Connye Burkitt, FNP  lisinopril (ZESTRIL) 5 MG tablet TAKE 1 TABLET (5 MG TOTAL) BY MOUTH DAILY. (NEEDS TO BE SEEN BEFORE NEXT REFILL) Patient taking differently: Take 5 mg by mouth daily.  12/18/19  Yes Rakes, Connye Burkitt, FNP  aspirin EC 81 MG tablet Take 1 tablet (81 mg total) by mouth daily. 12/14/19   Baruch Gouty, FNP  Insulin Pen Needle (B-D UF III MINI PEN NEEDLES) 31G X 5 MM MISC 1 Device by Does not apply route daily. 12/10/19   Baruch Gouty, FNP    ROS: All other systems have been reviewed and were otherwise negative with the exception of those mentioned in the HPI and as above.  Physical Exam: General: Alert, no acute distress Cardiovascular: No pedal edema Respiratory: No cyanosis, no use of accessory musculature GI: No organomegaly, abdomen is soft and non-tender Skin: No lesions in the area of chief complaint Neurologic: Sensation intact distally Psychiatric: Patient is competent for consent with normal mood and affect Lymphatic: No axillary or cervical lymphadenopathy  MUSCULOSKELETAL:  Right shoulder: She has gentle range of motion to 60 degrees without significant pain.  She has a click during range of motion and a subjective sense of instability.    Imaging: X-rays of the shoulder reviewed in [place] and demonstrate anterior bony Bankart fragment, though it is unclear how much of the glenoid is involved.  The remainder of her shoulder appears to be within normal limits.    CT right shoulder: "1. Mildly displaced intra-articular fracture of  the anterior inferior glenoid (osseous Bankart lesion). 2. No definite Hill-Sachs deformity.  The humeral head is located. 3. 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    Plan: Plan for Procedure(s): RIGHT TOTAL SHOULDER ARTHROPLASTY VS. REVERSE TOTAL SHOULDER ARTHROPLASTY   The risks benefits and alternatives were discussed with the patient including but not limited to the risks of nonoperative treatment, versus surgical intervention including infection, bleeding, nerve injury,  blood clots, cardiopulmonary complications, morbidity, mortality, among others, and they were willing to proceed.   We  additionally specifically discussed risks of axillary nerve injury, infection, periprosthetic fracture, continued pain and longevity of implants prior to beginning procedure.    Discussion was held with the patient regarding discharge home after surgery. If patient is found stable in PACU and pain is well controlled, she will be discharged home. Otherwise she will be admitted for observation after surgery. The patient is planning to be discharged home with outpatient PT.   The patient acknowledged the explanation, agreed to proceed with the plan and consent was signed.   Operative Plan: Right total shoulder arthroplasty vs reverse total shoulder Discharge Medications: Standard DVT Prophylaxis: N/A Physical Therapy: Outpatient PT Special Discharge needs: [place], PA-C  01/22/2020 7:36 AM

## 2020-01-24 MED ORDER — DEXTROSE 5 % IV SOLN
3.0000 g | INTRAVENOUS | Status: AC
  Administered 2020-01-25: 3 g via INTRAVENOUS
  Filled 2020-01-24: qty 3

## 2020-01-24 NOTE — Anesthesia Preprocedure Evaluation (Addendum)
Anesthesia Evaluation  Patient identified by MRN, date of birth, ID band Patient awake    Reviewed: Allergy & Precautions, NPO status , Patient's Chart, lab work & pertinent test results  Airway Mallampati: IV  TM Distance: >3 FB Neck ROM: Full  Mouth opening: Limited Mouth Opening Comment: Very limited mouth opening Dental no notable dental hx. (+) Teeth Intact, Dental Advisory Given   Pulmonary former smoker,  Former smoker, quit 1984- no inhalers   Pulmonary exam normal breath sounds clear to auscultation       Cardiovascular hypertension, Pt. on medications Normal cardiovascular exam Rhythm:Regular Rate:Normal  HLD   Neuro/Psych negative neurological ROS  negative psych ROS   GI/Hepatic negative GI ROS, Neg liver ROS,   Endo/Other  diabetes, Type 2, Oral Hypoglycemic AgentsMorbid obesityBMI 41  Renal/GU negative Renal ROS  negative genitourinary   Musculoskeletal Right humerus fx   Abdominal (+) + obese,   Peds  Hematology negative hematology ROS (+)   Anesthesia Other Findings   Reproductive/Obstetrics negative OB ROS S/p hysterectomy                          Anesthesia Physical Anesthesia Plan  ASA: III  Anesthesia Plan: General and Regional   Post-op Pain Management: GA combined w/ Regional for post-op pain   Induction: Intravenous  PONV Risk Score and Plan: 4 or greater and Ondansetron, Dexamethasone, Midazolam, Scopolamine patch - Pre-op and Treatment may vary due to age or medical condition  Airway Management Planned: Oral ETT and Video Laryngoscope Planned  Additional Equipment: None  Intra-op Plan:   Post-operative Plan: Extubation in OR  Informed Consent: I have reviewed the patients History and Physical, chart, labs and discussed the procedure including the risks, benefits and alternatives for the proposed anesthesia with the patient or authorized representative who  has indicated his/her understanding and acceptance.     Dental advisory given  Plan Discussed with: CRNA  Anesthesia Plan Comments:        Anesthesia Quick Evaluation

## 2020-01-25 ENCOUNTER — Encounter (HOSPITAL_COMMUNITY)
Admission: RE | Disposition: A | Discharge status: Home / Self Care | Payer: Self-pay | Source: Home / Self Care | Attending: Orthopaedic Surgery

## 2020-01-25 ENCOUNTER — Other Ambulatory Visit: Payer: Self-pay

## 2020-01-25 ENCOUNTER — Ambulatory Visit (HOSPITAL_COMMUNITY)
Admission: RE | Admit: 2020-01-25 | Discharge: 2020-01-26 | Disposition: A | Discharge status: Home / Self Care | Payer: BC Managed Care – PPO | Attending: Orthopaedic Surgery | Admitting: Orthopaedic Surgery

## 2020-01-25 ENCOUNTER — Ambulatory Visit (HOSPITAL_COMMUNITY): Payer: BC Managed Care – PPO | Admitting: Anesthesiology

## 2020-01-25 ENCOUNTER — Ambulatory Visit (HOSPITAL_COMMUNITY): Payer: BC Managed Care – PPO

## 2020-01-25 ENCOUNTER — Encounter (HOSPITAL_COMMUNITY): Payer: Self-pay | Admitting: Orthopaedic Surgery

## 2020-01-25 DIAGNOSIS — Z794 Long term (current) use of insulin: Secondary | ICD-10-CM | POA: Diagnosis not present

## 2020-01-25 DIAGNOSIS — S42143A Displaced fracture of glenoid cavity of scapula, unspecified shoulder, initial encounter for closed fracture: Secondary | ICD-10-CM | POA: Diagnosis present

## 2020-01-25 DIAGNOSIS — E785 Hyperlipidemia, unspecified: Secondary | ICD-10-CM | POA: Insufficient documentation

## 2020-01-25 DIAGNOSIS — S42153A Displaced fracture of neck of scapula, unspecified shoulder, initial encounter for closed fracture: Secondary | ICD-10-CM | POA: Diagnosis present

## 2020-01-25 DIAGNOSIS — Z8616 Personal history of COVID-19: Secondary | ICD-10-CM | POA: Insufficient documentation

## 2020-01-25 DIAGNOSIS — I10 Essential (primary) hypertension: Secondary | ICD-10-CM | POA: Diagnosis not present

## 2020-01-25 DIAGNOSIS — W19XXXA Unspecified fall, initial encounter: Secondary | ICD-10-CM | POA: Insufficient documentation

## 2020-01-25 DIAGNOSIS — Z09 Encounter for follow-up examination after completed treatment for conditions other than malignant neoplasm: Secondary | ICD-10-CM

## 2020-01-25 DIAGNOSIS — Z87891 Personal history of nicotine dependence: Secondary | ICD-10-CM | POA: Insufficient documentation

## 2020-01-25 DIAGNOSIS — Z7982 Long term (current) use of aspirin: Secondary | ICD-10-CM | POA: Diagnosis not present

## 2020-01-25 DIAGNOSIS — S42141A Displaced fracture of glenoid cavity of scapula, right shoulder, initial encounter for closed fracture: Secondary | ICD-10-CM | POA: Insufficient documentation

## 2020-01-25 DIAGNOSIS — E119 Type 2 diabetes mellitus without complications: Secondary | ICD-10-CM | POA: Diagnosis not present

## 2020-01-25 DIAGNOSIS — Z79899 Other long term (current) drug therapy: Secondary | ICD-10-CM | POA: Insufficient documentation

## 2020-01-25 HISTORY — PX: REVERSE SHOULDER ARTHROPLASTY: SHX5054

## 2020-01-25 LAB — GLUCOSE, CAPILLARY
Glucose-Capillary: 116 mg/dL — ABNORMAL HIGH (ref 70–99)
Glucose-Capillary: 136 mg/dL — ABNORMAL HIGH (ref 70–99)
Glucose-Capillary: 161 mg/dL — ABNORMAL HIGH (ref 70–99)
Glucose-Capillary: 214 mg/dL — ABNORMAL HIGH (ref 70–99)
Glucose-Capillary: 233 mg/dL — ABNORMAL HIGH (ref 70–99)

## 2020-01-25 IMAGING — DX DG SHOULDER 2+V PORT*R*
1 series · 1 of 1 positions shown · non-contrast
Comparison: None.

CLINICAL DATA: Status post right shoulder surgery

EXAM:
PORTABLE RIGHT SHOULDER

[shoulder ap]
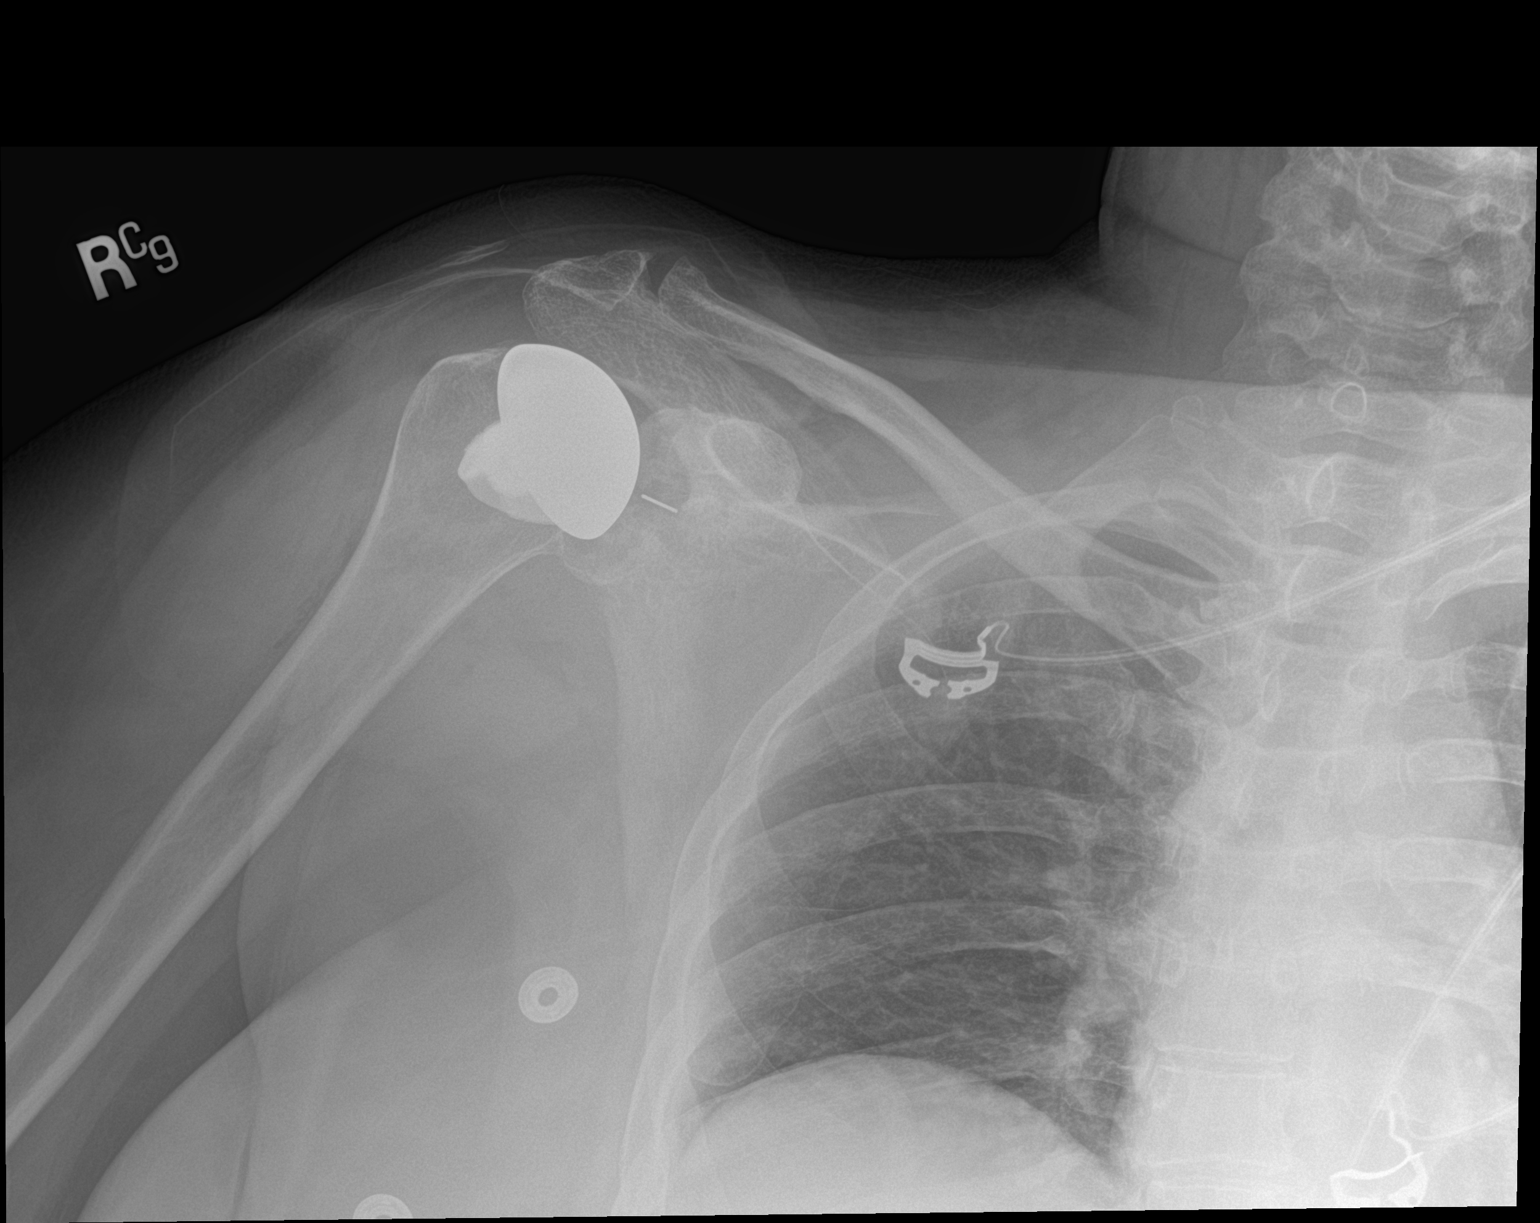

[1 of 1 positions shown; findings below may reference images not displayed]

FINDINGS: Right shoulder hemiarthroplasty changes are seen. No acute bony or
soft tissue abnormality is noted.
IMPRESSION: Postoperative change without acute abnormality.

## 2020-01-25 SURGERY — ARTHROPLASTY, SHOULDER, TOTAL, REVERSE
Anesthesia: Regional | Site: Shoulder | Laterality: Right

## 2020-01-25 MED ORDER — PHENYLEPHRINE HCL-NACL 10-0.9 MG/250ML-% IV SOLN
INTRAVENOUS | Status: DC | PRN
  Administered 2020-01-25: 80 ug/min via INTRAVENOUS
  Administered 2020-01-25: 40 ug/min via INTRAVENOUS

## 2020-01-25 MED ORDER — CEFAZOLIN SODIUM-DEXTROSE 1-4 GM/50ML-% IV SOLN
1.0000 g | Freq: Four times a day (QID) | INTRAVENOUS | Status: AC
  Administered 2020-01-25 – 2020-01-26 (×3): 1 g via INTRAVENOUS
  Filled 2020-01-25 (×3): qty 50

## 2020-01-25 MED ORDER — 0.9 % SODIUM CHLORIDE (POUR BTL) OPTIME
TOPICAL | Status: DC | PRN
  Administered 2020-01-25: 1000 mL

## 2020-01-25 MED ORDER — DOCUSATE SODIUM 100 MG PO CAPS
100.0000 mg | ORAL_CAPSULE | Freq: Two times a day (BID) | ORAL | Status: DC
  Administered 2020-01-25 – 2020-01-26 (×2): 100 mg via ORAL
  Filled 2020-01-25 (×2): qty 1

## 2020-01-25 MED ORDER — PROPOFOL 10 MG/ML IV BOLUS
INTRAVENOUS | Status: DC | PRN
  Administered 2020-01-25: 150 mg via INTRAVENOUS

## 2020-01-25 MED ORDER — ESMOLOL HCL 100 MG/10ML IV SOLN
INTRAVENOUS | Status: DC | PRN
  Administered 2020-01-25 (×2): 20 mg via INTRAVENOUS

## 2020-01-25 MED ORDER — DEXAMETHASONE SODIUM PHOSPHATE 10 MG/ML IJ SOLN
INTRAMUSCULAR | Status: AC
  Filled 2020-01-25: qty 1

## 2020-01-25 MED ORDER — METHOCARBAMOL 500 MG IVPB - SIMPLE MED
500.0000 mg | Freq: Four times a day (QID) | INTRAVENOUS | Status: DC | PRN
  Filled 2020-01-25: qty 50

## 2020-01-25 MED ORDER — ACETAMINOPHEN 500 MG PO TABS: 1000.0000 mg | ORAL_TABLET | Freq: Three times a day (TID) | ORAL | 0 refills | Status: AC

## 2020-01-25 MED ORDER — MENTHOL 3 MG MT LOZG: 1.0000 | LOZENGE | OROMUCOSAL | Status: DC | PRN

## 2020-01-25 MED ORDER — HYDROMORPHONE HCL 1 MG/ML IJ SOLN: 0.5000 mg | INTRAMUSCULAR | Status: DC | PRN

## 2020-01-25 MED ORDER — BUPIVACAINE LIPOSOME 1.3 % IJ SUSP
INTRAMUSCULAR | Status: DC | PRN
  Administered 2020-01-25: 10 mL via PERINEURAL

## 2020-01-25 MED ORDER — MIDAZOLAM HCL 5 MG/5ML IJ SOLN
INTRAMUSCULAR | Status: DC | PRN
  Administered 2020-01-25: 2 mg via INTRAVENOUS

## 2020-01-25 MED ORDER — KETOROLAC TROMETHAMINE 30 MG/ML IJ SOLN
INTRAMUSCULAR | Status: AC
  Filled 2020-01-25: qty 1

## 2020-01-25 MED ORDER — ROCURONIUM BROMIDE 100 MG/10ML IV SOLN
INTRAVENOUS | Status: DC | PRN
  Administered 2020-01-25: 60 mg via INTRAVENOUS
  Administered 2020-01-25: 10 mg via INTRAVENOUS

## 2020-01-25 MED ORDER — CELECOXIB 100 MG PO CAPS: 100.0000 mg | ORAL_CAPSULE | Freq: Two times a day (BID) | ORAL | 0 refills | Status: AC

## 2020-01-25 MED ORDER — POLYETHYLENE GLYCOL 3350 17 G PO PACK: 17.0000 g | PACK | Freq: Every day | ORAL | Status: DC | PRN

## 2020-01-25 MED ORDER — VANCOMYCIN HCL 1 G IV SOLR
INTRAVENOUS | Status: DC | PRN
  Administered 2020-01-25: 1000 mg via TOPICAL

## 2020-01-25 MED ORDER — HYDROCHLOROTHIAZIDE 25 MG PO TABS
25.0000 mg | ORAL_TABLET | Freq: Every day | ORAL | Status: DC
  Administered 2020-01-25: 25 mg via ORAL
  Filled 2020-01-25 (×2): qty 1

## 2020-01-25 MED ORDER — PHENYLEPHRINE 40 MCG/ML (10ML) SYRINGE FOR IV PUSH (FOR BLOOD PRESSURE SUPPORT)
PREFILLED_SYRINGE | INTRAVENOUS | Status: DC | PRN
  Administered 2020-01-25: 80 ug via INTRAVENOUS
  Administered 2020-01-25 (×2): 120 ug via INTRAVENOUS
  Administered 2020-01-25: 80 ug via INTRAVENOUS

## 2020-01-25 MED ORDER — METHOCARBAMOL 500 MG PO TABS
500.0000 mg | ORAL_TABLET | Freq: Four times a day (QID) | ORAL | Status: DC | PRN
  Administered 2020-01-25 – 2020-01-26 (×4): 500 mg via ORAL
  Filled 2020-01-25 (×4): qty 1

## 2020-01-25 MED ORDER — FENTANYL CITRATE (PF) 100 MCG/2ML IJ SOLN
INTRAMUSCULAR | Status: DC | PRN
  Administered 2020-01-25 (×2): 50 ug via INTRAVENOUS

## 2020-01-25 MED ORDER — SCOPOLAMINE 1 MG/3DAYS TD PT72
1.0000 | MEDICATED_PATCH | TRANSDERMAL | Status: DC
  Administered 2020-01-25: 1.5 mg via TRANSDERMAL
  Filled 2020-01-25: qty 1

## 2020-01-25 MED ORDER — OXYCODONE HCL 5 MG/5ML PO SOLN
5.0000 mg | Freq: Once | ORAL | Status: DC | PRN
  Filled 2020-01-25: qty 5

## 2020-01-25 MED ORDER — PHENOL 1.4 % MT LIQD: 1.0000 | OROMUCOSAL | Status: DC | PRN

## 2020-01-25 MED ORDER — STERILE WATER FOR IRRIGATION IR SOLN
Status: DC | PRN
  Administered 2020-01-25: 2000 mL

## 2020-01-25 MED ORDER — VANCOMYCIN HCL 1000 MG IV SOLR
INTRAVENOUS | Status: AC
  Filled 2020-01-25: qty 1000

## 2020-01-25 MED ORDER — HYDROMORPHONE HCL 1 MG/ML IJ SOLN
0.2500 mg | INTRAMUSCULAR | Status: DC | PRN
  Administered 2020-01-25 (×3): 0.5 mg via INTRAVENOUS

## 2020-01-25 MED ORDER — EPHEDRINE SULFATE-NACL 50-0.9 MG/10ML-% IV SOSY
PREFILLED_SYRINGE | INTRAVENOUS | Status: DC | PRN
  Administered 2020-01-25 (×2): 5 mg via INTRAVENOUS
  Administered 2020-01-25: 10 mg via INTRAVENOUS
  Administered 2020-01-25 (×2): 5 mg via INTRAVENOUS

## 2020-01-25 MED ORDER — ONDANSETRON HCL 4 MG PO TABS: 4.0000 mg | ORAL_TABLET | Freq: Three times a day (TID) | ORAL | 1 refills | Status: AC | PRN

## 2020-01-25 MED ORDER — PROMETHAZINE HCL 25 MG/ML IJ SOLN: 6.2500 mg | INTRAMUSCULAR | Status: DC | PRN

## 2020-01-25 MED ORDER — ACETAMINOPHEN 500 MG PO TABS
1000.0000 mg | ORAL_TABLET | Freq: Three times a day (TID) | ORAL | Status: AC
  Administered 2020-01-25 – 2020-01-26 (×4): 1000 mg via ORAL
  Filled 2020-01-25 (×4): qty 2

## 2020-01-25 MED ORDER — LIDOCAINE 2% (20 MG/ML) 5 ML SYRINGE
INTRAMUSCULAR | Status: DC | PRN
  Administered 2020-01-25: 20 mg via INTRAVENOUS

## 2020-01-25 MED ORDER — KETOROLAC TROMETHAMINE 30 MG/ML IJ SOLN
30.0000 mg | Freq: Once | INTRAMUSCULAR | Status: AC | PRN
  Administered 2020-01-25: 30 mg via INTRAVENOUS

## 2020-01-25 MED ORDER — ROCURONIUM BROMIDE 10 MG/ML (PF) SYRINGE
PREFILLED_SYRINGE | INTRAVENOUS | Status: AC
  Filled 2020-01-25: qty 10

## 2020-01-25 MED ORDER — OXYCODONE HCL 5 MG PO TABS: ORAL_TABLET | ORAL | 0 refills | Status: AC

## 2020-01-25 MED ORDER — ROPIVACAINE HCL 5 MG/ML IJ SOLN
INTRAMUSCULAR | Status: DC | PRN
  Administered 2020-01-25: 20 mL via PERINEURAL

## 2020-01-25 MED ORDER — LACTATED RINGERS IV SOLN: INTRAVENOUS | Status: DC

## 2020-01-25 MED ORDER — METOCLOPRAMIDE HCL 5 MG PO TABS: 5.0000 mg | ORAL_TABLET | Freq: Three times a day (TID) | ORAL | Status: DC | PRN

## 2020-01-25 MED ORDER — PROPOFOL 10 MG/ML IV BOLUS
INTRAVENOUS | Status: AC
  Filled 2020-01-25: qty 20

## 2020-01-25 MED ORDER — ONDANSETRON HCL 4 MG/2ML IJ SOLN
INTRAMUSCULAR | Status: DC | PRN
  Administered 2020-01-25 (×2): 4 mg via INTRAVENOUS

## 2020-01-25 MED ORDER — LISINOPRIL 5 MG PO TABS
5.0000 mg | ORAL_TABLET | Freq: Every day | ORAL | Status: DC
  Administered 2020-01-25: 5 mg via ORAL
  Filled 2020-01-25 (×2): qty 1

## 2020-01-25 MED ORDER — CHLORHEXIDINE GLUCONATE 4 % EX LIQD: 60.0000 mL | Freq: Once | CUTANEOUS | Status: DC

## 2020-01-25 MED ORDER — ONDANSETRON HCL 4 MG/2ML IJ SOLN: 4.0000 mg | Freq: Four times a day (QID) | INTRAMUSCULAR | Status: DC | PRN

## 2020-01-25 MED ORDER — ZOLPIDEM TARTRATE 5 MG PO TABS: 5.0000 mg | ORAL_TABLET | Freq: Every evening | ORAL | Status: DC | PRN

## 2020-01-25 MED ORDER — OXYCODONE HCL 5 MG PO TABS: 5.0000 mg | ORAL_TABLET | Freq: Once | ORAL | Status: DC | PRN

## 2020-01-25 MED ORDER — SODIUM CHLORIDE 0.9 % IR SOLN
Status: DC | PRN
  Administered 2020-01-25: 1000 mL

## 2020-01-25 MED ORDER — DEXAMETHASONE SODIUM PHOSPHATE 10 MG/ML IJ SOLN
INTRAMUSCULAR | Status: DC | PRN
  Administered 2020-01-25: 5 mg via INTRAVENOUS

## 2020-01-25 MED ORDER — CELECOXIB 200 MG PO CAPS
200.0000 mg | ORAL_CAPSULE | Freq: Two times a day (BID) | ORAL | Status: DC
  Administered 2020-01-25 – 2020-01-26 (×2): 200 mg via ORAL
  Filled 2020-01-25 (×2): qty 1

## 2020-01-25 MED ORDER — HYDROMORPHONE HCL 1 MG/ML IJ SOLN
INTRAMUSCULAR | Status: AC
  Filled 2020-01-25: qty 1

## 2020-01-25 MED ORDER — EPHEDRINE 5 MG/ML INJ
INTRAVENOUS | Status: AC
  Filled 2020-01-25: qty 10

## 2020-01-25 MED ORDER — DIPHENHYDRAMINE HCL 12.5 MG/5ML PO ELIX: 12.5000 mg | ORAL_SOLUTION | ORAL | Status: DC | PRN

## 2020-01-25 MED ORDER — FENTANYL CITRATE (PF) 100 MCG/2ML IJ SOLN
INTRAMUSCULAR | Status: AC
  Filled 2020-01-25: qty 2

## 2020-01-25 MED ORDER — OXYCODONE HCL 5 MG PO TABS
5.0000 mg | ORAL_TABLET | ORAL | Status: DC | PRN
  Administered 2020-01-26: 5 mg via ORAL
  Filled 2020-01-25: qty 1

## 2020-01-25 MED ORDER — MAGNESIUM CITRATE PO SOLN: 1.0000 | Freq: Once | ORAL | Status: DC | PRN

## 2020-01-25 MED ORDER — ONDANSETRON HCL 4 MG/2ML IJ SOLN
INTRAMUSCULAR | Status: AC
  Filled 2020-01-25: qty 4

## 2020-01-25 MED ORDER — OMEPRAZOLE 20 MG PO CPDR: 20.0000 mg | DELAYED_RELEASE_CAPSULE | Freq: Every day | ORAL | 0 refills | Status: DC

## 2020-01-25 MED ORDER — METOCLOPRAMIDE HCL 5 MG/ML IJ SOLN: 5.0000 mg | Freq: Three times a day (TID) | INTRAMUSCULAR | Status: DC | PRN

## 2020-01-25 MED ORDER — LIRAGLUTIDE 18 MG/3ML ~~LOC~~ SOPN: 1.8000 mg | PEN_INJECTOR | Freq: Every day | SUBCUTANEOUS | Status: DC

## 2020-01-25 MED ORDER — LIDOCAINE 2% (20 MG/ML) 5 ML SYRINGE
INTRAMUSCULAR | Status: AC
  Filled 2020-01-25: qty 5

## 2020-01-25 MED ORDER — ONDANSETRON HCL 4 MG PO TABS: 4.0000 mg | ORAL_TABLET | Freq: Four times a day (QID) | ORAL | Status: DC | PRN

## 2020-01-25 MED ORDER — ATORVASTATIN CALCIUM 20 MG PO TABS
20.0000 mg | ORAL_TABLET | Freq: Every day | ORAL | Status: DC
  Administered 2020-01-25: 20 mg via ORAL
  Filled 2020-01-25 (×2): qty 1

## 2020-01-25 MED ORDER — MEPERIDINE HCL 50 MG/ML IJ SOLN: 6.2500 mg | INTRAMUSCULAR | Status: DC | PRN

## 2020-01-25 MED ORDER — MIDAZOLAM HCL 2 MG/2ML IJ SOLN
INTRAMUSCULAR | Status: AC
  Filled 2020-01-25: qty 2

## 2020-01-25 MED ORDER — SUGAMMADEX SODIUM 200 MG/2ML IV SOLN
INTRAVENOUS | Status: DC | PRN
  Administered 2020-01-25: 250 mg via INTRAVENOUS

## 2020-01-25 MED ORDER — ACETAMINOPHEN 500 MG PO TABS
1000.0000 mg | ORAL_TABLET | Freq: Once | ORAL | Status: AC
  Administered 2020-01-25: 1000 mg via ORAL
  Filled 2020-01-25: qty 2

## 2020-01-25 MED ORDER — BISACODYL 10 MG RE SUPP: 10.0000 mg | Freq: Every day | RECTAL | Status: DC | PRN

## 2020-01-25 MED ORDER — TRANEXAMIC ACID-NACL 1000-0.7 MG/100ML-% IV SOLN
1000.0000 mg | INTRAVENOUS | Status: AC
  Administered 2020-01-25: 1000 mg via INTRAVENOUS
  Filled 2020-01-25: qty 100

## 2020-01-25 SURGICAL SUPPLY — 66 items
AID PSTN UNV HD RSTRNT DISP (MISCELLANEOUS) ×1
APL PRP STRL LF DISP 70% ISPRP (MISCELLANEOUS) ×2
BLADE SAW SAG 73X25 THK (BLADE) ×2
BLADE SAW SGTL 73X25 THK (BLADE) ×1 IMPLANT
CEMENT BONE DEPUY (Cement) ×2 IMPLANT
CHLORAPREP W/TINT 26 (MISCELLANEOUS) ×6 IMPLANT
CLOSURE STERI-STRIP 1/2X4 (GAUZE/BANDAGES/DRESSINGS) ×1
CLOSURE WOUND 1/2 X4 (GAUZE/BANDAGES/DRESSINGS) ×1
CLSR STERI-STRIP ANTIMIC 1/2X4 (GAUZE/BANDAGES/DRESSINGS) ×2 IMPLANT
COMP GLENOID AEQUALIS CL S30 (Shoulder) ×3 IMPLANT
COMPONENT GLENOD AEQLS CL S30 (Shoulder) IMPLANT
COOLER ICEMAN CLASSIC (MISCELLANEOUS) IMPLANT
COVER BACK TABLE 60X90IN (DRAPES) IMPLANT
COVER SURGICAL LIGHT HANDLE (MISCELLANEOUS) ×3 IMPLANT
COVER WAND RF STERILE (DRAPES) ×3 IMPLANT
DRAPE INCISE IOBAN 66X45 STRL (DRAPES) ×3 IMPLANT
DRAPE ORTHO SPLIT 77X108 STRL (DRAPES) ×6
DRAPE SHEET LG 3/4 BI-LAMINATE (DRAPES) ×6 IMPLANT
DRAPE SURG ORHT 6 SPLT 77X108 (DRAPES) ×2 IMPLANT
DRSG AQUACEL AG ADV 3.5X 6 (GAUZE/BANDAGES/DRESSINGS) ×3 IMPLANT
ELECT BLADE TIP CTD 4 INCH (ELECTRODE) ×3 IMPLANT
ELECT REM PT RETURN 15FT ADLT (MISCELLANEOUS) ×3 IMPLANT
GLOVE BIO SURGEON STRL SZ 6.5 (GLOVE) ×4 IMPLANT
GLOVE BIO SURGEONS STRL SZ 6.5 (GLOVE) ×2
GLOVE BIOGEL PI IND STRL 6.5 (GLOVE) ×1 IMPLANT
GLOVE BIOGEL PI IND STRL 8 (GLOVE) ×1 IMPLANT
GLOVE BIOGEL PI INDICATOR 6.5 (GLOVE) ×2
GLOVE BIOGEL PI INDICATOR 8 (GLOVE) ×2
GLOVE ECLIPSE 8.0 STRL XLNG CF (GLOVE) ×6 IMPLANT
GOWN STRL REUS W/TWL LRG LVL3 (GOWN DISPOSABLE) ×3 IMPLANT
GOWN STRL REUS W/TWL XL LVL3 (GOWN DISPOSABLE) ×3 IMPLANT
GUIDE PIN 3X75 SHOULDER (PIN) ×3
GUIDEWIRE GLENOID 2.5X220 (WIRE) ×2 IMPLANT
HANDPIECE INTERPULSE COAX TIP (DISPOSABLE) ×3
HEAD HUMERAL 44X18 (Miscellaneous) ×2 IMPLANT
HEMOSTAT SURGICEL 2X14 (HEMOSTASIS) IMPLANT
KIT BASIN OR (CUSTOM PROCEDURE TRAY) ×3 IMPLANT
KIT STABILIZATION SHOULDER (MISCELLANEOUS) ×3 IMPLANT
KIT TURNOVER KIT A (KITS) IMPLANT
MANIFOLD NEPTUNE II (INSTRUMENTS) ×3 IMPLANT
NDL MAYO CATGUT SZ4 TPR NDL (NEEDLE) IMPLANT
NEEDLE MAYO CATGUT SZ4 (NEEDLE) IMPLANT
NS IRRIG 1000ML POUR BTL (IV SOLUTION) ×3 IMPLANT
NUCLEUS SHOULDER SZ 2 (Miscellaneous) IMPLANT
NUCLEUS SZ 2 (Miscellaneous) ×3 IMPLANT
PACK SHOULDER (CUSTOM PROCEDURE TRAY) ×3 IMPLANT
PAD COLD SHLDR WRAP-ON (PAD) IMPLANT
PENCIL SMOKE EVACUATOR (MISCELLANEOUS) IMPLANT
PIN GUIDE 3X75 SHOULDER (PIN) IMPLANT
RESTRAINT HEAD UNIVERSAL NS (MISCELLANEOUS) ×3 IMPLANT
SET HNDPC FAN SPRY TIP SCT (DISPOSABLE) ×1 IMPLANT
SLING ULTRA III MED (ORTHOPEDIC SUPPLIES) ×3 IMPLANT
STRIP CLOSURE SKIN 1/2X4 (GAUZE/BANDAGES/DRESSINGS) ×1 IMPLANT
SUCTION FRAZIER HANDLE 12FR (TUBING) ×3
SUCTION TUBE FRAZIER 12FR DISP (TUBING) ×1 IMPLANT
SUT ETHIBOND 2 V 37 (SUTURE) ×3 IMPLANT
SUT ETHIBOND NAB CT1 #1 30IN (SUTURE) ×3 IMPLANT
SUT FIBERWIRE #5 38 CONV NDL (SUTURE) ×12
SUT MNCRL AB 4-0 PS2 18 (SUTURE) ×3 IMPLANT
SUT VIC AB 0 CT1 36 (SUTURE) ×3 IMPLANT
SUT VIC AB 3-0 SH 27 (SUTURE) ×3
SUT VIC AB 3-0 SH 27X BRD (SUTURE) ×1 IMPLANT
SUT VIC AB 3-0 SH 8-18 (SUTURE) ×3 IMPLANT
SUTURE FIBERWR #5 38 CONV NDL (SUTURE) ×4 IMPLANT
TOWEL OR 17X26 10 PK STRL BLUE (TOWEL DISPOSABLE) ×3 IMPLANT
WATER STERILE IRR 1000ML POUR (IV SOLUTION) ×6 IMPLANT

## 2020-01-25 NOTE — Anesthesia Procedure Notes (Signed)
Anesthesia Regional Block: Interscalene brachial plexus block   Pre-Anesthetic Checklist: ,, timeout performed, Correct Patient, Correct Site, Correct Laterality, Correct Procedure, Correct Position, site marked, Risks and benefits discussed,  Surgical consent,  Pre-op evaluation,  At surgeon's request and post-op pain management  Laterality: Right  Prep: Maximum Sterile Barrier Precautions used, chloraprep       Needles:  Injection technique: Single-shot  Needle Type: Echogenic Stimulator Needle     Needle Length: 9cm  Needle Gauge: 22     Additional Needles:   Procedures:,,,, ultrasound used (permanent image in chart),,,,  Narrative:  Start time: 01/25/2020 6:45 AM End time: 01/25/2020 6:55 AM Injection made incrementally with aspirations every 5 mL.  Performed by: Personally  Anesthesiologist: Pervis Hocking, DO  Additional Notes: Monitors applied. No increased pain on injection. No increased resistance to injection. Injection made in 5cc increments. Good needle visualization. Patient tolerated procedure well.

## 2020-01-25 NOTE — Interval H&P Note (Signed)
We discussed that total shoulder arthroplasty may be feasible. We will have reverse on back up. All questions answered.

## 2020-01-25 NOTE — Op Note (Signed)
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  Patient's glenoid fracture was clearly malunited and the anterior 40% of the glenoid was involved.  The cartilage was relatively intact however clearly there was a massive step-off and this led to instability which was noted preoperatively in the exam under anesthesia.  There was moderate to severe central glenoid and humeral head wear consistent with instability and early arthritis.  We had great fixation of her glenoid as well as humeral components.  Good repair of the subscapularis as well.  Post-operative plan: The patient will be NWB in sling.  The patient will be admitted overnight.  DVT prophylaxis not indicated in isolated upper extremity surgery patient with no specific risks factors.  Pain control with PRN pain medication preferring oral medicines.  Follow up plan will be scheduled in approximately 7 days for incision check and XR.  Physical therapy to start after first visit.  Implants: Tornier stemless size 2 nucleus simplicity, 44 ST B head, small 30 mm polyethylene.  Post-Op Diagnosis: Same Surgeons:Primary: Hiram Gash, MD Assistants:Caroline McBane PA-C Location: [facility] Anesthesia: General with Exparel Interscalene Antibiotics: Ancef 2g preop, Vancomycin 1000mg  locally Tourniquet time: None Estimated Blood Loss: [id number] Complications: None Specimens: None Implants: Implant Name Type Inv. Item Serial No. Manufacturer Lot No. LRB No. Used Action  CEMENT BONE DEPUY - [id number] Cement CEMENT BONE DEPUY  DEPUY SYNTHES [id number] Right 1 Implanted  COMP GLENOID AEQUALIS CL S30 - [id number] Shoulder COMP GLENOID AEQUALIS CL S30 [id number] [employer]  Right 1 Implanted   NUCLEUS SZ 2 - [id number] Miscellaneous NUCLEUS SZ 2 [id number] [employer]  Right 1 Implanted  STB Humeral Head   [id number] [employer]  Right 1 Implanted    Indications for Surgery:   Brandy Hernandez is a 54 y.o. female with fall resulting in a anterior glenoid fracture and chronic instability that was referred to me from another practice.  Patient's cuff testing appeared normal and we felt that at her age anatomic total shoulder versus reverse shoulder arthroplasty may lead to good outcome and increased instability.  Benefits and risks of operative and nonoperative management were discussed prior to surgery with patient/guardian(s) and informed consent form was completed.  Infection and need for further surgery were discussed as was prosthetic stability and cuff issues.  We additionally specifically discussed risks of axillary nerve injury, infection, periprosthetic fracture, continued pain and longevity of implants prior to beginning procedure.      Procedure:   The patient was identified in the preoperative holding area where the surgical site was marked. Block placed by anesthesia with exparel.  The patient was taken to the OR where a procedural timeout was called and the above noted anesthesia was induced.  The patient was positioned beachchair on allen table with spider arm positioner.  Preoperative antibiotics were dosed.  The patient's right shoulder was prepped and draped in the usual sterile fashion.  A second preoperative timeout was called.       Standard deltopectoral approach was performed with a #10 blade. We dissected down to the subcutaneous tissues and the cephalic vein was taken laterally with the deltoid. Clavipectoral fascia was incised in line with the incision. Deep retractors were placed. The long of the biceps tendon was identified and there  was significant tenosynovitis present.  Tenodesis was performed to the pectoralis tendon with #2 Ethibond. The remaining biceps  was followed up into the rotator interval where it was released. The subscapularis was taken down with a lesser tuberosity osteotomy using an osteotome. The osteotomy fragment and underlying capsular elevated off of the humeral neck and the osteophytes inferiorly. #2 Ethibond sutures are passed through the bone tendon junction for subscap manipulation. We continued releasing the capsule directly off of the osteophytes inferiorly all the way around the corner. This allowed [place] to dislocate the humeral head. The humeral head had evidence of severe osteoarthritic wear with full-thickness cartilage loss centrally on the humeral head. There was not significant flattening of the humeral head.   The rotator cuff was carefully examined and noted to be intact without sign of wear.  The decision was confirmed that an anatomic total shoulder was indicated for this patient.  There were osteophytes along the inferior humeral neck. The osteophytes were removed with an osteotome and a rongeur.  Osteophytes were removed with a rongeur and an osteotome and the anatomic neck was well visualized.   We next made our humeral osteotomy with an oscillating saw along the anatomic neck. The head fragment was passed off the back table and measured approximately 46.  Bone quality was reasonable and we decided that it was appropriate to use simpliciti stemless implants and placed a guidepin perpendicular to our cut using a guide.  This obtained bicortical purchase.  We then reamed off of this to a flat surface and drilled and placed our nucleus.  A cut protector was placed.   The subscapularis was again identified and immediately we took care to palpate the axillary nerve anteriorly and verify its position with gentle palpation as well as the tug test.  We then released the SGHL with bovie cautery prior to placing a curved mayo at the junction of the anterior glenoid well above the axillary nerve and bluntly dissecting the  subscapularis from the capsule.  We then carefully protected the axillary nerve as we gently released the inferior capsule to fully mobilize the subscapularis.  An anterior deltoid retractor was then placed as well as a small Hohmann retractor superiorly.    The glenoid was inspected had moderate wear centrally with an anterior 40% fracture which was malunited medially.  The remaining labrum was removed circumferentially taking great care not to disrupt the posterior capsule. The glenoid was sized as listed in the implants above. The center hole was marked and we used a glenoid drill guide to place a guide pin in the center position.  The glenoid was reamed concentrically over the guide pin. Next the center hole was enlarged and the drill guide for the peripheral pegs was placed. The vault was intact. The peripheral pegs were drilled in standard fashion. A trial glenoid was placed and was stable. We removed the trial components.   The glenoid bone was prepared with pulsatile lavage and a sponge to dry the bone prior to cement applicatio. Cement was mixed on the back table the peripheral peg holes were cemented. The glenoid was impacted securely.   We turned attention back to the humeral side. The cut protector was removed. We trialed with multiple size head options and selected a 44 ST B which re-created the patient's anatomy. The offset was dialed in to match the normal anatomy. The shoulder was trialed.  There was good ROM in all planes and the shoulder was stable with approximately 50%  posterior spring back and no inferior translation.  The real humeral implants were opened on the back table and assembled.  The trial was removed. #5 Fiberwire sutures passed through the humeral neck for subscap repair. The humeral component was press-fit obtaining a secure fit.  The joint was reduced and thoroughly irrigated with pulsatile lavage. Subscap and lesser tuberosity osteotomy repaired back in a double row  fashion with #5 Fiberwire sutures through bone tunnels. Next the rotator interval was closed with #2 ethibond suture. Hemostasis was obtained. The deltopectoral interval was reapproximated with #1 Ethibond. The subcutaneous tissues were closed with 2-0 Vicryl and the skin was closed with a running monocryl. The incisions were cleaned and dried and an Aquacel dressing was placed. The drapes taken down. The arm was placed into sling with abduction pillow. Patient was awakened, extubated, and transferred to the recovery room in stable condition. There were no intraoperative complications. The sponge, needle, and attention counts were correct at the end of the case.    Noemi Chapel, PA-C, present and scrubbed throughout the case, critical for completion in a timely fashion, and for retraction, instrumentation, closure.

## 2020-01-25 NOTE — Transfer of Care (Signed)
Immediate Anesthesia Transfer of Care Note  Patient: Brandy Hernandez  Procedure(s) Performed: TOTAL SHOULDER ARTHROPLASTY (Right Shoulder)  Patient Location: PACU  Anesthesia Type:General and Regional  Level of Consciousness: drowsy  Airway & Oxygen Therapy: Patient Spontanous Breathing and Patient connected to face mask oxygen  Post-op Assessment: Report given to RN and Post -op Vital signs reviewed and stable  Post vital signs: Reviewed and stable  Last Vitals:  Vitals Value Taken Time  BP 120/79 01/25/20 0930  Temp    Pulse 96 01/25/20 0931  Resp 15 01/25/20 0931  SpO2 100 % 01/25/20 0931  Vitals shown include unvalidated device data.  Last Pain:  Vitals:   01/25/20 0615  TempSrc: Oral  PainSc:       Patients Stated Pain Goal: 2 ([id number])  Complications: No apparent anesthesia complications

## 2020-01-25 NOTE — Anesthesia Procedure Notes (Signed)
Procedure Name: Intubation Date/Time: 01/25/2020 7:25 AM Performed by: Gwyndolyn Saxon, CRNA Pre-anesthesia Checklist: Patient identified, Emergency Drugs available, Suction available and Patient being monitored Patient Re-evaluated:Patient Re-evaluated prior to induction Oxygen Delivery Method: Circle system utilized Preoxygenation: Pre-oxygenation with 100% oxygen Induction Type: IV induction Ventilation: Mask ventilation without difficulty Laryngoscope Size: Glidescope and 3 Grade View: Grade I Tube type: Oral Tube size: 7.0 mm Number of attempts: 1 Airway Equipment and Method: Patient positioned with wedge pillow and Stylet Placement Confirmation: ETT inserted through vocal cords under direct vision,  positive ETCO2 and breath sounds checked- equal and bilateral Secured at: 22 cm Tube secured with: Tape Dental Injury: Teeth and Oropharynx as per pre-operative assessment  Difficulty Due To: Difficulty was anticipated, Difficult Airway- due to anterior larynx and Difficult Airway- due to limited oral opening Comments: Easy mask without oral airway

## 2020-01-25 NOTE — Anesthesia Postprocedure Evaluation (Signed)
Anesthesia Post Note  Patient: Brandy Hernandez  Procedure(s) Performed: TOTAL SHOULDER ARTHROPLASTY (Right Shoulder)     Patient location during evaluation: PACU Anesthesia Type: Regional and General Level of consciousness: awake and alert, oriented and patient cooperative Pain management: pain level controlled Vital Signs Assessment: post-procedure vital signs reviewed and stable Respiratory status: spontaneous breathing, nonlabored ventilation and respiratory function stable Cardiovascular status: blood pressure returned to baseline and stable Postop Assessment: no apparent nausea or vomiting Anesthetic complications: no    Last Vitals:  Vitals:   01/25/20 1000 01/25/20 1015  BP: 108/66 102/65  Pulse: 85 76  Resp: 13 14  Temp:    SpO2: 97% 97%    Last Pain:  Vitals:   01/25/20 1015  TempSrc:   PainSc: [place]

## 2020-01-26 DIAGNOSIS — S42141A Displaced fracture of glenoid cavity of scapula, right shoulder, initial encounter for closed fracture: Secondary | ICD-10-CM | POA: Diagnosis not present

## 2020-01-26 LAB — GLUCOSE, CAPILLARY: Glucose-Capillary: 135 mg/dL — ABNORMAL HIGH (ref 70–99)

## 2020-01-26 MED ORDER — SODIUM CHLORIDE 0.9 % IV BOLUS
500.0000 mL | Freq: Once | INTRAVENOUS | Status: AC
  Administered 2020-01-26: 500 mL via INTRAVENOUS

## 2020-01-26 MED ORDER — METHOCARBAMOL 500 MG PO TABS: 500.0000 mg | ORAL_TABLET | Freq: Three times a day (TID) | ORAL | 0 refills | Status: DC | PRN

## 2020-01-26 NOTE — Progress Notes (Signed)
Assisted patient when lethargic in the bathroom with NT, OT present and Nanine Means, RN. Patient bp documented 63/40 and patient coloration not well/ pale and skin clammy. Lifted pt feet up and applied cold washcloth to head. Patient more alert and bp 88/49. Patient had been saline locked due to po intake sufficient; iv fluids restarted at 125 cc/hr according to patient standing orders. Notified Dr. Griffin Basil and bolus of 500 cc of NS started. Assisted up to chair with 2 person assist and rolled out of restroom in recliner. Patient stated "I am feeling better and skin coloration returning to baseline. Notified PA-C Noemi Chapel of changes and orthostatic vitals taken at 1420. Sitting BP 110/68. Standing 108/58. Noemi Chapel PA-C says that patient can return home as long as she is comfortable. Patient wishes to return home and will have help at home. No longer dizzy when standing or ambulating. BP stable, skin no longer clammy. Instructed patient to check BP when at home before taking meds and to change positions slow at home.

## 2020-01-26 NOTE — Evaluation (Signed)
Occupational Therapy Evaluation Patient Details Name: BRINAE REINHOLTZ MRN: [id number] DOB: 29-Dec-1965 Today's Date: 01/26/2020    History of Present Illness TOTAL SHOULDER ARTHROPLASTY (Right Shoulder)   Clinical Impression   Upon getting in BR during OT eval pt had a BM and became lethargic.  BP 66/49. RN called to room.  Pt able to get to chair and RN giving bolus.  Pts BP 104/66 after getting to chair.   Follow Up Recommendations  Follow surgeon's recommendation for DC plan and follow-up therapies    Equipment Recommendations  None recommended by OT    Recommendations for Other Services       Precautions / Restrictions Precautions Precautions: Shoulder Required Braces or Orthoses: Sling Restrictions Weight Bearing Restrictions: Yes RUE Weight Bearing: Non weight bearing      Mobility Bed Mobility               General bed mobility comments: pt in chair  Transfers Overall transfer level: Needs assistance Equipment used: 1 person hand held assist Transfers: Sit to/from Stand;Stand Pivot Transfers Sit to Stand: Min assist Stand pivot transfers: Min assist                ADL either performed or assessed with clinical judgement         Vision Baseline Vision/History: No visual deficits              Pertinent Vitals/Pain Pain Assessment: 0-10 Pain Score: 3  Pain Descriptors / Indicators: Guarding Pain Intervention(s): Monitored during session     Hand Dominance     Extremity/Trunk Assessment Upper Extremity Assessment Upper Extremity Assessment: RUE deficits/detail RUE Deficits / Details: s/pTOTAL SHOULDER ARTHROPLASTY (Right Shoulder)           Communication     Cognition Arousal/Alertness: Awake/alert Behavior During Therapy: WFL for tasks assessed/performed Overall Cognitive Status: Within Functional Limits for tasks assessed                                           Shoulder Instructions Shoulder  Instructions Donning/doffing shirt without moving shoulder: Minimal assistance Method for sponge bathing under operated UE: Minimal assistance Donning/doffing sling/immobilizer: Minimal assistance Correct positioning of sling/immobilizer: Minimal assistance ROM for elbow, wrist and digits of operated UE: Minimal assistance Sling wearing schedule (on at all times/off for ADL's): Minimal assistance Proper positioning of operated UE when showering: Minimal assistance Positioning of UE while sleeping: Minimal assistance    Home Living Family/patient expects to be discharged to:: Private residence                                                 OT Problem List: Decreased strength;Decreased range of motion;Decreased activity tolerance;Impaired balance (sitting and/or standing);Impaired UE functional use      OT Treatment/Interventions: Self-care/ADL training;Patient/family education;DME and/or AE instruction    OT Goals(Current goals can be found in the care plan section) Acute Rehab OT Goals Patient Stated Goal: home today OT Goal Formulation: With patient Time For Goal Achievement: 02/02/20 Potential to Achieve Goals: Good  OT Frequency: Min 2X/week    AM-PAC OT "6 Clicks" Daily Activity     Outcome Measure Help from another person eating meals?: A Little Help from another person taking care of  personal grooming?: A Little Help from another person toileting, which includes using toliet, bedpan, or urinal?: A Little Help from another person bathing (including washing, rinsing, drying)?: A Little Help from another person to put on and taking off regular upper body clothing?: A Little Help from another person to put on and taking off regular lower body clothing?: A Little 6 Click Score: 18   End of Session Equipment Utilized During Treatment: Other (comment)(sling) Nurse Communication: Mobility status  Activity Tolerance:   Patient left: in chair;with call  bell/phone within reach                   Time: 1050-1119 OT Time Calculation (min): 29 min Charges:  OT General Charges $OT Visit: 1 Visit OT Treatments $Self Care/Home Management : 8-22 mins  Kari Baars, [place] [phone] Office- [phone]     [place], Edwena Felty D 01/26/2020, 12:09 PM

## 2020-01-26 NOTE — Progress Notes (Signed)
Occupational Therapy Treatment Patient Details Name: Brandy Hernandez MRN: [id number] DOB: 22-Apr-1966 Today's Date: 01/26/2020    History of present illness TOTAL SHOULDER ARTHROPLASTY (Right Shoulder)   OT comments  2 sisters will A pt as needed.  Pts BP 96./ 68 2nd OT session. Rn aware  Follow Up Recommendations  Follow surgeon's recommendation for DC plan and follow-up therapies    Equipment Recommendations  None recommended by OT    Recommendations for Other Services      Precautions / Restrictions Precautions Precautions: Shoulder Required Braces or Orthoses: Sling Restrictions Weight Bearing Restrictions: Yes RUE Weight Bearing: Non weight bearing       Mobility Bed Mobility               General bed mobility comments: pt in chair  Transfers Overall transfer level: Needs assistance Equipment used: 1 person hand held assist Transfers: Sit to/from Stand;Stand Pivot Transfers Sit to Stand: Supervision Stand pivot transfers: Supervision                ADL either performed or assessed with clinical judgement   ADL Overall ADL's : Needs assistance/impaired                         Toilet Transfer: Minimal assistance;Stand-pivot;Cueing for safety;Cueing for sequencing   Toileting- Clothing Manipulation and Hygiene: Minimal assistance;Sit to/from stand;Cueing for compensatory techniques;Cueing for safety         General ADL Comments: 2 sisters will A as needed     Vision Baseline Vision/History: No visual deficits            Cognition Arousal/Alertness: Awake/alert Behavior During Therapy: WFL for tasks assessed/performed Overall Cognitive Status: Within Functional Limits for tasks assessed                                          Exercises  pt overall min A and able to verbalize correct exerciseas   Shoulder Instructions Shoulder Instructions Donning/doffing shirt without moving shoulder: Minimal  assistance Method for sponge bathing under operated UE: Minimal assistance Donning/doffing sling/immobilizer: Minimal assistance Correct positioning of sling/immobilizer: Minimal assistance ROM for elbow, wrist and digits of operated UE: Minimal assistance Sling wearing schedule (on at all times/off for ADL's): Minimal assistance Proper positioning of operated UE when showering: Minimal assistance Positioning of UE while sleeping: Minimal assistance     General Comments      Pertinent Vitals/ Pain       Pain Assessment: 0-10 Pain Score: 2  Pain Descriptors / Indicators: Guarding;Dull Pain Intervention(s): Limited activity within patient's tolerance;Monitored during session  Home Living Family/patient expects to be discharged to:: Private residence                                            Frequency  Min 2X/week        Progress Toward Goals  OT Goals(current goals can now be found in the care plan section)     Acute Rehab OT Goals Patient Stated Goal: home today OT Goal Formulation: With patient Time For Goal Achievement: 02/02/20 Potential to Achieve Goals: Good  Plan Discharge plan remains appropriate       AM-PAC OT "6 Clicks" Daily Activity     Outcome Measure  Help from another person eating meals?: A Little Help from another person taking care of personal grooming?: A Little Help from another person toileting, which includes using toliet, bedpan, or urinal?: A Little Help from another person bathing (including washing, rinsing, drying)?: A Little Help from another person to put on and taking off regular upper body clothing?: A Little Help from another person to put on and taking off regular lower body clothing?: A Little 6 Click Score: 18    End of Session Equipment Utilized During Treatment: Other (comment)(sling)  OT Visit Diagnosis: Muscle weakness (generalized) (M62.81)      Patient Left in chair;with call bell/phone within  reach   Nurse Communication Mobility status        Time: [id number] OT Time Calculation (min): 29 min  Charges: OT General Charges $OT Visit: 1 Visit OT Evaluation $OT Eval Moderate Complexity: 1 Mod OT Treatments $Self Care/Home Management : 8-22 mins     Malvina Schadler D 01/26/2020, 2:49 PM

## 2020-01-26 NOTE — Discharge Summary (Signed)
Patient ID: Brandy Hernandez MRN: [id number] DOB/AGE: 54-11-1965 54 y.o.  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 Basal cell carcinoma   . COVID-19   . Diabetes mellitus without complication ([place])   . Hypertension     Procedures Performed: Right anatomic stemless total Shoulder Arthroplasty  Discharged Condition: good/stable  Hospital Course: Patient brought in to [facility] for surgery. She tolerated procedure well.  She was kept for monitoring overnight for pain control and medical monitoring postop. She was found to be stable for DC home the morning after surgery.  Patient was instructed on specific activity restrictions and all questions were answered.  Consults: OT  Significant Diagnostic Studies: No additional pertinent studies  Treatments: Surgery  Discharge Exam: Right upper extremity: Dressing CDI and sling well fitting,  full and painless ROM throughout hand with DPC of 0. + Motor in  AIN, PIN, Ulnar distributions. Axillary nerve sensation preserved and symmetric.  Sensation intact in medial, radial, and ulnar distributions. Well perfused digits.    Disposition: Discharge disposition: 01-Home or Self Care     Weight-bearing status: NWB RUE Follow-up: One week in office for incision check and x-ray. Outpatient PT to start after first visit Post-op medications: Reviewed. Medications sent electronically to patient's local pharmacy.   Discharge Instructions    Call MD for:  redness, tenderness, or signs of infection (pain, swelling, redness, odor or green/yellow discharge around incision site)   Complete by: As directed    Call MD for:  severe uncontrolled pain   Complete by: As directed    Call MD for:  temperature >100.4   Complete by: As  directed    Diet - low sodium heart healthy   Complete by: As directed    Discharge instructions   Complete by: As directed    Ophelia Charter MD, MPH Noemi Chapel, PA-C [place]. [address], [address] [phone] (tel)   [phone] (fax)   POST-OPERATIVE INSTRUCTIONS - TOTAL SHOULDER REPLACEMENT   WOUND CARE - You may leave the operative dressing in place until your follow-up appointment. - [place]. - There may be a small amount of fluid/bleeding leaking at the surgical site. This is normal after surgery.  - If it fills with liquid or blood please call us immediately to change it for you. - Use the provided ice machine or Ice packs as often as possible for the first 3-4 days, then as needed for pain relief.  Keep a layer of cloth or a shirt between your skin and the cooling unit to prevent frost bite as it can get very cold.  SHOWERING: - You may shower on Post-Op Day #2.  - The dressing is water resistant but do not scrub it as it may start to peel up.   - You may remove the sling for showering, but keep a water resistant pillow under the arm to keep both the  elbow and shoulder away from the body (mimicking the abduction sling).  - Gently pat the area dry.  - Do not soak the shoulder in water. Do not go swimming in the pool or ocean until your sutures are removed. - KEEP THE INCISIONS CLEAN AND DRY.  EXERCISES - Wear the sling at all times except when doing your exercises.  -  You may remove the sling for showering, but keep the arm across the chest or in a secondary sling.    - Accidental/Purposeful External Rotation and shoulder flexion (reaching behind you) is to be avoided at all costs for the first month. - It is ok to come out of your sling if your are sitting and have assistance for eating.  Do not lift anything heavier than 1 pound until we discuss it further in clinic.  Please perform the exercises:   - Elbow / Hand  / Wrist  Range of Motion Exercises (Occupational therapy will teach you these exercises before your discharge home) - Grip strengthening using the ball attached to your sling  REGIONAL ANESTHESIA (NERVE BLOCKS) The anesthesia team may have performed a nerve block for you if safe in the setting of your care.  This is a great tool used to minimize pain.  Typically the block may start wearing off overnight but the long acting medicine may last for 3-4 days.  The nerve block wearing off can be a challenging period but please utilize your as needed pain medications to try and manage this period.    POST-OP MEDICATIONS- Multimodal approach to pain control In general your pain will be controlled with a combination of substances.  Prescriptions unless otherwise discussed are electronically sent to your pharmacy.  This is a carefully made plan we use to minimize narcotic use.    Meloxicam OR Celebrex - Anti-inflammatory medication taken on a scheduled basis Acetaminophen - Non-narcotic pain medicine taken on a scheduled basis  Oxycodone - This is a strong narcotic, to be used only on an "as needed" basis for pain. Omeprazole - daily medicine to protect your stomach while taking anti-inflammatories.   Zofran -  take as needed for nausea  Meloxicam/Celebrex - these are anti-inflammatory and pain relievers.  Do not take additional ibuprofen, naproxen or other NSAID while taking this medicine.   FOLLOW-UP If you develop a Fever (>101.5), Redness or Drainage from the surgical incision site, please call our office to arrange for an evaluation. Please call the office to schedule a follow-up appointment for a wound check, 7-10 days post-operatively.  IF YOU HAVE ANY QUESTIONS, PLEASE FEEL FREE TO CALL OUR OFFICE.  HELPFUL INFORMATION  If you had a block, it will wear off between 8-24 hrs postop typically.  This is period when your pain may go from nearly zero to the pain you would have had post-op without the  block.  This is an abrupt transition but nothing dangerous is happening.  You may take an extra dose of narcotic when this happens.  Your arm will be in a sling following surgery. You will be in this sling for the next 3-4 weeks.  I will let you know the exact duration at your follow-up visit.  You may be more comfortable sleeping in a semi-seated position the first few nights following surgery.  Keep a pillow propped under the elbow and forearm for comfort.  If you have a recliner type of chair it might be beneficial.  If not that is fine too, but it would be helpful to sleep propped up with pillows behind your operated shoulder as well under your elbow and forearm.  This will reduce pulling on the suture lines.  When dressing, put your operative arm in the sleeve first.  When getting undressed, take your operative arm out last.  Loose fitting, button-down shirts are recommended.  In most states it is against the law  to drive while your arm is in a sling. And certainly against the law to drive while taking narcotics.  You may return to work/school in the next couple of days when you feel up to it. Desk work and typing in the sling is fine.  We suggest you use the pain medication the first night prior to going to bed, in order to ease any pain when the anesthesia wears off. You should avoid taking pain medications on an empty stomach as it will make you nauseous.  Do not drink alcoholic beverages or take illicit drugs when taking pain medications.  Pain medication may make you constipated.  Below are a few solutions to try in this order: Decrease the amount of pain medication if you aren't having pain. Drink lots of decaffeinated fluids. Drink prune juice and/or each dried prunes  If the first 3 don't work start with additional solutions Take Colace - an over-the-counter stool softener Take Senokot - an over-the-counter laxative Take Miralax - a stronger over-the-counter laxative      -  UltraSling Information:  Detach shoulder strap buckles and open front panel.        Position elbow in the sling as far back as possible 2.   Place shoulder strap over opposite shoulder       Connect shoulder strap to the sling using the quick release buckles. 3.   Secure strap at the top. It should go over your forearm and attach to the other side.        The thumb strap attaches to the front of the sling. It goes between your thumb and index finger. 5.   The pillow should be at your waistline.        Your sling will Velcro to the pillow.        Buckle the waist strap to the pillow and adjust the strap as needed.   Here is a video you can watch:  [email or web address].[email or web address]    - Ice machine:  There are also additional videos on how to use your ice machine on youtube.  You may search "[employer] Machine", or "[employer] Shoulder Surgery"     Allergies as of 01/26/2020   No Known Allergies     Medication List    STOP taking these medications   HYDROcodone-acetaminophen 5-325 MG tablet Commonly known as: NORCO/VICODIN   ibuprofen 200 MG tablet Commonly known as: ADVIL     TAKE these medications   acetaminophen 500 MG tablet Commonly known as: TYLENOL Take 2 tablets (1,000 mg total) by mouth every 8 (eight) hours for 14 days.   aspirin EC 81 MG tablet Take 1 tablet (81 mg total) by mouth daily.   atorvastatin 20 MG tablet Commonly known as: LIPITOR Take 1 tablet (20 mg total) by mouth daily.   B-D UF III MINI PEN NEEDLES 31G X 5 MM Misc Generic drug: Insulin Pen Needle 1 Device by Does not apply route daily.   celecoxib 100 MG capsule Commonly known as: CELEBREX Take 1 capsule (100 mg total) by mouth 2 (two) times daily.   hydrochlorothiazide 25 MG tablet Commonly known as: HYDRODIURIL Take 1 tablet (25 mg total) by mouth daily. (Needs to be seen before next refill)   lisinopril 5 MG tablet Commonly known as: ZESTRIL TAKE 1 TABLET (5 MG  TOTAL) BY MOUTH DAILY. (NEEDS TO BE SEEN BEFORE NEXT REFILL) What changed: additional instructions   methocarbamol 500 MG tablet Commonly known as: Robaxin Take 1  tablet (500 mg total) by mouth every 8 (eight) hours as needed for muscle spasms.   omeprazole 20 MG capsule Commonly known as: PRILOSEC Take 1 capsule (20 mg total) by mouth daily. This is to protect your stomach while taking NSAIDs   ondansetron 4 MG tablet Commonly known as: Zofran Take 1 tablet (4 mg total) by mouth every 8 (eight) hours as needed for up to 7 days for nausea or vomiting.   oxyCODONE 5 MG immediate release tablet Commonly known as: Oxy IR/ROXICODONE Take 1-2 pills every 6 hrs as needed for pain, no more than 6 per day   Victoza 18 MG/3ML Sopn Generic drug: liraglutide Inject 0.3 mLs (1.8 mg total) into the skin at bedtime.

## 2020-01-26 NOTE — Plan of Care (Signed)
  Problem: Education: Goal: Knowledge of General Education information will improve Description: Including pain rating scale, medication(s)/side effects and non-pharmacologic comfort measures Outcome: Adequate for Discharge   Problem: Health Behavior/Discharge Planning: Goal: Ability to manage health-related needs will improve Outcome: Adequate for Discharge   Problem: Clinical Measurements: Goal: Ability to maintain clinical measurements within normal limits will improve Outcome: Adequate for Discharge Goal: Will remain free from infection Outcome: Adequate for Discharge Goal: Diagnostic test results will improve Outcome: Adequate for Discharge Goal: Respiratory complications will improve Outcome: Adequate for Discharge Goal: Cardiovascular complication will be avoided Outcome: Adequate for Discharge   Problem: Activity: Goal: Risk for activity intolerance will decrease Outcome: Adequate for Discharge   Problem: Nutrition: Goal: Adequate nutrition will be maintained Outcome: Adequate for Discharge   Problem: Coping: Goal: Level of anxiety will decrease Outcome: Adequate for Discharge   Problem: Elimination: Goal: Will not experience complications related to bowel motility Outcome: Adequate for Discharge Goal: Will not experience complications related to urinary retention Outcome: Adequate for Discharge   Problem: Pain Managment: Goal: General experience of comfort will improve Outcome: Adequate for Discharge   Problem: Safety: Goal: Ability to remain free from injury will improve Outcome: Adequate for Discharge   Problem: Skin Integrity: Goal: Risk for impaired skin integrity will decrease Outcome: Adequate for Discharge   Problem: Education: Goal: Knowledge of the prescribed therapeutic regimen will improve Outcome: Adequate for Discharge Goal: Understanding of activity limitations/precautions following surgery will improve Outcome: Adequate for  Discharge Goal: Individualized Educational Video(s) Outcome: Adequate for Discharge   Problem: Activity: Goal: Ability to tolerate increased activity will improve Outcome: Adequate for Discharge   Problem: Pain Management: Goal: Pain level will decrease with appropriate interventions Outcome: Adequate for Discharge   

## 2020-01-28 ENCOUNTER — Encounter: Payer: Self-pay | Admitting: *Deleted

## 2020-02-03 ENCOUNTER — Ambulatory Visit: Payer: BC Managed Care – PPO | Attending: Orthopaedic Surgery | Admitting: Physical Therapy

## 2020-02-03 ENCOUNTER — Encounter: Payer: Self-pay | Admitting: Physical Therapy

## 2020-02-03 ENCOUNTER — Other Ambulatory Visit: Payer: Self-pay

## 2020-02-03 DIAGNOSIS — M25611 Stiffness of right shoulder, not elsewhere classified: Secondary | ICD-10-CM | POA: Diagnosis present

## 2020-02-03 DIAGNOSIS — M25511 Pain in right shoulder: Secondary | ICD-10-CM | POA: Insufficient documentation

## 2020-02-03 NOTE — Therapy (Signed)
[facility] [place], [place], [address] Phone: [phone]   Fax:  [phone]  Physical Therapy Evaluation  Patient Details  Name: Brandy Hernandez MRN: [id number] Date of Birth: [birth year] Referring Provider (PT): Ophelia Charter, MD   Encounter Date: 02/03/2020  PT End of Session - 02/03/20 0919    Visit Number  1    Number of Visits  24    Date for PT Re-Evaluation  04/06/20    Authorization Type  FOTO (initial IE: 75% limitation); Progress note every 10th visit    PT Start Time  0815    PT Stop Time  0901    PT Time Calculation (min)  46 min    Equipment Utilized During Treatment  --   right sling   Activity Tolerance  Patient tolerated treatment well    Behavior During Therapy  WFL for tasks assessed/performed       Past Medical History:  Diagnosis Date  . Basal cell carcinoma   . COVID-19   . Diabetes mellitus without complication ([place])   . Hypertension     Past Surgical History:  Procedure Laterality Date  . ABDOMINAL HYSTERECTOMY    . CESAREAN SECTION    . COLONOSCOPY N/A 01/19/2019   Procedure: COLONOSCOPY;  Surgeon: Danie Binder, MD;  Location: [facility];  Service: Endoscopy;  Laterality: N/A;  9:30  . ENDOMETRIAL ABLATION    . POLYPECTOMY  01/19/2019   Procedure: POLYPECTOMY;  Surgeon: Danie Binder, MD;  Location: [facility];  Service: Endoscopy;;  transverse colon   . REVERSE SHOULDER ARTHROPLASTY Right 01/25/2020   Procedure: TOTAL SHOULDER ARTHROPLASTY;  Surgeon: Hiram Gash, MD;  Location: [facility];  Service: Orthopedics;  Laterality: Right;  . SKIN CANCER EXCISION  2018    There were no vitals filed for this visit.   Subjective Assessment - 02/03/20 0916    Subjective  COVID-19 screening performed upon arrival.Patient arrives to physical therapy with reports of right shoulder pain and difficulty perform ADLs due to a right total shoulder replacement on 01/25/2020. Patient reports ADLs  require increased time to perform and has her son, family, and friends to assist with home activities. Patient is compliant with HEP provided by MD. Patient reports pain at worst as 9/10 with the wrong movement and pain at best as 0/10 with pain medication. Patient's goals are to decrease pain, improve movement, improve strength, and return to performing all activities.    Pertinent History  R TSA, 01/25/2020, HTN, DM    Limitations  Lifting;House hold activities    Diagnostic tests  x-ray    Patient Stated Goals  "return to all activities"    Currently in Pain?  Yes    Pain Score  4     Pain Location  Shoulder    Pain Orientation  Right    Pain Descriptors / Indicators  Aching;Sore    Pain Type  Surgical pain    Pain Onset  1 to 4 weeks ago    Pain Frequency  Constant    Aggravating Factors   movement    Pain Relieving Factors  rest, ice, pain medication    Effect of Pain on Daily Activities  increased time to perform ADLs         [facility] PT Assessment - 02/03/20 0001      Assessment   Medical Diagnosis  Right anatomic stemless total shoulder arthroplasty    Referring Provider (PT)  Ophelia Charter,  MD    Onset Date/Surgical Date  01/25/20    Hand Dominance  Right    Next MD Visit  02/23/2020    Prior Therapy  no      Precautions   Precautions  Shoulder    Type of Shoulder Precautions  Follow Dr. Rich Fuchs TSA protocol      Restrictions   Weight Bearing Restrictions  Yes    RUE Weight Bearing  Non weight bearing      Balance Screen   Has the patient fallen in the past 6 months  Yes    How many times?  1    Has the patient had a decrease in activity level because of a fear of falling?   No    Is the patient reluctant to leave their home because of a fear of falling?   No      Home Environment   Living Environment  Private residence    Living Arrangements  Children      Prior Function   Level of Independence  Needs assistance with homemaking;Independent with basic ADLs       Observation/Other Assessments   Focus on Therapeutic Outcomes (FOTO)   75% limitation      ROM / Strength   AROM / PROM / Strength  PROM      PROM   PROM Assessment Site  Shoulder    Right/Left Shoulder  Right    Right Shoulder Flexion  56 Degrees    Right Shoulder ABduction  64 Degrees    Right Shoulder External Rotation  20 Degrees      Palpation   Palpation comment  minimal pain upon palpation to right shoulder, increased scar tissue at inferior aspect      Transfers   Comments  slow transfers with supervision                Objective measurements completed on examination: See above findings.      [place] Adult PT Treatment/Exercise - 02/03/20 0001      Modalities   Modalities  Vasopneumatic      Vasopneumatic   Number Minutes Vasopneumatic   10 minutes    Vasopnuematic Location   Shoulder    Vasopneumatic Pressure  Low    Vasopneumatic Temperature   48             PT Education - 02/03/20 0918    Education Details  pendulums, lateral and fwd/bwd, putty grip    Person(s) Educated  Patient    Methods  Explanation;Demonstration;Handout    Comprehension  Verbalized understanding          PT Long Term Goals - 02/03/20 [id number]      PT LONG TERM GOAL #1   Title  Patient will be independent with HEP    Time  8    Period  Weeks    Status  New      PT LONG TERM GOAL #2   Title  Patient will report ability to perform ADLs with right shoulder pain less than or equal to 3/10.    Time  8    Period  Weeks    Status  New      PT LONG TERM GOAL #3   Title  Patient will demonstrate 55+ degrees of right shoulder ER AROM to improve donning and doffing apparel.    Time  8    Period  Weeks    Status  New      PT  LONG TERM GOAL #4   Title  Patient will demonstrate 140+ degrees of right shoulder flexion AROM to improve overhead activities.    Time  8    Period  Weeks    Status  New      PT LONG TERM GOAL #5   Title  Patient will demonstrate 4/5 or  greater of right shoulder MMT in all planes to improve stability during functional tasks.    Time  8    Period  Weeks    Status  New             Plan - 02/03/20 0935    Clinical Impression Statement  Patient is a 54 year old right handed female who presents to physical therapy with decreased right shoulder PROM and right shoulder pain secondary to a right TSA on 01/25/2020. Patient denied tenderness to palpation to right shoulder, but notable scar tissue palpated along inferior aspect of the scar. Patient can recite precautions and is independent with donning and doffing apparel. Patient and PT discussed plan of care, protocol for TSA, and HEP to which patient reported understanding. Patient would benefit from skilled physical therapy to address deficits and address patient's goals.    Personal Factors and Comorbidities  Comorbidity 2    Comorbidities  right TSA 01/25/2020, HTN, DM    Examination-Activity Limitations  Dressing;Hygiene/Grooming;Toileting;Reach Overhead;Carry;Lift    Stability/Clinical Decision Making  Stable/Uncomplicated    Clinical Decision Making  Low    Rehab Potential  Good    PT Frequency  3x / week    PT Duration  8 weeks    PT Treatment/Interventions  ADLs/Self Care Home Management;Cryotherapy;Electrical Stimulation;Moist Heat;Iontophoresis 4mg /ml Dexamethasone;Neuromuscular re-education;Manual techniques;Passive range of motion;Therapeutic exercise;Therapeutic activities;Functional mobility training;Patient/family education;Vasopneumatic Device    PT Next Visit Plan  PROM to right shoulder, PROM goals by week 2: 120 flexion, 30 deg ER, 75 ABD without rotation; modalities PRN for pain relief.    PT Home Exercise Plan  see patient education section    Consulted and Agree with Plan of Care  Patient       Patient will benefit from skilled therapeutic intervention in order to improve the following deficits and impairments:  Decreased activity tolerance, Decreased range  of motion, Decreased strength, Postural dysfunction, Pain, Impaired UE functional use, Decreased scar mobility  Visit Diagnosis: Acute pain of right shoulder - Plan: PT plan of care cert/re-cert  Stiffness of right shoulder, not elsewhere classified - Plan: PT plan of care cert/re-cert     Problem List Patient Active Problem List   Diagnosis Date Noted  . Glenoid fracture of shoulder 01/25/2020  . Diabetes mellitus ([place]) 12/09/2019  . COVID-19 12/01/2019  . H/O: hysterectomy 10/23/2018  . History of C-section 10/23/2018  . T2DM (type 2 diabetes mellitus) ([place]) 09/20/2017  . Morbid obesity with BMI of 40.0-44.9, adult ([place]) 02/13/2017  . Hypertension associated with type 2 diabetes mellitus ([place]) 02/14/2015    Gabriela Eves, PT, DPT 02/03/2020, 9:54 AM  [facility] [address] [place], [place], [address] Phone: [phone]   Fax:  [phone]  Name: Brandy Hernandez MRN: [id number] Date of Birth: 1966/10/11

## 2020-02-04 ENCOUNTER — Encounter: Payer: Self-pay | Admitting: Physical Therapy

## 2020-02-04 ENCOUNTER — Other Ambulatory Visit: Payer: Self-pay

## 2020-02-04 ENCOUNTER — Ambulatory Visit: Payer: BC Managed Care – PPO | Attending: Orthopaedic Surgery | Admitting: Physical Therapy

## 2020-02-04 DIAGNOSIS — M25511 Pain in right shoulder: Secondary | ICD-10-CM

## 2020-02-04 DIAGNOSIS — M25611 Stiffness of right shoulder, not elsewhere classified: Secondary | ICD-10-CM

## 2020-02-04 NOTE — Therapy (Signed)
[facility] [place], [place], [address] Phone: [phone]   Fax:  [phone]  Physical Therapy Treatment  Patient Details  Name: Brandy Hernandez MRN: [id number] Date of Birth: [birth year] Referring Provider (PT): Ophelia Charter, MD   Encounter Date: 02/04/2020  PT End of Session - 02/04/20 0833    Visit Number  2    Number of Visits  24    Date for PT Re-Evaluation  04/06/20    Authorization Type  FOTO (initial IE: 75% limitation); Progress note every 10th visit    PT Start Time  0815    PT Stop Time  0858    PT Time Calculation (min)  43 min    Activity Tolerance  Patient tolerated treatment well    Behavior During Therapy  [facility] for tasks assessed/performed       Past Medical History:  Diagnosis Date  . Basal cell carcinoma   . COVID-19   . Diabetes mellitus without complication ([place])   . Hypertension     Past Surgical History:  Procedure Laterality Date  . ABDOMINAL HYSTERECTOMY    . CESAREAN SECTION    . COLONOSCOPY N/A 01/19/2019   Procedure: COLONOSCOPY;  Surgeon: Danie Binder, MD;  [facility];  Service: Endoscopy;  Laterality: N/A;  9:30  . ENDOMETRIAL ABLATION    . POLYPECTOMY  01/19/2019   Procedure: POLYPECTOMY;  Surgeon: Danie Binder, MD;  [facility];  Service: Endoscopy;;  transverse colon   . REVERSE SHOULDER ARTHROPLASTY Right 01/25/2020   Procedure: TOTAL SHOULDER ARTHROPLASTY;  Surgeon: Hiram Gash, MD;  Location: [facility];  Service: Orthopedics;  Laterality: Right;  . SKIN CANCER EXCISION  2018    There were no vitals filed for this visit.  Subjective Assessment - 02/04/20 0820    Subjective  COVID-19 screening performed upon arrival. Patient reported soreness today    Pertinent History  R TSA, 01/25/2020, HTN, DM    Limitations  Lifting;House hold activities    Diagnostic tests  x-ray    Patient Stated Goals  "return to all activities"    Currently in Pain?  Yes     Pain Score  3     Pain Location  Shoulder    Pain Orientation  Right    Pain Descriptors / Indicators  Aching;Sore    Pain Type  Surgical pain    Pain Onset  1 to 4 weeks ago    Pain Frequency  Constant    Aggravating Factors   movement    Pain Relieving Factors  rest                       [facility] Adult PT Treatment/Exercise - 02/04/20 0001      Modalities   Modalities  Electrical Stimulation;Vasopneumatic      Electrical Stimulation   Electrical Stimulation Location  right shoulder    Electrical Stimulation Action  premod non motoric    Electrical Stimulation Parameters  1-10hz  x45min    Electrical Stimulation Goals  Pain      Vasopneumatic   Number Minutes Vasopneumatic   10 minutes    Vasopnuematic Location   Shoulder    Vasopneumatic Pressure  Low    Vasopneumatic Temperature   34 for edema      Manual Therapy   Manual Therapy  Passive ROM    Passive ROM  manual PROM for flexion/ER within protocol limitations  PT Long Term Goals - 02/03/20 [id number]      PT LONG TERM GOAL #1   Title  Patient will be independent with HEP    Time  8    Period  Weeks    Status  New      PT LONG TERM GOAL #2   Title  Patient will report ability to perform ADLs with right shoulder pain less than or equal to 3/10.    Time  8    Period  Weeks    Status  New      PT LONG TERM GOAL #3   Title  Patient will demonstrate 55+ degrees of right shoulder ER AROM to improve donning and doffing apparel.    Time  8    Period  Weeks    Status  New      PT LONG TERM GOAL #4   Title  Patient will demonstrate 140+ degrees of right shoulder flexion AROM to improve overhead activities.    Time  8    Period  Weeks    Status  New      PT LONG TERM GOAL #5   Title  Patient will demonstrate 4/5 or greater of right shoulder MMT in all planes to improve stability during functional tasks.    Time  8    Period  Weeks    Status  New            Plan -  02/04/20 [id number]    Clinical Impression Statement  Patient tolerated treatment well today. Patient was educated on current protocol and healing process. Today focused on PROM withing protocal limitations. Patient responded well to modalities today. Patient current goals ongoing.    Personal Factors and Comorbidities  Comorbidity 2    Comorbidities  right TSA 01/25/2020, HTN, DM    Examination-Activity Limitations  Dressing;Hygiene/Grooming;Toileting;Reach Overhead;Carry;Lift    Stability/Clinical Decision Making  Stable/Uncomplicated    Rehab Potential  Good    PT Frequency  3x / week    PT Duration  8 weeks    PT Treatment/Interventions  ADLs/Self Care Home Management;Cryotherapy;Electrical Stimulation;Moist Heat;Iontophoresis 4mg /ml Dexamethasone;Neuromuscular re-education;Manual techniques;Passive range of motion;Therapeutic exercise;Therapeutic activities;Functional mobility training;Patient/family education;Vasopneumatic Device    PT Next Visit Plan  PROM to right shoulder, PROM goals by week 2: 120 flexion, 30 deg ER, 75 ABD without rotation; modalities PRN for pain relief.    Consulted and Agree with Plan of Care  Patient       Patient will benefit from skilled therapeutic intervention in order to improve the following deficits and impairments:  Decreased activity tolerance, Decreased range of motion, Decreased strength, Postural dysfunction, Pain, Impaired UE functional use, Decreased scar mobility  Visit Diagnosis: Acute pain of right shoulder  Stiffness of right shoulder, not elsewhere classified     Problem List Patient Active Problem List   Diagnosis Date Noted  . Glenoid fracture of shoulder 01/25/2020  . Diabetes mellitus ([place]) 12/09/2019  . COVID-19 12/01/2019  . H/O: hysterectomy 10/23/2018  . History of C-section 10/23/2018  . T2DM (type 2 diabetes mellitus) ([place]) 09/20/2017  . Morbid obesity with BMI of 40.0-44.9, adult ([place]) 02/13/2017  . Hypertension associated with  type 2 diabetes mellitus ([place]) 02/14/2015    Lollie Gunner P, PTA 02/04/2020, 9:02 AM  [facility] [place], [place], [address] Phone: [phone]   Fax:  [phone]  Name: Brandy Hernandez MRN: [id number] Date of Birth: 22-Nov-1965

## 2020-02-08 ENCOUNTER — Other Ambulatory Visit: Payer: Self-pay

## 2020-02-08 ENCOUNTER — Ambulatory Visit: Payer: BC Managed Care – PPO | Admitting: Physical Therapy

## 2020-02-08 DIAGNOSIS — M25611 Stiffness of right shoulder, not elsewhere classified: Secondary | ICD-10-CM

## 2020-02-08 DIAGNOSIS — M25511 Pain in right shoulder: Secondary | ICD-10-CM | POA: Diagnosis not present

## 2020-02-08 NOTE — Therapy (Signed)
[facility] [place], [place], [address] Phone: [phone]   Fax:  [phone]  Physical Therapy Treatment  Patient Details  Name: Brandy Hernandez MRN: [id number] Date of Birth: 01/03/1966 Referring Provider (PT): Ophelia Charter, MD   Encounter Date: 02/08/2020  PT End of Session - 02/08/20 1048    Visit Number  3    Number of Visits  24    Date for PT Re-Evaluation  04/06/20    Authorization Type  FOTO (initial IE: 75% limitation); Progress note every 10th visit    PT Start Time  0900    PT Stop Time  0955    PT Time Calculation (min)  55 min    Activity Tolerance  Patient tolerated treatment well    Behavior During Therapy  [facility] for tasks assessed/performed       Past Medical History:  Diagnosis Date  . Basal cell carcinoma   . COVID-19   . Diabetes mellitus without complication ([place])   . Hypertension     Past Surgical History:  Procedure Laterality Date  . ABDOMINAL HYSTERECTOMY    . CESAREAN SECTION    . COLONOSCOPY N/A 01/19/2019   Procedure: COLONOSCOPY;  Surgeon: Danie Binder, MD;  [facility];  Service: Endoscopy;  Laterality: N/A;  9:30  . ENDOMETRIAL ABLATION    . POLYPECTOMY  01/19/2019   Procedure: POLYPECTOMY;  Surgeon: Danie Binder, MD;  [facility];  Service: Endoscopy;;  transverse colon   . REVERSE SHOULDER ARTHROPLASTY Right 01/25/2020   Procedure: TOTAL SHOULDER ARTHROPLASTY;  Surgeon: Hiram Gash, MD;  [facility];  Service: Orthopedics;  Laterality: Right;  . SKIN CANCER EXCISION  2018    There were no vitals filed for this visit.  Subjective Assessment - 02/08/20 1049    Subjective  COVID-19 screen performed prior to patient entering clinic.  No new complaints.    Pertinent History  R TSA, 01/25/2020, HTN, DM    Limitations  Lifting;House hold activities    Diagnostic tests  x-ray    Patient Stated Goals  "return to all activities"    Currently in Pain?   Yes    Pain Score  3     Pain Location  Shoulder    Pain Orientation  Right    Pain Onset  1 to 4 weeks ago                       [facility] Adult PT Treatment/Exercise - 02/08/20 0001      Modalities   Modalities  Electrical Stimulation      Electrical Stimulation   Electrical Stimulation Location  RT shoulder.    Electrical Stimulation Action  Pre-mod (non-motoric).    Electrical Stimulation Parameters  80-150 Hz x 20 minutes.    Electrical Stimulation Goals  Pain      Vasopneumatic   Number Minutes Vasopneumatic   20 minutes    Vasopnuematic Location   --   RT shoulder.  PIllow btw thorax and elbow.   Vasopneumatic Pressure  Low      Manual Therapy   Manual Therapy  Passive ROM    Passive ROM  In supine:  Gentle passive right shoulder range of motion per protocol x 24 minutes.             PT Education - 02/08/20 1055    Education Details  Supine passive cane exercise to increase ER.  Person(s) Educated  Patient    Methods  Explanation;Demonstration;Tactile cues;Handout    Comprehension  Verbalized understanding;Returned demonstration;Need further instruction          PT Long Term Goals - 02/03/20 [id number]      PT LONG TERM GOAL #1   Title  Patient will be independent with HEP    Time  8    Period  Weeks    Status  New      PT LONG TERM GOAL #2   Title  Patient will report ability to perform ADLs with right shoulder pain less than or equal to 3/10.    Time  8    Period  Weeks    Status  New      PT LONG TERM GOAL #3   Title  Patient will demonstrate 55+ degrees of right shoulder ER AROM to improve donning and doffing apparel.    Time  8    Period  Weeks    Status  New      PT LONG TERM GOAL #4   Title  Patient will demonstrate 140+ degrees of right shoulder flexion AROM to improve overhead activities.    Time  8    Period  Weeks    Status  New      PT LONG TERM GOAL #5   Title  Patient will demonstrate 4/5 or greater of right  shoulder MMT in all planes to improve stability during functional tasks.    Time  8    Period  Weeks    Status  New            Plan - 02/08/20 1055    Clinical Impression Statement  Patient did very well today with passive right shoulder range of motion per protocol.  Added passive cane exercise performed in supine to increase ER.    Personal Factors and Comorbidities  Comorbidity 2    Comorbidities  right TSA 01/25/2020, HTN, DM    Examination-Activity Limitations  Dressing;Hygiene/Grooming;Toileting;Reach Overhead;Carry;Lift    Stability/Clinical Decision Making  Stable/Uncomplicated    Rehab Potential  Good    PT Frequency  3x / week    PT Duration  8 weeks    PT Treatment/Interventions  ADLs/Self Care Home Management;Cryotherapy;Electrical Stimulation;Moist Heat;Iontophoresis 4mg /ml Dexamethasone;Neuromuscular re-education;Manual techniques;Passive range of motion;Therapeutic exercise;Therapeutic activities;Functional mobility training;Patient/family education;Vasopneumatic Device    PT Home Exercise Plan  see patient education section       Patient will benefit from skilled therapeutic intervention in order to improve the following deficits and impairments:  Decreased activity tolerance, Decreased range of motion, Decreased strength, Postural dysfunction, Pain, Impaired UE functional use, Decreased scar mobility  Visit Diagnosis: Acute pain of right shoulder  Stiffness of right shoulder, not elsewhere classified     Problem List Patient Active Problem List   Diagnosis Date Noted  . Glenoid fracture of shoulder 01/25/2020  . Diabetes mellitus ([place]) 12/09/2019  . COVID-19 12/01/2019  . H/O: hysterectomy 10/23/2018  . History of C-section 10/23/2018  . T2DM (type 2 diabetes mellitus) ([place]) 09/20/2017  . Morbid obesity with BMI of 40.0-44.9, adult ([place]) 02/13/2017  . Hypertension associated with type 2 diabetes mellitus ([place]) 02/14/2015    Kyion Gautier, [place]  MPT 02/08/2020, 10:57 AM  [facility] [address] [place], [place], [address] Phone: [phone]   Fax:  [phone]  Name: YOUSRA WEBBER MRN: [id number] Date of Birth: 11-16-1965

## 2020-02-10 ENCOUNTER — Encounter: Payer: Self-pay | Admitting: Physical Therapy

## 2020-02-10 ENCOUNTER — Other Ambulatory Visit: Payer: Self-pay

## 2020-02-10 ENCOUNTER — Ambulatory Visit: Payer: BC Managed Care – PPO | Admitting: Physical Therapy

## 2020-02-10 DIAGNOSIS — M25611 Stiffness of right shoulder, not elsewhere classified: Secondary | ICD-10-CM

## 2020-02-10 DIAGNOSIS — M25511 Pain in right shoulder: Secondary | ICD-10-CM

## 2020-02-10 NOTE — Therapy (Signed)
[facility] [place], [place], [address] Phone: [phone]   Fax:  [phone]  Physical Therapy Treatment  Patient Details  Name: Brandy Hernandez MRN: [id number] Date of Birth: 03-Oct-1966 Referring Provider (PT): Ophelia Charter, MD   Encounter Date: 02/10/2020  PT End of Session - 02/10/20 0745    Visit Number  4    Number of Visits  24    Date for PT Re-Evaluation  04/06/20    Authorization Type  FOTO (initial IE: 75% limitation); Progress note every 10th visit    PT Start Time  0730    PT Stop Time  0812    PT Time Calculation (min)  42 min    Activity Tolerance  Patient tolerated treatment well    Behavior During Therapy  [facility] for tasks assessed/performed       Past Medical History:  Diagnosis Date  . Basal cell carcinoma   . COVID-19   . Diabetes mellitus without complication ([place])   . Hypertension     Past Surgical History:  Procedure Laterality Date  . ABDOMINAL HYSTERECTOMY    . CESAREAN SECTION    . COLONOSCOPY N/A 01/19/2019   Procedure: COLONOSCOPY;  Surgeon: Danie Binder, MD;  [facility];  Service: Endoscopy;  Laterality: N/A;  9:30  . ENDOMETRIAL ABLATION    . POLYPECTOMY  01/19/2019   Procedure: POLYPECTOMY;  Surgeon: Danie Binder, MD;  [facility];  Service: Endoscopy;;  transverse colon   . REVERSE SHOULDER ARTHROPLASTY Right 01/25/2020   Procedure: TOTAL SHOULDER ARTHROPLASTY;  Surgeon: Hiram Gash, MD;  [facility];  Service: Orthopedics;  Laterality: Right;  . SKIN CANCER EXCISION  2018    There were no vitals filed for this visit.  Subjective Assessment - 02/10/20 0732    Subjective  COVID-19 screen performed prior to patient entering clinic.  Pain today and soreness after last visit    Pertinent History  R TSA, 01/25/2020, HTN, DM    Limitations  Lifting;House hold activities    Diagnostic tests  x-ray    Patient Stated Goals  "return to all activities"    Pain Score  4     Pain Location  Shoulder    Pain Orientation  Right    Pain Descriptors / Indicators  Aching    Pain Type  Surgical pain    Pain Onset  1 to 4 weeks ago    Pain Frequency  Constant    Aggravating Factors   movement    Pain Relieving Factors  rest         [facility] PT Assessment - 02/10/20 0001      PROM   PROM Assessment Site  Shoulder    Right Shoulder Flexion  75 Degrees    Right Shoulder External Rotation  28 Degrees                   [facility] Adult PT Treatment/Exercise - 02/10/20 0001      Electrical Stimulation   Electrical Stimulation Location  RT shoulder.    Child psychotherapist Parameters  80-150hz  x75min    Electrical Stimulation Goals  Pain      Vasopneumatic   Number Minutes Vasopneumatic   15 minutes    Vasopnuematic Location   Shoulder    Vasopneumatic Pressure  Low    Vasopneumatic Temperature   34 for edema  Manual Therapy   Manual Therapy  Passive ROM    Passive ROM  manual PROM for right shoulder flexion, ER, abd within protocol limits                  PT Long Term Goals - 02/10/20 0746      PT LONG TERM GOAL #1   Title  Patient will be independent with HEP    Time  8    Period  Weeks    Status  On-going      PT LONG TERM GOAL #2   Title  Patient will report ability to perform ADLs with right shoulder pain less than or equal to 3/10.    Time  8    Period  Weeks    Status  On-going   not able to perfrom ADL's with UE at this time 02/10/20     PT LONG TERM GOAL #3   Title  Patient will demonstrate 55+ degrees of right shoulder ER AROM to improve donning and doffing apparel.    Time  8    Period  Weeks    Status  On-going      PT LONG TERM GOAL #4   Title  Patient will demonstrate 140+ degrees of right shoulder flexion AROM to improve overhead activities.    Time  8    Period  Weeks    Status  On-going      PT LONG TERM GOAL #5   Title  Patient will  demonstrate 4/5 or greater of right shoulder MMT in all planes to improve stability during functional tasks.    Time  8    Period  Weeks    Status  On-going            Plan - 02/10/20 0805    Clinical Impression Statement  Patient tolerated treatment fair today due to increased soreness. Patient has overall improved with ROM in right shoulder for flexion and ER within protocol limitations. Patient current goals progressing due to healing and protocol.    Personal Factors and Comorbidities  Comorbidity 2    Comorbidities  right TSA 01/25/2020, HTN, DM    Examination-Activity Limitations  Dressing;Hygiene/Grooming;Toileting;Reach Overhead;Carry;Lift    Stability/Clinical Decision Making  Stable/Uncomplicated    Rehab Potential  Good    PT Frequency  3x / Hernandez    PT Duration  8 weeks    PT Treatment/Interventions  ADLs/Self Care Home Management;Cryotherapy;Electrical Stimulation;Moist Heat;Iontophoresis 4mg /ml Dexamethasone;Neuromuscular re-education;Manual techniques;Passive range of motion;Therapeutic exercise;Therapeutic activities;Functional mobility training;Patient/family education;Vasopneumatic Device    PT Next Visit Plan  PROM to right shoulder, PROM goals by Hernandez 2: 120 flexion, 30 deg ER, 75 ABD without rotation; modalities PRN for pain relief.    Consulted and Agree with Plan of Care  Patient       Patient will benefit from skilled therapeutic intervention in order to improve the following deficits and impairments:  Decreased activity tolerance, Decreased range of motion, Decreased strength, Postural dysfunction, Pain, Impaired UE functional use, Decreased scar mobility  Visit Diagnosis: Acute pain of right shoulder  Stiffness of right shoulder, not elsewhere classified     Problem List Patient Active Problem List   Diagnosis Date Noted  . Glenoid fracture of shoulder 01/25/2020  . Diabetes mellitus ([place]) 12/09/2019  . COVID-19 12/01/2019  . H/O: hysterectomy  10/23/2018  . History of C-section 10/23/2018  . T2DM (type 2 diabetes mellitus) ([place]) 09/20/2017  . Morbid obesity with BMI of 40.0-44.9, adult ([place])  02/13/2017  . Hypertension associated with type 2 diabetes mellitus ([place]) 02/14/2015    Jessamyn Watterson P, PTA 02/10/2020, 8:23 AM  [facility] [place], [place], [address] Phone: [phone]   Fax:  [phone]  Name: Brandy Hernandez MRN: [id number] Date of Birth: 03-23-1966

## 2020-02-12 ENCOUNTER — Encounter: Payer: Self-pay | Admitting: Physical Therapy

## 2020-02-12 ENCOUNTER — Ambulatory Visit: Payer: BC Managed Care – PPO | Admitting: Physical Therapy

## 2020-02-12 ENCOUNTER — Other Ambulatory Visit: Payer: Self-pay

## 2020-02-12 DIAGNOSIS — M25511 Pain in right shoulder: Secondary | ICD-10-CM

## 2020-02-12 DIAGNOSIS — M25611 Stiffness of right shoulder, not elsewhere classified: Secondary | ICD-10-CM

## 2020-02-12 NOTE — Therapy (Signed)
[facility] [place], [place], [address] Phone: [phone]   Fax:  [phone]  Physical Therapy Treatment  Patient Details  Name: KIABETH MCANANY MRN: [id number] Date of Birth: [birth year] Referring Provider (PT): Ophelia Charter, MD   Encounter Date: 02/12/2020  PT End of Session - 02/12/20 0935    Visit Number  5    Number of Visits  24    Date for PT Re-Evaluation  04/06/20    Authorization Type  FOTO (initial IE: 75% limitation); Progress note every 10th visit    PT Start Time  0815    PT Stop Time  0906    PT Time Calculation (min)  51 min    Activity Tolerance  Patient tolerated treatment well    Behavior During Therapy  [facility] for tasks assessed/performed       Past Medical History:  Diagnosis Date  . Basal cell carcinoma   . COVID-19   . Diabetes mellitus without complication ([place])   . Hypertension     Past Surgical History:  Procedure Laterality Date  . ABDOMINAL HYSTERECTOMY    . CESAREAN SECTION    . COLONOSCOPY N/A 01/19/2019   Procedure: COLONOSCOPY;  Surgeon: Danie Binder, MD;  [facility];  Service: Endoscopy;  Laterality: N/A;  9:30  . ENDOMETRIAL ABLATION    . POLYPECTOMY  01/19/2019   Procedure: POLYPECTOMY;  Surgeon: Danie Binder, MD;  [facility];  Service: Endoscopy;;  transverse colon   . REVERSE SHOULDER ARTHROPLASTY Right 01/25/2020   Procedure: TOTAL SHOULDER ARTHROPLASTY;  Surgeon: Hiram Gash, MD;  [facility]: [facility];  Service: Orthopedics;  Laterality: Right;  . SKIN CANCER EXCISION  2018    There were no vitals filed for this visit.  Subjective Assessment - 02/12/20 0935    Subjective  COVID-19 screen performed prior to patient entering clinic.  Patient arrives doing well but with ongoing soreness.    Pertinent History  R TSA, 01/25/2020, HTN, DM    Limitations  Lifting;House hold activities    Diagnostic tests  x-ray    Patient Stated Goals  "return to all  activities"    Currently in Pain?  Yes    Pain Score  4     Pain Location  Shoulder    Pain Orientation  Right    Pain Descriptors / Indicators  Aching    Pain Type  Surgical pain    Pain Onset  1 to 4 weeks ago    Pain Frequency  Constant         [facility] PT Assessment - 02/12/20 0001      Assessment   Medical Diagnosis  Right anatomic stemless total shoulder arthroplasty    Referring Provider (PT)  Ophelia Charter, MD    Onset Date/Surgical Date  01/25/20    Hand Dominance  Right    Next MD Visit  02/23/2020    Prior Therapy  no      Precautions   Precautions  Shoulder    Type of Shoulder Precautions  Follow Dr. Rich Fuchs TSA protocol                   [facility] Adult PT Treatment/Exercise - 02/12/20 0001      Modalities   Modalities  Electrical Stimulation      Electrical Stimulation   Electrical Stimulation Location  right shoulder    Electrical Stimulation Action  IFC    Electrical Stimulation Parameters  80-150 hz x15 mins    Electrical Stimulation Goals  Pain      Vasopneumatic   Number Minutes Vasopneumatic   15 minutes    Vasopnuematic Location   Shoulder    Vasopneumatic Pressure  Low    Vasopneumatic Temperature   34      Manual Therapy   Manual Therapy  Passive ROM    Passive ROM  PROM to right shoulder flexion, ER, abd within protocol limits                  PT Long Term Goals - 02/10/20 0746      PT LONG TERM GOAL #1   Title  Patient will be independent with HEP    Time  8    Period  Weeks    Status  On-going      PT LONG TERM GOAL #2   Title  Patient will report ability to perform ADLs with right shoulder pain less than or equal to 3/10.    Time  8    Period  Weeks    Status  On-going   not able to perfrom ADL's with UE at this time 02/10/20     PT LONG TERM GOAL #3   Title  Patient will demonstrate 55+ degrees of right shoulder ER AROM to improve donning and doffing apparel.    Time  8    Period  Weeks    Status  On-going       PT LONG TERM GOAL #4   Title  Patient will demonstrate 140+ degrees of right shoulder flexion AROM to improve overhead activities.    Time  8    Period  Weeks    Status  On-going      PT LONG TERM GOAL #5   Title  Patient will demonstrate 4/5 or greater of right shoulder MMT in all planes to improve stability during functional tasks.    Time  8    Period  Weeks    Status  On-going            Plan - 02/12/20 0936    Clinical Impression Statement  Patient responded well to therapy session with slight increase of soreness during PROM. Patient noted with smooth arcs of motion in flexion, ER and abduction within protocol. No adverse affects upon removal of modalities.    Personal Factors and Comorbidities  Comorbidity 2    Comorbidities  right TSA 01/25/2020, HTN, DM    Examination-Activity Limitations  Dressing;Hygiene/Grooming;Toileting;Reach Overhead;Carry;Lift    Stability/Clinical Decision Making  Stable/Uncomplicated    Clinical Decision Making  Low    Rehab Potential  Good    PT Frequency  3x / week    PT Duration  8 weeks    PT Treatment/Interventions  ADLs/Self Care Home Management;Cryotherapy;Electrical Stimulation;Moist Heat;Iontophoresis 4mg /ml Dexamethasone;Neuromuscular re-education;Manual techniques;Passive range of motion;Therapeutic exercise;Therapeutic activities;Functional mobility training;Patient/family education;Vasopneumatic Device    PT Next Visit Plan  PROM to right shoulder, PROM goals by week 2 (02/08/2020): 120 flexion, 30 deg ER, 75 ABD without rotation; modalities PRN for pain relief.    Consulted and Agree with Plan of Care  Patient       Patient will benefit from skilled therapeutic intervention in order to improve the following deficits and impairments:  Decreased activity tolerance, Decreased range of motion, Decreased strength, Postural dysfunction, Pain, Impaired UE functional use, Decreased scar mobility  Visit Diagnosis: Acute pain of right  shoulder  Stiffness of right shoulder, not elsewhere  classified     Problem List Patient Active Problem List   Diagnosis Date Noted  . Glenoid fracture of shoulder 01/25/2020  . Diabetes mellitus ([place]) 12/09/2019  . COVID-19 12/01/2019  . H/O: hysterectomy 10/23/2018  . History of C-section 10/23/2018  . T2DM (type 2 diabetes mellitus) ([place]) 09/20/2017  . Morbid obesity with BMI of 40.0-44.9, adult ([place]) 02/13/2017  . Hypertension associated with type 2 diabetes mellitus ([place]) 02/14/2015    Gabriela Eves, PT, DPT 02/12/2020, 9:40 AM  [facility] [address] [place], [place], [address] Phone: [phone]   Fax:  [phone]  Name: JUNA WIER MRN: [id number] Date of Birth: 08/21/1966

## 2020-02-15 ENCOUNTER — Ambulatory Visit: Payer: BC Managed Care – PPO | Admitting: Physical Therapy

## 2020-02-15 ENCOUNTER — Other Ambulatory Visit: Payer: Self-pay

## 2020-02-15 DIAGNOSIS — M25611 Stiffness of right shoulder, not elsewhere classified: Secondary | ICD-10-CM

## 2020-02-15 DIAGNOSIS — M25511 Pain in right shoulder: Secondary | ICD-10-CM | POA: Diagnosis not present

## 2020-02-15 NOTE — Therapy (Signed)
[facility] [place], [place], [address] Phone: [phone]   Fax:  [phone]  Physical Therapy Treatment  Patient Details  Name: Brandy Hernandez MRN: [id number] Date of Birth: 1965-12-22 Referring Provider (PT): Ophelia Charter, MD   Encounter Date: 02/15/2020  PT End of Session - 02/15/20 0826    Visit Number  6    Number of Visits  24    Date for PT Re-Evaluation  04/06/20    Authorization Type  FOTO (initial IE: 75% limitation); Progress note every 10th visit    PT Start Time  0730    PT Stop Time  0818    PT Time Calculation (min)  48 min    Activity Tolerance  Patient tolerated treatment well    Behavior During Therapy  [facility] for tasks assessed/performed       Past Medical History:  Diagnosis Date  . Basal cell carcinoma   . COVID-19   . Diabetes mellitus without complication ([place])   . Hypertension     Past Surgical History:  Procedure Laterality Date  . ABDOMINAL HYSTERECTOMY    . CESAREAN SECTION    . COLONOSCOPY N/A 01/19/2019   Procedure: COLONOSCOPY;  Surgeon: Danie Binder, MD;  [facility];  Service: Endoscopy;  Laterality: N/A;  9:30  . ENDOMETRIAL ABLATION    . POLYPECTOMY  01/19/2019   Procedure: POLYPECTOMY;  Surgeon: Danie Binder, MD;  [facility];  Service: Endoscopy;;  transverse colon   . REVERSE SHOULDER ARTHROPLASTY Right 01/25/2020   Procedure: TOTAL SHOULDER ARTHROPLASTY;  Surgeon: Hiram Gash, MD;  [facility];  Service: Orthopedics;  Laterality: Right;  . SKIN CANCER EXCISION  2018    There were no vitals filed for this visit.  Subjective Assessment - 02/15/20 0818    Subjective  COVID-19 screen performed prior to patient entering clinic.  Patient arrives with 2/10 pain.    Pertinent History  R TSA, 01/25/2020, HTN, DM    Limitations  Lifting;House hold activities    Diagnostic tests  x-ray    Patient Stated Goals  "return to all activities"    Currently in Pain?  Yes    Pain Score  2     Pain Location  Shoulder    Pain Orientation  Right    Pain Descriptors / Indicators  Sore    Pain Type  Surgical pain    Pain Onset  1 to 4 weeks ago    Pain Frequency  Constant         [facility] PT Assessment - 02/15/20 0001      Assessment   Medical Diagnosis  Right anatomic stemless total shoulder arthroplasty    Referring Provider (PT)  Ophelia Charter, MD    Onset Date/Surgical Date  01/25/20    Hand Dominance  Right    Next MD Visit  02/23/2020    Prior Therapy  no      Precautions   Precautions  Shoulder    Type of Shoulder Precautions  Follow Dr. Rich Fuchs TSA protocol                   [facility] Adult PT Treatment/Exercise - 02/15/20 0001      Modalities   Modalities  Electrical Stimulation      Electrical Stimulation   Electrical Stimulation Location  right shoulder    Electrical Stimulation Action  IFC    Electrical Stimulation Parameters  80-150 hz x15 mins  Electrical Stimulation Goals  Pain      Vasopneumatic   Number Minutes Vasopneumatic   15 minutes    Vasopnuematic Location   Shoulder    Vasopneumatic Pressure  Low    Vasopneumatic Temperature   34      Manual Therapy   Manual Therapy  Passive ROM    Passive ROM  PROM to right shoulder flexion, ER, abd within protocol limits                  PT Long Term Goals - 02/10/20 0746      PT LONG TERM GOAL #1   Title  Patient will be independent with HEP    Time  8    Period  Weeks    Status  On-going      PT LONG TERM GOAL #2   Title  Patient will report ability to perform ADLs with right shoulder pain less than or equal to 3/10.    Time  8    Period  Weeks    Status  On-going   not able to perfrom ADL's with UE at this time 02/10/20     PT LONG TERM GOAL #3   Title  Patient will demonstrate 55+ degrees of right shoulder ER AROM to improve donning and doffing apparel.    Time  8    Period  Weeks    Status  On-going      PT LONG TERM  GOAL #4   Title  Patient will demonstrate 140+ degrees of right shoulder flexion AROM to improve overhead activities.    Time  8    Period  Weeks    Status  On-going      PT LONG TERM GOAL #5   Title  Patient will demonstrate 4/5 or greater of right shoulder MMT in all planes to improve stability during functional tasks.    Time  8    Period  Weeks    Status  On-going            Plan - 02/15/20 [id number]    Clinical Impression Statement  Patient tolerated treatment well with minimal reports of increased right shoulder pain with PROM. Patient noted with increased pain with flexion and ER, minimal per report with ABD. Scar is healing well though there are a few areas where there is still scabbing. Patient inquired on hot tub and patient educated not to utilize at this time. Patient reported understanding.  Normal response to modalities upon removal.    Personal Factors and Comorbidities  Comorbidity 2    Comorbidities  right TSA 01/25/2020, HTN, DM    Examination-Activity Limitations  Dressing;Hygiene/Grooming;Toileting;Reach Overhead;Carry;Lift    Stability/Clinical Decision Making  Stable/Uncomplicated    Clinical Decision Making  Low    Rehab Potential  Good    PT Frequency  3x / week    PT Duration  8 weeks    PT Treatment/Interventions  ADLs/Self Care Home Management;Cryotherapy;Electrical Stimulation;Moist Heat;Iontophoresis 4mg /ml Dexamethasone;Neuromuscular re-education;Manual techniques;Passive range of motion;Therapeutic exercise;Therapeutic activities;Functional mobility training;Patient/family education;Vasopneumatic Device    PT Next Visit Plan  PROM to right shoulder, PROM goals by week 2 (02/08/2020): 120 flexion, 30 deg ER, 75 ABD without rotation; modalities PRN for pain relief.    PT Home Exercise Plan  see patient education section    Consulted and Agree with Plan of Care  Patient       Patient will benefit from skilled therapeutic intervention in order to improve the  following  deficits and impairments:  Decreased activity tolerance, Decreased range of motion, Decreased strength, Postural dysfunction, Pain, Impaired UE functional use, Decreased scar mobility  Visit Diagnosis: Acute pain of right shoulder  Stiffness of right shoulder, not elsewhere classified     Problem List Patient Active Problem List   Diagnosis Date Noted  . Glenoid fracture of shoulder 01/25/2020  . Diabetes mellitus ([place]) 12/09/2019  . COVID-19 12/01/2019  . H/O: hysterectomy 10/23/2018  . History of C-section 10/23/2018  . T2DM (type 2 diabetes mellitus) ([place]) 09/20/2017  . Morbid obesity with BMI of 40.0-44.9, adult ([place]) 02/13/2017  . Hypertension associated with type 2 diabetes mellitus ([place]) 02/14/2015    Gabriela Eves, PT, DPT 02/15/2020, 8:27 AM  [facility] [address] [place], [place], [address] Phone: [phone]   Fax:  [phone]  Name: Brandy Hernandez MRN: [id number] Date of Birth: 09-12-1966

## 2020-02-17 ENCOUNTER — Ambulatory Visit: Payer: BC Managed Care – PPO | Admitting: Physical Therapy

## 2020-02-17 ENCOUNTER — Encounter: Payer: Self-pay | Admitting: *Deleted

## 2020-02-17 ENCOUNTER — Encounter: Payer: Self-pay | Admitting: Physical Therapy

## 2020-02-17 ENCOUNTER — Other Ambulatory Visit: Payer: Self-pay

## 2020-02-17 DIAGNOSIS — M25511 Pain in right shoulder: Secondary | ICD-10-CM | POA: Diagnosis not present

## 2020-02-17 DIAGNOSIS — M25611 Stiffness of right shoulder, not elsewhere classified: Secondary | ICD-10-CM

## 2020-02-17 NOTE — Therapy (Signed)
[facility] [place], [place], [address] Phone: [phone]   Fax:  [phone]  Physical Therapy Treatment  Patient Details  Name: Brandy Hernandez MRN: [id number] Date of Birth: 1966-06-18 Referring Provider (PT): Ophelia Charter, MD   Encounter Date: 02/17/2020  PT End of Session - 02/17/20 0821    Visit Number  7    Number of Visits  24    Date for PT Re-Evaluation  04/06/20    Authorization Type  FOTO (initial IE: 75% limitation); Progress note every 10th visit    PT Start Time  0821    PT Stop Time  0904    PT Time Calculation (min)  43 min    Activity Tolerance  Patient tolerated treatment well    Behavior During Therapy  [facility] for tasks assessed/performed       Past Medical History:  Diagnosis Date  . Basal cell carcinoma   . COVID-19   . Diabetes mellitus without complication ([place])   . Hypertension     Past Surgical History:  Procedure Laterality Date  . ABDOMINAL HYSTERECTOMY    . CESAREAN SECTION    . COLONOSCOPY N/A 01/19/2019   Procedure: COLONOSCOPY;  Surgeon: Danie Binder, MD;  [facility];  Service: Endoscopy;  Laterality: N/A;  9:30  . ENDOMETRIAL ABLATION    . POLYPECTOMY  01/19/2019   Procedure: POLYPECTOMY;  Surgeon: Danie Binder, MD;  [facility];  Service: Endoscopy;;  transverse colon   . REVERSE SHOULDER ARTHROPLASTY Right 01/25/2020   Procedure: TOTAL SHOULDER ARTHROPLASTY;  Surgeon: Hiram Gash, MD;  [facility];  Service: Orthopedics;  Laterality: Right;  . SKIN CANCER EXCISION  2018    There were no vitals filed for this visit.  Subjective Assessment - 02/17/20 0813    Subjective  COVID-19 screen performed prior to patient entering clinic.  Reports difficulty sleeping and only able to take tylenol.    Pertinent History  R TSA, 01/25/2020, HTN, DM    Limitations  Lifting;House hold activities    Diagnostic tests  x-ray    Patient Stated Goals  "return to  all activities"    Currently in Pain?  Yes    Pain Score  3     Pain Location  Shoulder    Pain Orientation  Right    Pain Descriptors / Indicators  Sore    Pain Type  Surgical pain    Pain Onset  1 to 4 weeks ago    Pain Frequency  Constant         [facility] PT Assessment - 02/17/20 0001      Assessment   Medical Diagnosis  Right anatomic stemless total shoulder arthroplasty    Referring Provider (PT)  Ophelia Charter, MD    Onset Date/Surgical Date  01/25/20    Hand Dominance  Right    Next MD Visit  02/23/2020    Prior Therapy  no      Precautions   Precautions  Shoulder    Type of Shoulder Precautions  Follow Dr. Rich Fuchs TSA protocol                   [facility] Adult PT Treatment/Exercise - 02/17/20 0001      Modalities   Modalities  Electrical Stimulation;Vasopneumatic      Electrical Stimulation   Electrical Stimulation Location  right shoulder    Electrical Stimulation Action  IFC    Electrical Stimulation Parameters  80-150 hz x13min    Electrical Stimulation Goals  Pain      Vasopneumatic   Number Minutes Vasopneumatic   15 minutes    Vasopnuematic Location   Shoulder    Vasopneumatic Pressure  Low    Vasopneumatic Temperature   34      Manual Therapy   Manual Therapy  Passive ROM    Passive ROM  PROM of R shoulder into flex, ER with gentle holds at end range                  PT Long Term Goals - 02/10/20 0746      PT LONG TERM GOAL #1   Title  Patient will be independent with HEP    Time  8    Period  Weeks    Status  On-going      PT LONG TERM GOAL #2   Title  Patient will report ability to perform ADLs with right shoulder pain less than or equal to 3/10.    Time  8    Period  Weeks    Status  On-going   not able to perfrom ADL's with UE at this time 02/10/20     PT LONG TERM GOAL #3   Title  Patient will demonstrate 55+ degrees of right shoulder ER AROM to improve donning and doffing apparel.    Time  8    Period  Weeks     Status  On-going      PT LONG TERM GOAL #4   Title  Patient will demonstrate 140+ degrees of right shoulder flexion AROM to improve overhead activities.    Time  8    Period  Weeks    Status  On-going      PT LONG TERM GOAL #5   Title  Patient will demonstrate 4/5 or greater of right shoulder MMT in all planes to improve stability during functional tasks.    Time  8    Period  Weeks    Status  On-going            Plan - 02/17/20 1010    Clinical Impression Statement  Patient tolerated treatment fairly well as she arrived with low level R shoulder pain. Intermittant facial grimacing noted during PROM session with R shoulder. Firm end feels and smooth arc of motion noted following PROM session of R shoulder in all directions. Frequent oscillations were provided throughout PROM session to reduce muscle guarding. Normal modalities response noted following removal of the modalities.    Personal Factors and Comorbidities  Comorbidity 2    Comorbidities  right TSA 01/25/2020, HTN, DM    Examination-Activity Limitations  Dressing;Hygiene/Grooming;Toileting;Reach Overhead;Carry;Lift    Stability/Clinical Decision Making  Stable/Uncomplicated    Rehab Potential  Good    PT Frequency  3x / week    PT Duration  8 weeks    PT Treatment/Interventions  ADLs/Self Care Home Management;Cryotherapy;Electrical Stimulation;Moist Heat;Iontophoresis 4mg /ml Dexamethasone;Neuromuscular re-education;Manual techniques;Passive range of motion;Therapeutic exercise;Therapeutic activities;Functional mobility training;Patient/family education;Vasopneumatic Device    PT Next Visit Plan  PROM to right shoulder, PROM goals by week 2 (02/08/2020): 120 flexion, 30 deg ER, 75 ABD without rotation; modalities PRN for pain relief.    PT Home Exercise Plan  see patient education section    Consulted and Agree with Plan of Care  Patient       Patient will benefit from skilled therapeutic intervention in order to improve the  following deficits and impairments:  Decreased activity tolerance, Decreased range of motion, Decreased strength, Postural dysfunction, Pain, Impaired UE functional use, Decreased scar mobility  Visit Diagnosis: Acute pain of right shoulder  Stiffness of right shoulder, not elsewhere classified     Problem List Patient Active Problem List   Diagnosis Date Noted  . Glenoid fracture of shoulder 01/25/2020  . Diabetes mellitus ([place]) 12/09/2019  . COVID-19 12/01/2019  . H/O: hysterectomy 10/23/2018  . History of C-section 10/23/2018  . T2DM (type 2 diabetes mellitus) ([place]) 09/20/2017  . Morbid obesity with BMI of 40.0-44.9, adult ([place]) 02/13/2017  . Hypertension associated with type 2 diabetes mellitus ([place]) 02/14/2015    Standley Brooking, PTA 02/17/2020, 10:14 AM  [facility] [address] [place], [place], [address] Phone: [phone]   Fax:  [phone]  Name: Brandy Hernandez MRN: [id number] Date of Birth: Feb 17, 1966

## 2020-02-19 ENCOUNTER — Ambulatory Visit: Payer: BC Managed Care – PPO | Admitting: Physical Therapy

## 2020-02-19 ENCOUNTER — Other Ambulatory Visit: Payer: Self-pay

## 2020-02-19 DIAGNOSIS — M25511 Pain in right shoulder: Secondary | ICD-10-CM | POA: Diagnosis not present

## 2020-02-19 DIAGNOSIS — M25611 Stiffness of right shoulder, not elsewhere classified: Secondary | ICD-10-CM

## 2020-02-19 NOTE — Therapy (Signed)
[facility] [place], [place], [address] Phone: [phone]   Fax:  [phone]  Physical Therapy Treatment  Patient Details  Name: Brandy Hernandez MRN: [id number] Date of Birth: 24-Jan-1966 Referring Provider (PT): Ophelia Charter, MD   Encounter Date: 02/19/2020  PT End of Session - 02/19/20 0901    Visit Number  8    Number of Visits  24    Date for PT Re-Evaluation  04/06/20    Authorization Type  FOTO (initial IE: 75% limitation); Progress note every 10th visit    PT Start Time  0821    PT Stop Time  0912    PT Time Calculation (min)  51 min    Activity Tolerance  Patient tolerated treatment well    Behavior During Therapy  [facility] for tasks assessed/performed       Past Medical History:  Diagnosis Date  . Basal cell carcinoma   . COVID-19   . Diabetes mellitus without complication ([place])   . Hypertension     Past Surgical History:  Procedure Laterality Date  . ABDOMINAL HYSTERECTOMY    . CESAREAN SECTION    . COLONOSCOPY N/A 01/19/2019   Procedure: COLONOSCOPY;  Surgeon: Danie Binder, MD;  [facility];  Service: Endoscopy;  Laterality: N/A;  9:30  . ENDOMETRIAL ABLATION    . POLYPECTOMY  01/19/2019   Procedure: POLYPECTOMY;  Surgeon: Danie Binder, MD;  [facility];  Service: Endoscopy;;  transverse colon   . REVERSE SHOULDER ARTHROPLASTY Right 01/25/2020   Procedure: TOTAL SHOULDER ARTHROPLASTY;  Surgeon: Hiram Gash, MD;  [facility];  Service: Orthopedics;  Laterality: Right;  . SKIN CANCER EXCISION  2018    There were no vitals filed for this visit.      [facility] PT Assessment - 02/19/20 0001      Assessment   Medical Diagnosis  Right anatomic stemless total shoulder arthroplasty    Referring Provider (PT)  Ophelia Charter, MD    Onset Date/Surgical Date  01/25/20    Hand Dominance  Right    Next MD Visit  02/23/2020    Prior Therapy  no      Precautions   Precautions   Shoulder    Type of Shoulder Precautions  Follow Dr. Rich Fuchs TSA protocol                   [facility] Adult PT Treatment/Exercise - 02/19/20 0001      Modalities   Modalities  Electrical Stimulation;Vasopneumatic      Electrical Stimulation   Electrical Stimulation Location  right shoulder    Electrical Stimulation Action  IFC    Electrical Stimulation Parameters  80-150 hz x15 mins    Electrical Stimulation Goals  Pain      Vasopneumatic   Number Minutes Vasopneumatic   15 minutes    Vasopnuematic Location   Shoulder    Vasopneumatic Pressure  Low    Vasopneumatic Temperature   34      Manual Therapy   Manual Therapy  Passive ROM    Passive ROM  PROM of R shoulder into flex, ER, ABD with gentle holds at end range with intermittent oscillations for muscle relaxation                  PT Long Term Goals - 02/10/20 0746      PT LONG TERM GOAL #1   Title  Patient will be independent with  HEP    Time  8    Period  Weeks    Status  On-going      PT LONG TERM GOAL #2   Title  Patient will report ability to perform ADLs with right shoulder pain less than or equal to 3/10.    Time  8    Period  Weeks    Status  On-going   not able to perfrom ADL's with UE at this time 02/10/20     PT LONG TERM GOAL #3   Title  Patient will demonstrate 55+ degrees of right shoulder ER AROM to improve donning and doffing apparel.    Time  8    Period  Weeks    Status  On-going      PT LONG TERM GOAL #4   Title  Patient will demonstrate 140+ degrees of right shoulder flexion AROM to improve overhead activities.    Time  8    Period  Weeks    Status  On-going      PT LONG TERM GOAL #5   Title  Patient will demonstrate 4/5 or greater of right shoulder MMT in all planes to improve stability during functional tasks.    Time  8    Period  Weeks    Status  On-going            Plan - 02/19/20 0902    Clinical Impression Statement  Patient arrived with 4/10 pain.  Patient was able to tolerate right shoulder PROM but with increasing pain. Patient provided with oscillations and gentle STW/M to right shoulder to decrease pain. MD note next visit    Personal Factors and Comorbidities  Comorbidity 2    Comorbidities  right TSA 01/25/2020, HTN, DM    Examination-Activity Limitations  Dressing;Hygiene/Grooming;Toileting;Reach Overhead;Carry;Lift    Stability/Clinical Decision Making  Stable/Uncomplicated    Clinical Decision Making  Low    Rehab Potential  Good    PT Frequency  3x / week    PT Duration  8 weeks    PT Treatment/Interventions  ADLs/Self Care Home Management;Cryotherapy;Electrical Stimulation;Moist Heat;Iontophoresis 4mg /ml Dexamethasone;Neuromuscular re-education;Manual techniques;Passive range of motion;Therapeutic exercise;Therapeutic activities;Functional mobility training;Patient/family education;Vasopneumatic Device    PT Next Visit Plan  MD note next visit; PROM to right shoulder, begin pulleys and seated ranger, modalities PRN for pain relief.    PT Home Exercise Plan  see patient education section    Consulted and Agree with Plan of Care  Patient       Patient will benefit from skilled therapeutic intervention in order to improve the following deficits and impairments:  Decreased activity tolerance, Decreased range of motion, Decreased strength, Postural dysfunction, Pain, Impaired UE functional use, Decreased scar mobility  Visit Diagnosis: Acute pain of right shoulder  Stiffness of right shoulder, not elsewhere classified     Problem List Patient Active Problem List   Diagnosis Date Noted  . Glenoid fracture of shoulder 01/25/2020  . Diabetes mellitus ([place]) 12/09/2019  . COVID-19 12/01/2019  . H/O: hysterectomy 10/23/2018  . History of C-section 10/23/2018  . T2DM (type 2 diabetes mellitus) ([place]) 09/20/2017  . Morbid obesity with BMI of 40.0-44.9, adult ([place]) 02/13/2017  . Hypertension associated with type 2 diabetes  mellitus ([place]) 02/14/2015    Gabriela Eves, PT, DPT 02/19/2020, 9:34 AM  [facility] [address] [place], [place], [address] Phone: [phone]   Fax:  [phone]  Name: Brandy Hernandez MRN: [id number] Date of Birth: 06-23-1966

## 2020-02-22 ENCOUNTER — Other Ambulatory Visit: Payer: Self-pay

## 2020-02-22 ENCOUNTER — Encounter: Payer: Self-pay | Admitting: Physical Therapy

## 2020-02-22 ENCOUNTER — Ambulatory Visit: Payer: BC Managed Care – PPO | Admitting: Physical Therapy

## 2020-02-22 DIAGNOSIS — M25511 Pain in right shoulder: Secondary | ICD-10-CM

## 2020-02-22 DIAGNOSIS — M25611 Stiffness of right shoulder, not elsewhere classified: Secondary | ICD-10-CM

## 2020-02-22 NOTE — Therapy (Signed)
[facility] [place], [place], [address] Phone: [phone]   Fax:  [phone]  Physical Therapy Treatment  Patient Details  Name: Brandy Hernandez MRN: [id number] Date of Birth: Jan 29, 1966 Referring Provider (PT): Ophelia Charter, MD   Encounter Date: 02/22/2020  PT End of Session - 02/22/20 0906    Visit Number  9    Number of Visits  24    Date for PT Re-Evaluation  04/06/20    Authorization Type  FOTO (initial IE: 75% limitation); Progress note every 10th visit    PT Start Time  0815    PT Stop Time  0910    PT Time Calculation (min)  55 min    Behavior During Therapy  [facility] for tasks assessed/performed       Past Medical History:  Diagnosis Date  . Basal cell carcinoma   . COVID-19   . Diabetes mellitus without complication ([place])   . Hypertension     Past Surgical History:  Procedure Laterality Date  . ABDOMINAL HYSTERECTOMY    . CESAREAN SECTION    . COLONOSCOPY N/A 01/19/2019   Procedure: COLONOSCOPY;  Surgeon: Danie Binder, MD;  [facility];  Service: Endoscopy;  Laterality: N/A;  9:30  . ENDOMETRIAL ABLATION    . POLYPECTOMY  01/19/2019   Procedure: POLYPECTOMY;  Surgeon: Danie Binder, MD;  [facility];  Service: Endoscopy;;  transverse colon   . REVERSE SHOULDER ARTHROPLASTY Right 01/25/2020   Procedure: TOTAL SHOULDER ARTHROPLASTY;  Surgeon: Hiram Gash, MD;  [facility];  Service: Orthopedics;  Laterality: Right;  . SKIN CANCER EXCISION  2018    There were no vitals filed for this visit.  Subjective Assessment - 02/22/20 0905    Subjective  COVID-19 screen performed prior to patient entering clinic.  Reports arrives feeling "not too bad today"    Pertinent History  R TSA, 01/25/2020, HTN, DM    Limitations  Lifting;House hold activities    Diagnostic tests  x-ray    Patient Stated Goals  "return to all activities"    Currently in Pain?  Yes   did not provide number on  pain scale        [facility] PT Assessment - 02/22/20 0001      Assessment   Medical Diagnosis  Right anatomic stemless total shoulder arthroplasty    Referring Provider (PT)  Ophelia Charter, MD    Onset Date/Surgical Date  01/25/20    Hand Dominance  Right    Next MD Visit  02/23/2020    Prior Therapy  no      Precautions   Precautions  Shoulder    Type of Shoulder Precautions  Follow Dr. Rich Fuchs TSA protocol      PROM   Right Shoulder Flexion  124 Degrees    Right Shoulder ABduction  84 Degrees    Right Shoulder External Rotation  40 Degrees                   [facility] Adult PT Treatment/Exercise - 02/22/20 0001      Exercises   Exercises  Shoulder      Shoulder Exercises: Pulleys   Flexion  3 minutes      Shoulder Exercises: ROM/Strengthening   Ranger  Seated ranger flexion, CW & CCW circles x2 minutes each      Modalities   Modalities  Electrical Stimulation;Vasopneumatic      Acupuncturist  Stimulation Location  right shoulder    Electrical Stimulation Action  IFC    Electrical Stimulation Parameters  80-150 hz x15 mins    Electrical Stimulation Goals  Pain      Vasopneumatic   Number Minutes Vasopneumatic   15 minutes    Vasopnuematic Location   Shoulder    Vasopneumatic Pressure  Low    Vasopneumatic Temperature   34      Manual Therapy   Manual Therapy  Passive ROM    Passive ROM  PROM of R shoulder into flex, ER, ABD with gentle holds at end range with intermittent oscillations for muscle relaxation                  PT Long Term Goals - 02/10/20 0746      PT LONG TERM GOAL #1   Title  Patient will be independent with HEP    Time  8    Period  Weeks    Status  On-going      PT LONG TERM GOAL #2   Title  Patient will report ability to perform ADLs with right shoulder pain less than or equal to 3/10.    Time  8    Period  Weeks    Status  On-going   not able to perfrom ADL's with UE at this time 02/10/20     PT  LONG TERM GOAL #3   Title  Patient will demonstrate 55+ degrees of right shoulder ER AROM to improve donning and doffing apparel.    Time  8    Period  Weeks    Status  On-going      PT LONG TERM GOAL #4   Title  Patient will demonstrate 140+ degrees of right shoulder flexion AROM to improve overhead activities.    Time  8    Period  Weeks    Status  On-going      PT LONG TERM GOAL #5   Title  Patient will demonstrate 4/5 or greater of right shoulder MMT in all planes to improve stability during functional tasks.    Time  8    Period  Weeks    Status  On-going            Plan - 02/22/20 0906    Clinical Impression Statement  Patient responded well to progression of right shoulder AAROM. Pulleys and seated ranger initiated with good response but frequent verbal cuing for muscle relaxation and to prevent shoulder hiking. Patient noted with improved right shoulder PROM, see measurements. Normal response to modalities upon removal.    Personal Factors and Comorbidities  Comorbidity 2    Comorbidities  right TSA 01/25/2020, HTN, DM    Examination-Activity Limitations  Dressing;Hygiene/Grooming;Toileting;Reach Overhead;Carry;Lift    Stability/Clinical Decision Making  Stable/Uncomplicated    Clinical Decision Making  Low    Rehab Potential  Good    PT Frequency  3x / week    PT Duration  8 weeks    PT Treatment/Interventions  ADLs/Self Care Home Management;Cryotherapy;Electrical Stimulation;Moist Heat;Iontophoresis 4mg /ml Dexamethasone;Neuromuscular re-education;Manual techniques;Passive range of motion;Therapeutic exercise;Therapeutic activities;Functional mobility training;Patient/family education;Vasopneumatic Device    PT Next Visit Plan  PROM to right shoulder, begin pulleys and seated ranger, modalities PRN for pain relief.    PT Home Exercise Plan  see patient education section    Consulted and Agree with Plan of Care  Patient       Patient will benefit from skilled  therapeutic intervention in order  to improve the following deficits and impairments:  Decreased activity tolerance, Decreased range of motion, Decreased strength, Postural dysfunction, Pain, Impaired UE functional use, Decreased scar mobility  Visit Diagnosis: Acute pain of right shoulder  Stiffness of right shoulder, not elsewhere classified     Problem List Patient Active Problem List   Diagnosis Date Noted  . Glenoid fracture of shoulder 01/25/2020  . Diabetes mellitus ([place]) 12/09/2019  . COVID-19 12/01/2019  . H/O: hysterectomy 10/23/2018  . History of C-section 10/23/2018  . T2DM (type 2 diabetes mellitus) ([place]) 09/20/2017  . Morbid obesity with BMI of 40.0-44.9, adult ([place]) 02/13/2017  . Hypertension associated with type 2 diabetes mellitus ([place]) 02/14/2015    Gabriela Eves, PT, DPT 02/22/2020, 9:18 AM  [facility] [address] [place], [place], [address] Phone: [phone]   Fax:  [phone]  Name: Brandy Hernandez MRN: [id number] Date of Birth: August 29, 1966

## 2020-02-24 ENCOUNTER — Other Ambulatory Visit: Payer: Self-pay | Admitting: Family Medicine

## 2020-02-24 ENCOUNTER — Ambulatory Visit: Payer: BC Managed Care – PPO | Admitting: Physical Therapy

## 2020-02-24 ENCOUNTER — Other Ambulatory Visit: Payer: Self-pay

## 2020-02-24 DIAGNOSIS — E1159 Type 2 diabetes mellitus with other circulatory complications: Secondary | ICD-10-CM

## 2020-02-24 DIAGNOSIS — M25611 Stiffness of right shoulder, not elsewhere classified: Secondary | ICD-10-CM

## 2020-02-24 DIAGNOSIS — I152 Hypertension secondary to endocrine disorders: Secondary | ICD-10-CM

## 2020-02-24 DIAGNOSIS — M25511 Pain in right shoulder: Secondary | ICD-10-CM

## 2020-02-24 NOTE — Therapy (Signed)
[facility] [place], [place], [address] Phone: [phone]   Fax:  [phone]  Physical Therapy Treatment   Progress Note Reporting Period 02/03/2020 to 02/24/2020  See note below for Objective Data and Assessment of Progress/Goals. Patient is progressing well through therapy. Patient able to get out of the sling. Gabriela Eves, PT, DPT     Patient Details  Name: Brandy Hernandez MRN: [id number] Date of Birth: [birth year] Referring Provider (PT): Ophelia Charter, MD   Encounter Date: 02/24/2020  PT End of Session - 02/24/20 1033    Visit Number  10    Number of Visits  24    Date for PT Re-Evaluation  04/06/20    Authorization Type  FOTO (initial IE: 75% limitation); Progress note every 10th visit    PT Start Time  0815    PT Stop Time  0910    PT Time Calculation (min)  55 min    Activity Tolerance  Patient tolerated treatment well    Behavior During Therapy  [facility] for tasks assessed/performed       Past Medical History:  Diagnosis Date  . Basal cell carcinoma   . COVID-19   . Diabetes mellitus without complication ([place])   . Hypertension     Past Surgical History:  Procedure Laterality Date  . ABDOMINAL HYSTERECTOMY    . CESAREAN SECTION    . COLONOSCOPY N/A 01/19/2019   Procedure: COLONOSCOPY;  Surgeon: Danie Binder, MD;  [facility];  Service: Endoscopy;  Laterality: N/A;  9:30  . ENDOMETRIAL ABLATION    . POLYPECTOMY  01/19/2019   Procedure: POLYPECTOMY;  Surgeon: Danie Binder, MD;  [facility];  Service: Endoscopy;;  transverse colon   . REVERSE SHOULDER ARTHROPLASTY Right 01/25/2020   Procedure: TOTAL SHOULDER ARTHROPLASTY;  Surgeon: Hiram Gash, MD;  [facility];  Service: Orthopedics;  Laterality: Right;  . SKIN CANCER EXCISION  2018    There were no vitals filed for this visit.  Subjective Assessment - 02/24/20 1001    Subjective  COVID-19 screen performed prior to  patient entering clinic.  Reports MD follow up went well and is out of the sling. Can start to play the piano but unable to go behind the back or perform any quick movements with the arm.    Pertinent History  R TSA, 01/25/2020, HTN, DM    Limitations  Lifting;House hold activities    Diagnostic tests  x-ray    Patient Stated Goals  "return to all activities"    Currently in Pain?  Yes    Pain Location  Shoulder    Pain Orientation  Right    Pain Descriptors / Indicators  Sore    Pain Type  Surgical pain    Pain Onset  1 to 4 weeks ago    Pain Frequency  Constant         [facility] PT Assessment - 02/24/20 0001      Assessment   Medical Diagnosis  Right anatomic stemless total shoulder arthroplasty    Referring Provider (PT)  Ophelia Charter, MD    Onset Date/Surgical Date  01/25/20    Hand Dominance  Right    Next MD Visit  04/19/2020    Prior Therapy  no      Precautions   Precautions  Shoulder    Type of Shoulder Precautions  Follow Dr. Rich Fuchs TSA protocol  [facility] Adult PT Treatment/Exercise - 02/24/20 0001      Exercises   Exercises  Shoulder      Shoulder Exercises: Supine   External Rotation  AAROM;Right;15 reps      Shoulder Exercises: Pulleys   Flexion  5 minutes      Shoulder Exercises: ROM/Strengthening   Ranger  Seated ranger flexion, CW & CCW circles x2 minutes each      Modalities   Modalities  Electrical Stimulation;Vasopneumatic      Electrical Stimulation   Electrical Stimulation Location  right shoulder    Electrical Stimulation Action  IFC    Electrical Stimulation Parameters  80-150 hz x15 mins    Electrical Stimulation Goals  Pain      Vasopneumatic   Number Minutes Vasopneumatic   15 minutes    Vasopnuematic Location   Shoulder    Vasopneumatic Pressure  Low    Vasopneumatic Temperature   34      Manual Therapy   Manual Therapy  Passive ROM    Passive ROM  PROM of R shoulder into flex, ER, ABD with gentle holds at  end range with intermittent oscillations for muscle relaxation                  PT Long Term Goals - 02/10/20 0746      PT LONG TERM GOAL #1   Title  Patient will be independent with HEP    Time  8    Period  Weeks    Status  On-going      PT LONG TERM GOAL #2   Title  Patient will report ability to perform ADLs with right shoulder pain less than or equal to 3/10.    Time  8    Period  Weeks    Status  On-going   not able to perfrom ADL's with UE at this time 02/10/20     PT LONG TERM GOAL #3   Title  Patient will demonstrate 55+ degrees of right shoulder ER AROM to improve donning and doffing apparel.    Time  8    Period  Weeks    Status  On-going      PT LONG TERM GOAL #4   Title  Patient will demonstrate 140+ degrees of right shoulder flexion AROM to improve overhead activities.    Time  8    Period  Weeks    Status  On-going      PT LONG TERM GOAL #5   Title  Patient will demonstrate 4/5 or greater of right shoulder MMT in all planes to improve stability during functional tasks.    Time  8    Period  Weeks    Status  On-going            Plan - 02/24/20 1027    Clinical Impression Statement  Patient responded well to therapy session. Patient required tactile cuing to prevent UT utilization during right shoulder AAROM exercises. Patient and PT reviewed HEP to which patient required manual placement for proper ER AAROM. Patient demonstrated improved form. No adverse affects upon removal of modalities.    Personal Factors and Comorbidities  Comorbidity 2    Comorbidities  right TSA 01/25/2020, HTN, DM    Examination-Activity Limitations  Dressing;Hygiene/Grooming;Toileting;Reach Overhead;Carry;Lift    Stability/Clinical Decision Making  Stable/Uncomplicated    Clinical Decision Making  Low    Rehab Potential  Good    PT Frequency  3x / week  PT Duration  8 weeks    PT Treatment/Interventions  ADLs/Self Care Home Management;Cryotherapy;Electrical  Stimulation;Moist Heat;Iontophoresis 4mg /ml Dexamethasone;Neuromuscular re-education;Manual techniques;Passive range of motion;Therapeutic exercise;Therapeutic activities;Functional mobility training;Patient/family education;Vasopneumatic Device    PT Next Visit Plan  PROM to right shoulder, begin pulleys and seated ranger, modalities PRN for pain relief.    PT Home Exercise Plan  see patient education section    Consulted and Agree with Plan of Care  Patient       Patient will benefit from skilled therapeutic intervention in order to improve the following deficits and impairments:  Decreased activity tolerance, Decreased range of motion, Decreased strength, Postural dysfunction, Pain, Impaired UE functional use, Decreased scar mobility  Visit Diagnosis: Acute pain of right shoulder  Stiffness of right shoulder, not elsewhere classified     Problem List Patient Active Problem List   Diagnosis Date Noted  . Glenoid fracture of shoulder 01/25/2020  . Diabetes mellitus ([place]) 12/09/2019  . COVID-19 12/01/2019  . H/O: hysterectomy 10/23/2018  . History of C-section 10/23/2018  . T2DM (type 2 diabetes mellitus) ([place]) 09/20/2017  . Morbid obesity with BMI of 40.0-44.9, adult ([place]) 02/13/2017  . Hypertension associated with type 2 diabetes mellitus ([place]) 02/14/2015    Gabriela Eves, PT, DPT 02/24/2020, 10:48 AM  [facility] [address] [place], [place], [address] Phone: [phone]   Fax:  [phone]  Name: Brandy Hernandez MRN: [id number] Date of Birth: 04-Oct-1966

## 2020-02-26 ENCOUNTER — Other Ambulatory Visit: Payer: Self-pay

## 2020-02-26 ENCOUNTER — Encounter: Payer: Self-pay | Admitting: Physical Therapy

## 2020-02-26 ENCOUNTER — Ambulatory Visit: Payer: BC Managed Care – PPO | Admitting: Physical Therapy

## 2020-02-26 DIAGNOSIS — M25511 Pain in right shoulder: Secondary | ICD-10-CM

## 2020-02-26 DIAGNOSIS — M25611 Stiffness of right shoulder, not elsewhere classified: Secondary | ICD-10-CM

## 2020-02-26 NOTE — Therapy (Signed)
[facility] [place], [place], [address] Phone: [phone]   Fax:  [phone]  Physical Therapy Treatment  Patient Details  Name: Brandy Hernandez MRN: [id number] Date of Birth: 1966/05/04 Referring Provider (PT): Ophelia Charter, MD   Encounter Date: 02/26/2020  PT End of Session - 02/26/20 0916    Visit Number  11    Number of Visits  24    Date for PT Re-Evaluation  04/06/20    Authorization Type  FOTO (initial IE: 75% limitation); Progress note every 10th visit    PT Start Time  0815    PT Stop Time  0910    PT Time Calculation (min)  55 min    Activity Tolerance  Patient tolerated treatment well    Behavior During Therapy  [facility] for tasks assessed/performed       Past Medical History:  Diagnosis Date  . Basal cell carcinoma   . COVID-19   . Diabetes mellitus without complication ([place])   . Hypertension     Past Surgical History:  Procedure Laterality Date  . ABDOMINAL HYSTERECTOMY    . CESAREAN SECTION    . COLONOSCOPY N/A 01/19/2019   Procedure: COLONOSCOPY;  Surgeon: Danie Binder, MD;  [facility];  Service: Endoscopy;  Laterality: N/A;  9:30  . ENDOMETRIAL ABLATION    . POLYPECTOMY  01/19/2019   Procedure: POLYPECTOMY;  Surgeon: Danie Binder, MD;  [facility];  Service: Endoscopy;;  transverse colon   . REVERSE SHOULDER ARTHROPLASTY Right 01/25/2020   Procedure: TOTAL SHOULDER ARTHROPLASTY;  Surgeon: Hiram Gash, MD;  [facility]: [facility];  Service: Orthopedics;  Laterality: Right;  . SKIN CANCER EXCISION  2018    There were no vitals filed for this visit.  Subjective Assessment - 02/26/20 0829    Subjective  COVID-19 screening performed upon arrival. Patient arrives with a 2-3/10 and felt like she rested better last night.    Pertinent History  R TSA, 01/25/2020, HTN, DM    Limitations  Lifting;House hold activities    Diagnostic tests  x-ray    Patient Stated Goals  "return to  all activities"    Currently in Pain?  Yes    Pain Score  3     Pain Location  Shoulder    Pain Orientation  Right    Pain Descriptors / Indicators  Sore    Pain Type  Surgical pain    Pain Onset  1 to 4 weeks ago    Pain Frequency  Constant         [facility] PT Assessment - 02/26/20 0001      Assessment   Medical Diagnosis  Right anatomic stemless total shoulder arthroplasty    Referring Provider (PT)  Ophelia Charter, MD    Onset Date/Surgical Date  01/25/20    Hand Dominance  Right    Next MD Visit  04/19/2020    Prior Therapy  no      Precautions   Precautions  Shoulder    Type of Shoulder Precautions  Follow Dr. Rich Fuchs TSA protocol                   [facility] Adult PT Treatment/Exercise - 02/26/20 0001      Exercises   Exercises  Shoulder      Shoulder Exercises: Supine   Protraction  AAROM;10 reps;Both    Flexion  AAROM;Both;10 reps      Shoulder Exercises: Pulleys  Flexion  5 minutes      Shoulder Exercises: ROM/Strengthening   Ranger  Seated ranger flexion, CW & CCW circles x3 minutes each      Modalities   Modalities  Electrical Stimulation;Vasopneumatic      Electrical Stimulation   Electrical Stimulation Location  right shoulder    Electrical Stimulation Action  IFC    Electrical Stimulation Parameters  80-150 hz x10 mins    Electrical Stimulation Goals  Pain      Vasopneumatic   Number Minutes Vasopneumatic   10 minutes    Vasopnuematic Location   Shoulder    Vasopneumatic Pressure  Low    Vasopneumatic Temperature   34      Manual Therapy   Manual Therapy  Passive ROM    Passive ROM  PROM of R shoulder into flex, ER, ABD with gentle holds at end range with intermittent oscillations for muscle relaxation                  PT Long Term Goals - 02/10/20 0746      PT LONG TERM GOAL #1   Title  Patient will be independent with HEP    Time  8    Period  Weeks    Status  On-going      PT LONG TERM GOAL #2   Title  Patient will  report ability to perform ADLs with right shoulder pain less than or equal to 3/10.    Time  8    Period  Weeks    Status  On-going   not able to perfrom ADL's with UE at this time 02/10/20     PT LONG TERM GOAL #3   Title  Patient will demonstrate 55+ degrees of right shoulder ER AROM to improve donning and doffing apparel.    Time  8    Period  Weeks    Status  On-going      PT LONG TERM GOAL #4   Title  Patient will demonstrate 140+ degrees of right shoulder flexion AROM to improve overhead activities.    Time  8    Period  Weeks    Status  On-going      PT LONG TERM GOAL #5   Title  Patient will demonstrate 4/5 or greater of right shoulder MMT in all planes to improve stability during functional tasks.    Time  8    Period  Weeks    Status  On-going            Plan - 02/26/20 0908    Clinical Impression Statement  Patient was able to tolerate treatment well with progression of AAROM. Patient provided with support at the right elbow during supine AAROM as needed. Patient instructed on new HEP to which patient reported understanding. Normal response to modalities upon removal.    Personal Factors and Comorbidities  Comorbidity 2    Comorbidities  right TSA 01/25/2020, HTN, DM    Examination-Activity Limitations  Dressing;Hygiene/Grooming;Toileting;Reach Overhead;Carry;Lift    Stability/Clinical Decision Making  Stable/Uncomplicated    Clinical Decision Making  Low    Rehab Potential  Good    PT Frequency  3x / week    PT Duration  8 weeks    PT Treatment/Interventions  ADLs/Self Care Home Management;Cryotherapy;Electrical Stimulation;Moist Heat;Iontophoresis 4mg /ml Dexamethasone;Neuromuscular re-education;Manual techniques;Passive range of motion;Therapeutic exercise;Therapeutic activities;Functional mobility training;Patient/family education;Vasopneumatic Device    PT Next Visit Plan  PROM to right shoulder, begin pulleys and seated ranger, modalities  PRN for pain relief.     PT Home Exercise Plan  see patient education section    Consulted and Agree with Plan of Care  Patient       Patient will benefit from skilled therapeutic intervention in order to improve the following deficits and impairments:  Decreased activity tolerance, Decreased range of motion, Decreased strength, Postural dysfunction, Pain, Impaired UE functional use, Decreased scar mobility  Visit Diagnosis: Acute pain of right shoulder  Stiffness of right shoulder, not elsewhere classified     Problem List Patient Active Problem List   Diagnosis Date Noted  . Glenoid fracture of shoulder 01/25/2020  . Diabetes mellitus ([place]) 12/09/2019  . COVID-19 12/01/2019  . H/O: hysterectomy 10/23/2018  . History of C-section 10/23/2018  . T2DM (type 2 diabetes mellitus) ([place]) 09/20/2017  . Morbid obesity with BMI of 40.0-44.9, adult ([place]) 02/13/2017  . Hypertension associated with type 2 diabetes mellitus ([place]) 02/14/2015    Gabriela Eves, PT, DPT 02/26/2020, 9:17 AM  [facility] [address] [place], [place], [address] Phone: [phone]   Fax:  [phone]  Name: Brandy Hernandez MRN: [id number] Date of Birth: 1966/04/17

## 2020-02-29 ENCOUNTER — Ambulatory Visit: Payer: BC Managed Care – PPO | Admitting: Physical Therapy

## 2020-02-29 ENCOUNTER — Other Ambulatory Visit: Payer: Self-pay

## 2020-02-29 ENCOUNTER — Encounter: Payer: Self-pay | Admitting: Physical Therapy

## 2020-02-29 DIAGNOSIS — M25511 Pain in right shoulder: Secondary | ICD-10-CM | POA: Diagnosis not present

## 2020-02-29 DIAGNOSIS — M25611 Stiffness of right shoulder, not elsewhere classified: Secondary | ICD-10-CM

## 2020-02-29 NOTE — Therapy (Addendum)
[facility] [place], [place], [address] Phone: [phone]   Fax:  [phone]  Physical Therapy Treatment  Patient Details  Name: Brandy Hernandez MRN: [id number] Date of Birth: [birth year] Referring Provider (PT): Ophelia Charter, MD   Encounter Date: 02/29/2020  PT End of Session - 02/29/20 0820    Visit Number  12    Number of Visits  24    Date for PT Re-Evaluation  04/06/20    Authorization Type  FOTO (12th visit: 60% limitation); Progress note every 10th visit    PT Start Time  0815    PT Stop Time  0908    PT Time Calculation (min)  53 min    Activity Tolerance  Patient tolerated treatment well    Behavior During Therapy  [facility] for tasks assessed/performed       Past Medical History:  Diagnosis Date  . Basal cell carcinoma   . COVID-19   . Diabetes mellitus without complication ([place])   . Hypertension     Past Surgical History:  Procedure Laterality Date  . ABDOMINAL HYSTERECTOMY    . CESAREAN SECTION    . COLONOSCOPY N/A 01/19/2019   Procedure: COLONOSCOPY;  Surgeon: Danie Binder, MD;  [facility];  Service: Endoscopy;  Laterality: N/A;  9:30  . ENDOMETRIAL ABLATION    . POLYPECTOMY  01/19/2019   Procedure: POLYPECTOMY;  Surgeon: Danie Binder, MD;  [facility];  Service: Endoscopy;;  transverse colon   . REVERSE SHOULDER ARTHROPLASTY Right 01/25/2020   Procedure: TOTAL SHOULDER ARTHROPLASTY;  Surgeon: Hiram Gash, MD;  [facility];  Service: Orthopedics;  Laterality: Right;  . SKIN CANCER EXCISION  2018    There were no vitals filed for this visit.  Subjective Assessment - 02/29/20 0817    Subjective  COVID-19 screening performed upon arrival. Patient arrives with a 2/10 pain in right shoulder.    Pertinent History  R TSA, 01/25/2020, HTN, DM    Limitations  Lifting;House hold activities    Diagnostic tests  x-ray    Patient Stated Goals  "return to all activities"    Currently in Pain?  Yes    Pain Score  2     Pain Location  Shoulder    Pain Orientation  Right    Pain Type  Surgical pain    Pain Onset  1 to 4 weeks ago    Pain Frequency  Constant         [facility] PT Assessment - 02/29/20 0001      Assessment   Medical Diagnosis  Right anatomic stemless total shoulder arthroplasty    Referring Provider (PT)  Ophelia Charter, MD    Onset Date/Surgical Date  01/25/20    Hand Dominance  Right    Next MD Visit  04/19/2020    Prior Therapy  no      Precautions   Precautions  Shoulder    Type of Shoulder Precautions  Follow Dr. Rich Fuchs TSA protocol      Observation/Other Assessments   Focus on Therapeutic Outcomes (FOTO)   60% limitation (visit 12)                   [facility] Adult PT Treatment/Exercise - 02/29/20 0001      Exercises   Exercises  Shoulder      Shoulder Exercises: Pulleys   Flexion  5 minutes      Shoulder Exercises: ROM/Strengthening  Wall Wash  circles CW, CCW, and flexion 2x10 each    Other ROM/Strengthening Exercises  wall ladder x10 level 20      Shoulder Exercises: Stretch   Table Stretch - Flexion  20 seconds;3 reps      Manual Therapy   Manual Therapy  Passive ROM    Passive ROM  PROM of R shoulder into flex, ER, ABD with gentle holds at end range with intermittent oscillations for muscle relaxation       Vasopneumatic device to right shoulder low 34 degrees x10 mins; IFC to right shoulder at 80-150 hz x10 mins Gabriela Eves, PT, DPT           PT Long Term Goals - 02/10/20 0746      PT LONG TERM GOAL #1   Title  Patient will be independent with HEP    Time  8    Period  Weeks    Status  On-going      PT LONG TERM GOAL #2   Title  Patient will report ability to perform ADLs with right shoulder pain less than or equal to 3/10.    Time  8    Period  Weeks    Status  On-going   not able to perfrom ADL's with UE at this time 02/10/20     PT LONG TERM GOAL #3   Title  Patient will  demonstrate 55+ degrees of right shoulder ER AROM to improve donning and doffing apparel.    Time  8    Period  Weeks    Status  On-going      PT LONG TERM GOAL #4   Title  Patient will demonstrate 140+ degrees of right shoulder flexion AROM to improve overhead activities.    Time  8    Period  Weeks    Status  On-going      PT LONG TERM GOAL #5   Title  Patient will demonstrate 4/5 or greater of right shoulder MMT in all planes to improve stability during functional tasks.    Time  8    Period  Weeks    Status  On-going            Plan - 02/29/20 [id number]    Clinical Impression Statement  Patient responded well to progression of AAROM. Patient required intermittent UE support from left arm for wall wash. Intermittent tactile cuing to prevent UT compensatory movements and trunk compensations. Decreased guarding with right shoulder PROM. No adverse affects upon removal of modaiites.    Personal Factors and Comorbidities  Comorbidity 2    Comorbidities  right TSA 01/25/2020, HTN, DM    Examination-Activity Limitations  Dressing;Hygiene/Grooming;Toileting;Reach Overhead;Carry;Lift    Stability/Clinical Decision Making  Stable/Uncomplicated    Clinical Decision Making  Low    Rehab Potential  Good    PT Frequency  3x / week    PT Duration  8 weeks    PT Treatment/Interventions  ADLs/Self Care Home Management;Cryotherapy;Electrical Stimulation;Moist Heat;Iontophoresis 4mg /ml Dexamethasone;Neuromuscular re-education;Manual techniques;Passive range of motion;Therapeutic exercise;Therapeutic activities;Functional mobility training;Patient/family education;Vasopneumatic Device    PT Next Visit Plan  PROM to right shoulder, begin pulleys and seated ranger, modalities PRN for pain relief.    PT Home Exercise Plan  see patient education section    Consulted and Agree with Plan of Care  Patient       Patient will benefit from skilled therapeutic intervention in order to improve the following  deficits and impairments:  Decreased activity  tolerance, Decreased range of motion, Decreased strength, Postural dysfunction, Pain, Impaired UE functional use, Decreased scar mobility  Visit Diagnosis: Acute pain of right shoulder  Stiffness of right shoulder, not elsewhere classified     Problem List Patient Active Problem List   Diagnosis Date Noted  . Glenoid fracture of shoulder 01/25/2020  . Diabetes mellitus ([place]) 12/09/2019  . COVID-19 12/01/2019  . H/O: hysterectomy 10/23/2018  . History of C-section 10/23/2018  . T2DM (type 2 diabetes mellitus) ([place]) 09/20/2017  . Morbid obesity with BMI of 40.0-44.9, adult ([place]) 02/13/2017  . Hypertension associated with type 2 diabetes mellitus ([place]) 02/14/2015    Gabriela Eves, PT, DPT 02/29/2020, 9:13 AM  [facility] [address] [place], [place], [address] Phone: [phone]   Fax:  [phone]  Name: Brandy Hernandez MRN: [id number] Date of Birth: 23-Dec-1965

## 2020-03-02 ENCOUNTER — Ambulatory Visit: Payer: BC Managed Care – PPO | Admitting: Physical Therapy

## 2020-03-02 ENCOUNTER — Encounter: Payer: Self-pay | Admitting: Physical Therapy

## 2020-03-02 ENCOUNTER — Other Ambulatory Visit: Payer: Self-pay

## 2020-03-02 DIAGNOSIS — M25511 Pain in right shoulder: Secondary | ICD-10-CM

## 2020-03-02 DIAGNOSIS — M25611 Stiffness of right shoulder, not elsewhere classified: Secondary | ICD-10-CM

## 2020-03-02 NOTE — Therapy (Signed)
[facility] [place], [place], [address] Phone: [phone]   Fax:  [phone]  Physical Therapy Treatment  Patient Details  Name: Brandy Hernandez MRN: [id number] Date of Birth: 26-May-1966 Referring Provider (PT): Ophelia Charter, MD   Encounter Date: 03/02/2020  PT End of Session - 03/02/20 0829    Visit Number  13    Number of Visits  24    Date for PT Re-Evaluation  04/06/20    Authorization Type  FOTO (12th visit: 60% limitation); Progress note every 10th visit    PT Start Time  0815    PT Stop Time  0907    PT Time Calculation (min)  52 min    Activity Tolerance  Patient tolerated treatment well    Behavior During Therapy  [facility] for tasks assessed/performed       Past Medical History:  Diagnosis Date  . Basal cell carcinoma   . COVID-19   . Diabetes mellitus without complication ([place])   . Hypertension     Past Surgical History:  Procedure Laterality Date  . ABDOMINAL HYSTERECTOMY    . CESAREAN SECTION    . COLONOSCOPY N/A 01/19/2019   Procedure: COLONOSCOPY;  Surgeon: Danie Binder, MD;  [facility];  Service: Endoscopy;  Laterality: N/A;  9:30  . ENDOMETRIAL ABLATION    . POLYPECTOMY  01/19/2019   Procedure: POLYPECTOMY;  Surgeon: Danie Binder, MD;  [facility];  Service: Endoscopy;;  transverse colon   . REVERSE SHOULDER ARTHROPLASTY Right 01/25/2020   Procedure: TOTAL SHOULDER ARTHROPLASTY;  Surgeon: Hiram Gash, MD;  [facility];  Service: Orthopedics;  Laterality: Right;  . SKIN CANCER EXCISION  2018    There were no vitals filed for this visit.  Subjective Assessment - 03/02/20 1804    Subjective  COVID-19 screening performed upon arrival. Patient arrives returning back to work went well.    Pertinent History  R TSA, 01/25/2020, HTN, DM    Limitations  Lifting;House hold activities    Diagnostic tests  x-ray    Patient Stated Goals  "return to all activities"    Currently in Pain?  Yes    Pain Score  2     Pain Location  Shoulder    Pain Orientation  Right    Pain Descriptors / Indicators  Sore    Pain Type  Surgical pain    Pain Onset  1 to 4 weeks ago    Pain Frequency  Constant         [facility] PT Assessment - 03/02/20 0001      Assessment   Medical Diagnosis  Right anatomic stemless total shoulder arthroplasty    Referring Provider (PT)  Ophelia Charter, MD    Onset Date/Surgical Date  01/25/20    Hand Dominance  Right    Next MD Visit  04/19/2020    Prior Therapy  no      Precautions   Precautions  Shoulder    Type of Shoulder Precautions  Follow Dr. Rich Fuchs TSA protocol      PROM   Right Shoulder Flexion  133 Degrees    Right Shoulder ABduction  100 Degrees    Right Shoulder External Rotation  45 Degrees                   [facility] Adult PT Treatment/Exercise - 03/02/20 0001      Exercises   Exercises  Shoulder  Shoulder Exercises: Pulleys   Flexion  5 minutes      Shoulder Exercises: ROM/Strengthening   Wall Wash  circles CW, CCW, and flexion x2 mins each      Modalities   Modalities  Electrical Stimulation;Vasopneumatic      Electrical Stimulation   Electrical Stimulation Location  right shoulder    Electrical Stimulation Action  IFC    Electrical Stimulation Parameters  80-150 hz x10 mins    Electrical Stimulation Goals  Pain      Vasopneumatic   Number Minutes Vasopneumatic   10 minutes    Vasopnuematic Location   Shoulder    Vasopneumatic Pressure  Low    Vasopneumatic Temperature   34      Manual Therapy   Manual Therapy  Passive ROM    Passive ROM  PROM of R shoulder into flex, ER, ABD with gentle holds at end range with intermittent oscillations for muscle relaxation                  PT Long Term Goals - 02/10/20 0746      PT LONG TERM GOAL #1   Title  Patient will be independent with HEP    Time  8    Period  Weeks    Status  On-going      PT LONG TERM GOAL #2   Title   Patient will report ability to perform ADLs with right shoulder pain less than or equal to 3/10.    Time  8    Period  Weeks    Status  On-going   not able to perfrom ADL's with UE at this time 02/10/20     PT LONG TERM GOAL #3   Title  Patient will demonstrate 55+ degrees of right shoulder ER AROM to improve donning and doffing apparel.    Time  8    Period  Weeks    Status  On-going      PT LONG TERM GOAL #4   Title  Patient will demonstrate 140+ degrees of right shoulder flexion AROM to improve overhead activities.    Time  8    Period  Weeks    Status  On-going      PT LONG TERM GOAL #5   Title  Patient will demonstrate 4/5 or greater of right shoulder MMT in all planes to improve stability during functional tasks.    Time  8    Period  Weeks    Status  On-going            Plan - 03/02/20 1807    Clinical Impression Statement  Patient responded fairly well to therapy session though she still continues to demonstrate rigt UT compensatory motions during pulleys and wall wash. Patient noted wih improved PROM, see measurements. No adverse affects upon removal of modalites.    Personal Factors and Comorbidities  Comorbidity 2    Comorbidities  right TSA 01/25/2020, HTN, DM    Examination-Activity Limitations  Dressing;Hygiene/Grooming;Toileting;Reach Overhead;Carry;Lift    Stability/Clinical Decision Making  Stable/Uncomplicated    Clinical Decision Making  Low    Rehab Potential  Good    PT Frequency  3x / week    PT Duration  8 weeks    PT Treatment/Interventions  ADLs/Self Care Home Management;Cryotherapy;Electrical Stimulation;Moist Heat;Iontophoresis 4mg /ml Dexamethasone;Neuromuscular re-education;Manual techniques;Passive range of motion;Therapeutic exercise;Therapeutic activities;Functional mobility training;Patient/family education;Vasopneumatic Device    PT Next Visit Plan  PROM to right shoulder, begin pulleys and seated ranger, modalities  PRN for pain relief.    PT  Home Exercise Plan  see patient education section    Consulted and Agree with Plan of Care  Patient       Patient will benefit from skilled therapeutic intervention in order to improve the following deficits and impairments:  Decreased activity tolerance, Decreased range of motion, Decreased strength, Postural dysfunction, Pain, Impaired UE functional use, Decreased scar mobility  Visit Diagnosis: Acute pain of right shoulder  Stiffness of right shoulder, not elsewhere classified     Problem List Patient Active Problem List   Diagnosis Date Noted  . Glenoid fracture of shoulder 01/25/2020  . Diabetes mellitus ([place]) 12/09/2019  . COVID-19 12/01/2019  . H/O: hysterectomy 10/23/2018  . History of C-section 10/23/2018  . T2DM (type 2 diabetes mellitus) ([place]) 09/20/2017  . Morbid obesity with BMI of 40.0-44.9, adult ([place]) 02/13/2017  . Hypertension associated with type 2 diabetes mellitus ([place]) 02/14/2015    Gabriela Eves, PT, DPT 03/02/2020, 6:19 PM  [facility] [address] [place], [place], [address] Phone: [phone]   Fax:  [phone]  Name: Brandy Hernandez MRN: [id number] Date of Birth: Jan 20, 1966

## 2020-03-04 ENCOUNTER — Other Ambulatory Visit: Payer: Self-pay

## 2020-03-04 ENCOUNTER — Encounter: Payer: Self-pay | Admitting: Physical Therapy

## 2020-03-04 ENCOUNTER — Ambulatory Visit: Payer: BC Managed Care – PPO | Admitting: Physical Therapy

## 2020-03-04 DIAGNOSIS — M25611 Stiffness of right shoulder, not elsewhere classified: Secondary | ICD-10-CM

## 2020-03-04 DIAGNOSIS — M25511 Pain in right shoulder: Secondary | ICD-10-CM

## 2020-03-04 NOTE — Therapy (Signed)
[facility] [place], [place], [address] Phone: [phone]   Fax:  [phone]  Physical Therapy Treatment  Patient Details  Name: Brandy Hernandez MRN: [id number] Date of Birth: September 22, 1966 Referring Provider (PT): Ophelia Charter, MD   Encounter Date: 03/04/2020  PT End of Session - 03/04/20 0825    Visit Number  14    Number of Visits  24    Date for PT Re-Evaluation  04/06/20    Authorization Type  FOTO (12th visit: 60% limitation); Progress note every 10th visit    PT Start Time  0731    PT Stop Time  0824    PT Time Calculation (min)  53 min    Activity Tolerance  Patient tolerated treatment well    Behavior During Therapy  [facility] for tasks assessed/performed       Past Medical History:  Diagnosis Date  . Basal cell carcinoma   . COVID-19   . Diabetes mellitus without complication ([place])   . Hypertension     Past Surgical History:  Procedure Laterality Date  . ABDOMINAL HYSTERECTOMY    . CESAREAN SECTION    . COLONOSCOPY N/A 01/19/2019   Procedure: COLONOSCOPY;  Surgeon: Danie Binder, MD;  [facility];  Service: Endoscopy;  Laterality: N/A;  9:30  . ENDOMETRIAL ABLATION    . POLYPECTOMY  01/19/2019   Procedure: POLYPECTOMY;  Surgeon: Danie Binder, MD;  [facility];  Service: Endoscopy;;  transverse colon   . REVERSE SHOULDER ARTHROPLASTY Right 01/25/2020   Procedure: TOTAL SHOULDER ARTHROPLASTY;  Surgeon: Hiram Gash, MD;  [facility]: [facility];  Service: Orthopedics;  Laterality: Right;  . SKIN CANCER EXCISION  2018    There were no vitals filed for this visit.  Subjective Assessment - 03/04/20 0739    Subjective  COVID-19 screening performed upon arrival. Patient feeling more sore today; unsure if its the combination of last visit's PT session or work activities    Pertinent History  R TSA, 01/25/2020, HTN, DM    Limitations  Lifting;House hold activities    Diagnostic tests  x-ray    Patient Stated Goals  "return to all activities"    Currently in Pain?  Yes    Pain Score  4     Pain Location  Shoulder    Pain Orientation  Right    Pain Descriptors / Indicators  Sore    Pain Type  Surgical pain    Pain Onset  1 to 4 weeks ago    Pain Frequency  Constant         [facility] PT Assessment - 03/04/20 0001      Assessment   Medical Diagnosis  Right anatomic stemless total shoulder arthroplasty    Referring Provider (PT)  Ophelia Charter, MD    Onset Date/Surgical Date  01/25/20    Hand Dominance  Right    Next MD Visit  04/19/2020    Prior Therapy  no      Precautions   Precautions  Shoulder    Type of Shoulder Precautions  Follow Dr. Rich Fuchs TSA protocol                   [facility] Adult PT Treatment/Exercise - 03/04/20 0001      Exercises   Exercises  Shoulder      Shoulder Exercises: Pulleys   Flexion  5 minutes      Shoulder Exercises: ROM/Strengthening   Ranger  Seated ranger flexion, CW & CCW circles x3 minutes each      Modalities   Modalities  Electrical Stimulation;Vasopneumatic      Electrical Stimulation   Electrical Stimulation Location  right shoulder    Electrical Stimulation Action  IFC    Electrical Stimulation Parameters  80-150 hz x10    Electrical Stimulation Goals  Pain      Vasopneumatic   Number Minutes Vasopneumatic   10 minutes    Vasopnuematic Location   Shoulder    Vasopneumatic Pressure  Low    Vasopneumatic Temperature   34      Manual Therapy   Manual Therapy  Passive ROM    Passive ROM  PROM of R shoulder into flex, ER, ABD with gentle holds at end range with intermittent oscillations for muscle relaxation                  PT Long Term Goals - 02/10/20 0746      PT LONG TERM GOAL #1   Title  Patient will be independent with HEP    Time  8    Period  Weeks    Status  On-going      PT LONG TERM GOAL #2   Title  Patient will report ability to perform ADLs with right shoulder pain less than or  equal to 3/10.    Time  8    Period  Weeks    Status  On-going   not able to perfrom ADL's with UE at this time 02/10/20     PT LONG TERM GOAL #3   Title  Patient will demonstrate 55+ degrees of right shoulder ER AROM to improve donning and doffing apparel.    Time  8    Period  Weeks    Status  On-going      PT LONG TERM GOAL #4   Title  Patient will demonstrate 140+ degrees of right shoulder flexion AROM to improve overhead activities.    Time  8    Period  Weeks    Status  On-going      PT LONG TERM GOAL #5   Title  Patient will demonstrate 4/5 or greater of right shoulder MMT in all planes to improve stability during functional tasks.    Time  8    Period  Weeks    Status  On-going            Plan - 03/04/20 0813    Clinical Impression Statement  Patient responded well to therapy despite increase of right shoulder soreness. Patient utilized left UE for support during UE ranger AAROM exercises but overall did well. Patient with smooth arcs of motion with minimal guarding during right shoulder PROM.    Personal Factors and Comorbidities  Comorbidity 2    Comorbidities  right TSA 01/25/2020, HTN, DM    Examination-Activity Limitations  Dressing;Hygiene/Grooming;Toileting;Reach Overhead;Carry;Lift    Stability/Clinical Decision Making  Stable/Uncomplicated    Clinical Decision Making  Low    Rehab Potential  Good    PT Frequency  3x / week    PT Duration  8 weeks    PT Treatment/Interventions  ADLs/Self Care Home Management;Cryotherapy;Electrical Stimulation;Moist Heat;Iontophoresis 4mg /ml Dexamethasone;Neuromuscular re-education;Manual techniques;Passive range of motion;Therapeutic exercise;Therapeutic activities;Functional mobility training;Patient/family education;Vasopneumatic Device    PT Next Visit Plan  PROM to right shoulder, begin pulleys and seated ranger, modalities PRN for pain relief.    PT Home Exercise Plan  see patient education section  Consulted and Agree  with Plan of Care  Patient       Patient will benefit from skilled therapeutic intervention in order to improve the following deficits and impairments:  Decreased activity tolerance, Decreased range of motion, Decreased strength, Postural dysfunction, Pain, Impaired UE functional use, Decreased scar mobility  Visit Diagnosis: Acute pain of right shoulder  Stiffness of right shoulder, not elsewhere classified     Problem List Patient Active Problem List   Diagnosis Date Noted  . Glenoid fracture of shoulder 01/25/2020  . Diabetes mellitus ([place]) 12/09/2019  . COVID-19 12/01/2019  . H/O: hysterectomy 10/23/2018  . History of C-section 10/23/2018  . T2DM (type 2 diabetes mellitus) ([place]) 09/20/2017  . Morbid obesity with BMI of 40.0-44.9, adult ([place]) 02/13/2017  . Hypertension associated with type 2 diabetes mellitus ([place]) 02/14/2015    Gabriela Eves, PT, DPT 03/04/2020, 8:30 AM  [facility] [address] [place], [place], [address] Phone: [phone]   Fax:  [phone]  Name: Brandy Hernandez MRN: [id number] Date of Birth: [birth year]

## 2020-03-07 ENCOUNTER — Ambulatory Visit: Payer: BC Managed Care – PPO | Attending: Orthopaedic Surgery | Admitting: Physical Therapy

## 2020-03-07 ENCOUNTER — Encounter: Payer: Self-pay | Admitting: Physical Therapy

## 2020-03-07 ENCOUNTER — Other Ambulatory Visit: Payer: Self-pay

## 2020-03-07 DIAGNOSIS — M25511 Pain in right shoulder: Secondary | ICD-10-CM | POA: Diagnosis present

## 2020-03-07 DIAGNOSIS — M25611 Stiffness of right shoulder, not elsewhere classified: Secondary | ICD-10-CM | POA: Insufficient documentation

## 2020-03-07 NOTE — Therapy (Signed)
[facility] [place], [place], [address] Phone: [phone]   Fax:  [phone]  Physical Therapy Treatment  Patient Details  Name: Brandy Hernandez MRN: [id number] Date of Birth: 01-26-1966 Referring Provider (PT): Ophelia Charter, MD   Encounter Date: 03/07/2020  PT End of Session - 03/07/20 [id number]    Visit Number  15    Number of Visits  24    Date for PT Re-Evaluation  04/06/20    Authorization Type  FOTO (12th visit: 60% limitation); Progress note every 10th visit    PT Start Time  0730    PT Stop Time  0826    PT Time Calculation (min)  56 min    Activity Tolerance  Patient tolerated treatment well    Behavior During Therapy  [facility] for tasks assessed/performed       Past Medical History:  Diagnosis Date  . Basal cell carcinoma   . COVID-19   . Diabetes mellitus without complication ([place])   . Hypertension     Past Surgical History:  Procedure Laterality Date  . ABDOMINAL HYSTERECTOMY    . CESAREAN SECTION    . COLONOSCOPY N/A 01/19/2019   Procedure: COLONOSCOPY;  Surgeon: Danie Binder, MD;  [facility];  Service: Endoscopy;  Laterality: N/A;  9:30  . ENDOMETRIAL ABLATION    . POLYPECTOMY  01/19/2019   Procedure: POLYPECTOMY;  Surgeon: Danie Binder, MD;  [facility];  Service: Endoscopy;;  transverse colon   . REVERSE SHOULDER ARTHROPLASTY Right 01/25/2020   Procedure: TOTAL SHOULDER ARTHROPLASTY;  Surgeon: Hiram Gash, MD;  [facility];  Service: Orthopedics;  Laterality: Right;  . SKIN CANCER EXCISION  2018    There were no vitals filed for this visit.  Subjective Assessment - 03/07/20 0734    Subjective  COVID-19 screening performed upon arrival. Patient reported having more pain over the weekend and reports it must be from the weather.    Pertinent History  R TSA, 01/25/2020, HTN, DM    Limitations  Lifting;House hold activities    Diagnostic tests  x-ray    Patient Stated  Goals  "return to all activities"    Pain Score  6     Pain Location  Shoulder    Pain Onset  1 to 4 weeks ago    Pain Frequency  Constant         [facility] PT Assessment - 03/07/20 0001      Assessment   Medical Diagnosis  Right anatomic stemless total shoulder arthroplasty    Referring Provider (PT)  Ophelia Charter, MD    Onset Date/Surgical Date  01/25/20    Hand Dominance  Right    Next MD Visit  04/19/2020    Prior Therapy  no      Precautions   Precautions  Shoulder    Type of Shoulder Precautions  Follow Dr. Rich Fuchs TSA protocol                   [facility] Adult PT Treatment/Exercise - 03/07/20 0001      Exercises   Exercises  Shoulder      Shoulder Exercises: Supine   Protraction  AAROM;10 reps;Both;20 reps    External Rotation  AAROM;Right;10 reps;20 reps    Flexion  AAROM;Both;10 reps;20 reps      Shoulder Exercises: Pulleys   Flexion  5 minutes      Shoulder Exercises: ROM/Strengthening   Ranger  Seated  ranger flexion, CW & CCW circles x3 minutes each    Other ROM/Strengthening Exercises  wall ladder x10 level 23      Modalities   Modalities  Electrical Stimulation;Vasopneumatic      Electrical Stimulation   Electrical Stimulation Location  right shoulder    Electrical Stimulation Action  IFC    Electrical Stimulation Parameters  80-150 hz x15 mins    Electrical Stimulation Goals  Pain      Vasopneumatic   Number Minutes Vasopneumatic   15 minutes    Vasopnuematic Location   Shoulder    Vasopneumatic Pressure  Low    Vasopneumatic Temperature   34      Manual Therapy   Manual Therapy  Soft tissue mobilization    Soft tissue mobilization  STW/M to right shoulder and scar massage to decrease adhesions                  PT Long Term Goals - 02/10/20 0746      PT LONG TERM GOAL #1   Title  Patient will be independent with HEP    Time  8    Period  Weeks    Status  On-going      PT LONG TERM GOAL #2   Title  Patient will report  ability to perform ADLs with right shoulder pain less than or equal to 3/10.    Time  8    Period  Weeks    Status  On-going   not able to perfrom ADL's with UE at this time 02/10/20     PT LONG TERM GOAL #3   Title  Patient will demonstrate 55+ degrees of right shoulder ER AROM to improve donning and doffing apparel.    Time  8    Period  Weeks    Status  On-going      PT LONG TERM GOAL #4   Title  Patient will demonstrate 140+ degrees of right shoulder flexion AROM to improve overhead activities.    Time  8    Period  Weeks    Status  On-going      PT LONG TERM GOAL #5   Title  Patient will demonstrate 4/5 or greater of right shoulder MMT in all planes to improve stability during functional tasks.    Time  8    Period  Weeks    Status  On-going            Plan - 03/07/20 [id number]    Clinical Impression Statement  Patient responded fairly to session today with increasing pain in right shoulder. Patient's session focused on AAROM with therapeutic rest breaks to decrease pain. Ongoing UT compensatory motions at start of movement but improved after verbal and tactile cuing. Patient with normal response to modalities upon removal.    Personal Factors and Comorbidities  Comorbidity 2    Comorbidities  right TSA 01/25/2020, HTN, DM    Examination-Activity Limitations  Dressing;Hygiene/Grooming;Toileting;Reach Overhead;Carry;Lift    Stability/Clinical Decision Making  Stable/Uncomplicated    Clinical Decision Making  Low    Rehab Potential  Good    PT Frequency  3x / week    PT Duration  8 weeks    PT Treatment/Interventions  ADLs/Self Care Home Management;Cryotherapy;Electrical Stimulation;Moist Heat;Iontophoresis 4mg /ml Dexamethasone;Neuromuscular re-education;Manual techniques;Passive range of motion;Therapeutic exercise;Therapeutic activities;Functional mobility training;Patient/family education;Vasopneumatic Device    PT Next Visit Plan  PROM to right shoulder, begin pulleys and  seated ranger, modalities PRN for pain relief.  PT Home Exercise Plan  see patient education section    Consulted and Agree with Plan of Care  Patient       Patient will benefit from skilled therapeutic intervention in order to improve the following deficits and impairments:  Decreased activity tolerance, Decreased range of motion, Decreased strength, Postural dysfunction, Pain, Impaired UE functional use, Decreased scar mobility  Visit Diagnosis: Acute pain of right shoulder  Stiffness of right shoulder, not elsewhere classified     Problem List Patient Active Problem List   Diagnosis Date Noted  . Glenoid fracture of shoulder 01/25/2020  . Diabetes mellitus ([place]) 12/09/2019  . COVID-19 12/01/2019  . H/O: hysterectomy 10/23/2018  . History of C-section 10/23/2018  . T2DM (type 2 diabetes mellitus) ([place]) 09/20/2017  . Morbid obesity with BMI of 40.0-44.9, adult ([place]) 02/13/2017  . Hypertension associated with type 2 diabetes mellitus ([place]) 02/14/2015    Gabriela Eves, PT, DPT 03/07/2020, 9:15 AM  [facility] [address] [place], [place], [address] Phone: [phone]   Fax:  [phone]  Name: Brandy Hernandez MRN: [id number] Date of Birth: 04-Mar-1966

## 2020-03-09 ENCOUNTER — Ambulatory Visit: Payer: BC Managed Care – PPO | Admitting: Physical Therapy

## 2020-03-09 ENCOUNTER — Other Ambulatory Visit: Payer: Self-pay

## 2020-03-09 ENCOUNTER — Encounter: Payer: Self-pay | Admitting: Physical Therapy

## 2020-03-09 DIAGNOSIS — M25511 Pain in right shoulder: Secondary | ICD-10-CM

## 2020-03-09 DIAGNOSIS — M25611 Stiffness of right shoulder, not elsewhere classified: Secondary | ICD-10-CM

## 2020-03-09 NOTE — Therapy (Signed)
[facility] [place], [place], [address] Phone: [phone]   Fax:  [phone]  Physical Therapy Treatment  Patient Details  Name: Brandy Hernandez MRN: [id number] Date of Birth: 10-16-1966 Referring Provider (PT): Ophelia Charter, MD   Encounter Date: 03/09/2020  PT End of Session - 03/09/20 0827    Visit Number  16    Number of Visits  24    Date for PT Re-Evaluation  04/06/20    Authorization Type  FOTO (12th visit: 60% limitation); Progress note every 10th visit    PT Start Time  0731    PT Stop Time  0827    PT Time Calculation (min)  56 min    Activity Tolerance  Patient tolerated treatment well    Behavior During Therapy  [facility] for tasks assessed/performed       Past Medical History:  Diagnosis Date  . Basal cell carcinoma   . COVID-19   . Diabetes mellitus without complication ([place])   . Hypertension     Past Surgical History:  Procedure Laterality Date  . ABDOMINAL HYSTERECTOMY    . CESAREAN SECTION    . COLONOSCOPY N/A 01/19/2019   Procedure: COLONOSCOPY;  Surgeon: Danie Binder, MD;  [facility];  Service: Endoscopy;  Laterality: N/A;  9:30  . ENDOMETRIAL ABLATION    . POLYPECTOMY  01/19/2019   Procedure: POLYPECTOMY;  Surgeon: Danie Binder, MD;  [facility];  Service: Endoscopy;;  transverse colon   . REVERSE SHOULDER ARTHROPLASTY Right 01/25/2020   Procedure: TOTAL SHOULDER ARTHROPLASTY;  Surgeon: Hiram Gash, MD;  [facility];  Service: Orthopedics;  Laterality: Right;  . SKIN CANCER EXCISION  2018    There were no vitals filed for this visit.  Subjective Assessment - 03/09/20 0822    Subjective  COVID-19 screening performed upon arrival. Patient reported feeling more sore today. Reports getting her second COVID shot yesterday so that may be contributing to more soreness.    Pertinent History  R TSA, 01/25/2020, HTN, DM    Limitations  Lifting;House hold activities    Diagnostic tests  x-ray    Patient Stated Goals  "return to all activities"    Currently in Pain?  Yes   did not provide number on pain scale        [facility] PT Assessment - 03/09/20 0001      Assessment   Medical Diagnosis  Right anatomic stemless total shoulder arthroplasty    Referring Provider (PT)  Ophelia Charter, MD    Onset Date/Surgical Date  01/25/20    Hand Dominance  Right    Next MD Visit  04/19/2020    Prior Therapy  no      Precautions   Precautions  Shoulder    Type of Shoulder Precautions  Follow Dr. Rich Fuchs TSA protocol                   [facility] Adult PT Treatment/Exercise - 03/09/20 0001      Exercises   Exercises  Shoulder      Shoulder Exercises: Pulleys   Flexion  5 minutes      Shoulder Exercises: ROM/Strengthening   Wall Wash  circles CW, CCW, and flexion x3 mins each      Modalities   Modalities  Electrical Stimulation;Vasopneumatic      Electrical Stimulation   Electrical Stimulation Location  right shoulder    Electrical Stimulation Action  IFC  Electrical Stimulation Parameters  80-150 hz x10 mins    Electrical Stimulation Goals  Pain      Vasopneumatic   Number Minutes Vasopneumatic   10 minutes    Vasopnuematic Location   Shoulder    Vasopneumatic Pressure  Low    Vasopneumatic Temperature   34      Manual Therapy   Manual Therapy  Soft tissue mobilization    Soft tissue mobilization  IASTM to right shoulder scar to decrease adhesions    Passive ROM  PROM of R shoulder into flex, ER, with gentle holds at end range with intermittent oscillations for muscle relaxation                  PT Long Term Goals - 02/10/20 0746      PT LONG TERM GOAL #1   Title  Patient will be independent with HEP    Time  8    Period  Weeks    Status  On-going      PT LONG TERM GOAL #2   Title  Patient will report ability to perform ADLs with right shoulder pain less than or equal to 3/10.    Time  8    Period  Weeks    Status   On-going   not able to perfrom ADL's with UE at this time 02/10/20     PT LONG TERM GOAL #3   Title  Patient will demonstrate 55+ degrees of right shoulder ER AROM to improve donning and doffing apparel.    Time  8    Period  Weeks    Status  On-going      PT LONG TERM GOAL #4   Title  Patient will demonstrate 140+ degrees of right shoulder flexion AROM to improve overhead activities.    Time  8    Period  Weeks    Status  On-going      PT LONG TERM GOAL #5   Title  Patient will demonstrate 4/5 or greater of right shoulder MMT in all planes to improve stability during functional tasks.    Time  8    Period  Weeks    Status  On-going            Plan - 03/09/20 0827    Clinical Impression Statement  Patient responded fairly well to therapy session. Patient still with ongoing discomfort with AAROM flexion wall slides requiring intermittent rest breaks. IASTM performed to scar tissue for mobilization with good response. Ongoing discomfort with end range PROM but with less discomfort with intermittent oscillations. No adverse affects upon removal of modalities.    Personal Factors and Comorbidities  Comorbidity 2    Comorbidities  right TSA 01/25/2020, HTN, DM    Examination-Activity Limitations  Dressing;Hygiene/Grooming;Toileting;Reach Overhead;Carry;Lift    Stability/Clinical Decision Making  Stable/Uncomplicated    Clinical Decision Making  Low    Rehab Potential  Good    PT Frequency  3x / week    PT Duration  8 weeks    PT Treatment/Interventions  ADLs/Self Care Home Management;Cryotherapy;Electrical Stimulation;Moist Heat;Iontophoresis 4mg /ml Dexamethasone;Neuromuscular re-education;Manual techniques;Passive range of motion;Therapeutic exercise;Therapeutic activities;Functional mobility training;Patient/family education;Vasopneumatic Device    PT Next Visit Plan  PROM to right shoulder, begin pulleys and seated ranger, modalities PRN for pain relief.    PT Home Exercise Plan   see patient education section    Consulted and Agree with Plan of Care  Patient       Patient will benefit  from skilled therapeutic intervention in order to improve the following deficits and impairments:  Decreased activity tolerance, Decreased range of motion, Decreased strength, Postural dysfunction, Pain, Impaired UE functional use, Decreased scar mobility  Visit Diagnosis: Acute pain of right shoulder  Stiffness of right shoulder, not elsewhere classified     Problem List Patient Active Problem List   Diagnosis Date Noted  . Glenoid fracture of shoulder 01/25/2020  . Diabetes mellitus ([place]) 12/09/2019  . COVID-19 12/01/2019  . H/O: hysterectomy 10/23/2018  . History of C-section 10/23/2018  . T2DM (type 2 diabetes mellitus) ([place]) 09/20/2017  . Morbid obesity with BMI of 40.0-44.9, adult ([place]) 02/13/2017  . Hypertension associated with type 2 diabetes mellitus ([place]) 02/14/2015    Gabriela Eves, PT, DPT 03/09/2020, 9:23 AM  [facility] [address] [place], [place], [address] Phone: [phone]   Fax:  [phone]  Name: Brandy Hernandez MRN: [id number] Date of Birth: [birth year]

## 2020-03-11 ENCOUNTER — Ambulatory Visit: Payer: BC Managed Care – PPO | Admitting: Physical Therapy

## 2020-03-11 ENCOUNTER — Other Ambulatory Visit: Payer: Self-pay

## 2020-03-11 DIAGNOSIS — M25511 Pain in right shoulder: Secondary | ICD-10-CM | POA: Diagnosis not present

## 2020-03-11 DIAGNOSIS — M25611 Stiffness of right shoulder, not elsewhere classified: Secondary | ICD-10-CM

## 2020-03-11 NOTE — Therapy (Signed)
[facility] [place], [place], [address] Phone: [phone]   Fax:  [phone]  Physical Therapy Treatment  Patient Details  Name: Brandy Hernandez MRN: [id number] Date of Birth: 29-Mar-1966 Referring Provider (PT): Ophelia Charter, MD   Encounter Date: 03/11/2020  PT End of Session - 03/11/20 0813    Visit Number  17    Number of Visits  24    Date for PT Re-Evaluation  04/06/20    Authorization Type  FOTO (12th visit: 60% limitation); Progress note every 10th visit    PT Start Time  0730    PT Stop Time  0822    PT Time Calculation (min)  52 min    Activity Tolerance  Patient tolerated treatment well    Behavior During Therapy  [facility] for tasks assessed/performed       Past Medical History:  Diagnosis Date  . Basal cell carcinoma   . COVID-19   . Diabetes mellitus without complication ([place])   . Hypertension     Past Surgical History:  Procedure Laterality Date  . ABDOMINAL HYSTERECTOMY    . CESAREAN SECTION    . COLONOSCOPY N/A 01/19/2019   Procedure: COLONOSCOPY;  Surgeon: Danie Binder, MD;  [facility];  Service: Endoscopy;  Laterality: N/A;  9:30  . ENDOMETRIAL ABLATION    . POLYPECTOMY  01/19/2019   Procedure: POLYPECTOMY;  Surgeon: Danie Binder, MD;  [facility];  Service: Endoscopy;;  transverse colon   . REVERSE SHOULDER ARTHROPLASTY Right 01/25/2020   Procedure: TOTAL SHOULDER ARTHROPLASTY;  Surgeon: Hiram Gash, MD;  [facility]: [facility];  Service: Orthopedics;  Laterality: Right;  . SKIN CANCER EXCISION  2018    There were no vitals filed for this visit.  Subjective Assessment - 03/11/20 0812    Subjective  COVID-19 screening performed upon arrival. Patient arrives with 3/10 soreness reaches a 5/10 with movement.    Pertinent History  R TSA, 01/25/2020, HTN, DM    Limitations  Lifting;House hold activities    Diagnostic tests  x-ray    Patient Stated Goals  "return to all  activities"    Currently in Pain?  Yes    Pain Score  3     Pain Location  Shoulder    Pain Orientation  Right    Pain Descriptors / Indicators  Sore    Pain Type  Surgical pain    Pain Onset  1 to 4 weeks ago    Pain Frequency  Constant         [facility] PT Assessment - 03/11/20 0001      Assessment   Medical Diagnosis  Right anatomic stemless total shoulder arthroplasty    Referring Provider (PT)  Ophelia Charter, MD    Onset Date/Surgical Date  01/25/20    Hand Dominance  Right    Next MD Visit  04/19/2020    Prior Therapy  no      Precautions   Precautions  Shoulder    Type of Shoulder Precautions  Follow Dr. Rich Fuchs TSA protocol                   [facility] Adult PT Treatment/Exercise - 03/11/20 0001      Exercises   Exercises  Shoulder      Shoulder Exercises: Pulleys   Flexion  5 minutes      Shoulder Exercises: ROM/Strengthening   Ranger  Seated ranger flexion, CW & CCW circles  x3 minutes each      Modalities   Modalities  Psychologist, educational Location  right shoulder    Electrical Stimulation Action  IFC    Electrical Stimulation Parameters  80-150 hz x10    Electrical Stimulation Goals  Pain      Vasopneumatic   Number Minutes Vasopneumatic   10 minutes    Vasopnuematic Location   Shoulder    Vasopneumatic Pressure  Low    Vasopneumatic Temperature   34      Manual Therapy   Manual Therapy  Soft tissue mobilization    Passive ROM  PROM of R shoulder into flex, ER, and abd with gentle holds at end range with intermittent oscillations for muscle relaxation                  PT Long Term Goals - 02/10/20 0746      PT LONG TERM GOAL #1   Title  Patient will be independent with HEP    Time  8    Period  Weeks    Status  On-going      PT LONG TERM GOAL #2   Title  Patient will report ability to perform ADLs with right shoulder pain less than or equal to 3/10.    Time   8    Period  Weeks    Status  On-going   not able to perfrom ADL's with UE at this time 02/10/20     PT LONG TERM GOAL #3   Title  Patient will demonstrate 55+ degrees of right shoulder ER AROM to improve donning and doffing apparel.    Time  8    Period  Weeks    Status  On-going      PT LONG TERM GOAL #4   Title  Patient will demonstrate 140+ degrees of right shoulder flexion AROM to improve overhead activities.    Time  8    Period  Weeks    Status  On-going      PT LONG TERM GOAL #5   Title  Patient will demonstrate 4/5 or greater of right shoulder MMT in all planes to improve stability during functional tasks.    Time  8    Period  Weeks    Status  On-going            Plan - 03/11/20 0817    Clinical Impression Statement  Patient resonded fairly well but with increased pain with end ranges with AAROM. Patient with decreased UT compensation throughout AAROM. Firm end feels through PROM. Oscillations required for pain relief. Patient instructed to use biofreeze as needed and to apply after ice. Patient reported understanding.  No adverse affects upon removal.    Personal Factors and Comorbidities  Comorbidity 2    Comorbidities  right TSA 01/25/2020, HTN, DM    Examination-Activity Limitations  Dressing;Hygiene/Grooming;Toileting;Reach Overhead;Carry;Lift    Stability/Clinical Decision Making  Stable/Uncomplicated    Clinical Decision Making  Low    Rehab Potential  Good    PT Frequency  3x / week    PT Duration  8 weeks    PT Treatment/Interventions  ADLs/Self Care Home Management;Cryotherapy;Electrical Stimulation;Moist Heat;Iontophoresis 4mg /ml Dexamethasone;Neuromuscular re-education;Manual techniques;Passive range of motion;Therapeutic exercise;Therapeutic activities;Functional mobility training;Patient/family education;Vasopneumatic Device    PT Next Visit Plan  PROM to right shoulder, continue with pulleys, seated ranger, modalities PRN for pain relief.    PT Home  Exercise  Plan  see patient education section    Consulted and Agree with Plan of Care  Patient       Patient will benefit from skilled therapeutic intervention in order to improve the following deficits and impairments:  Decreased activity tolerance, Decreased range of motion, Decreased strength, Postural dysfunction, Pain, Impaired UE functional use, Decreased scar mobility  Visit Diagnosis: Acute pain of right shoulder  Stiffness of right shoulder, not elsewhere classified     Problem List Patient Active Problem List   Diagnosis Date Noted  . Glenoid fracture of shoulder 01/25/2020  . Diabetes mellitus ([place]) 12/09/2019  . COVID-19 12/01/2019  . H/O: hysterectomy 10/23/2018  . History of C-section 10/23/2018  . T2DM (type 2 diabetes mellitus) ([place]) 09/20/2017  . Morbid obesity with BMI of 40.0-44.9, adult ([place]) 02/13/2017  . Hypertension associated with type 2 diabetes mellitus ([place]) 02/14/2015    Gabriela Eves, PT, DPT 03/11/2020, 10:13 AM  [facility] [address] [place], [place], [address] Phone: [phone]   Fax:  [phone]  Name: Brandy Hernandez MRN: [id number] Date of Birth: [birth year]

## 2020-03-14 ENCOUNTER — Other Ambulatory Visit: Payer: Self-pay

## 2020-03-14 ENCOUNTER — Ambulatory Visit: Payer: BC Managed Care – PPO | Admitting: Physical Therapy

## 2020-03-14 ENCOUNTER — Encounter: Payer: Self-pay | Admitting: Physical Therapy

## 2020-03-14 DIAGNOSIS — M25511 Pain in right shoulder: Secondary | ICD-10-CM

## 2020-03-14 DIAGNOSIS — M25611 Stiffness of right shoulder, not elsewhere classified: Secondary | ICD-10-CM

## 2020-03-14 NOTE — Therapy (Signed)
[facility] [place], [place], [address] Phone: [phone]   Fax:  [phone]  Physical Therapy Treatment  Patient Details  Name: KAMAYA CARLEO MRN: [id number] Date of Birth: [birth year] Referring Provider (PT): Ophelia Charter, MD   Encounter Date: 03/14/2020  PT End of Session - 03/14/20 0816    Visit Number  18    Number of Visits  24    Date for PT Re-Evaluation  04/06/20    Authorization Type  FOTO (12th visit: 60% limitation); Progress note every 10th visit    PT Start Time  0731    PT Stop Time  0821    PT Time Calculation (min)  50 min    Activity Tolerance  Patient tolerated treatment well    Behavior During Therapy  [facility] for tasks assessed/performed       Past Medical History:  Diagnosis Date  . Basal cell carcinoma   . COVID-19   . Diabetes mellitus without complication ([place])   . Hypertension     Past Surgical History:  Procedure Laterality Date  . ABDOMINAL HYSTERECTOMY    . CESAREAN SECTION    . COLONOSCOPY N/A 01/19/2019   Procedure: COLONOSCOPY;  Surgeon: Danie Binder, MD;  [facility];  Service: Endoscopy;  Laterality: N/A;  9:30  . ENDOMETRIAL ABLATION    . POLYPECTOMY  01/19/2019   Procedure: POLYPECTOMY;  Surgeon: Danie Binder, MD;  [facility];  Service: Endoscopy;;  transverse colon   . REVERSE SHOULDER ARTHROPLASTY Right 01/25/2020   Procedure: TOTAL SHOULDER ARTHROPLASTY;  Surgeon: Hiram Gash, MD;  [facility];  Service: Orthopedics;  Laterality: Right;  . SKIN CANCER EXCISION  2018    There were no vitals filed for this visit.  Subjective Assessment - 03/14/20 0745    Subjective  COVID-19 screening performed upon arrival. Patient reports playing one song on keyboard yesterday. Reports feeling tired after playing to keep her arm up but completed it.    Pertinent History  R TSA, 01/25/2020, HTN, DM    Limitations  Lifting;House hold activities    Diagnostic tests  x-ray    Patient Stated Goals  "return to all activities"    Currently in Pain?  Yes    Pain Score  3     Pain Location  Shoulder    Pain Orientation  Right    Pain Descriptors / Indicators  Sore    Pain Type  Surgical pain    Pain Onset  1 to 4 weeks ago    Pain Frequency  Constant         [facility] PT Assessment - 03/14/20 0001      Assessment   Medical Diagnosis  Right anatomic stemless total shoulder arthroplasty    Referring Provider (PT)  Ophelia Charter, MD    Onset Date/Surgical Date  01/25/20    Hand Dominance  Right    Next MD Visit  04/19/2020    Prior Therapy  no      Precautions   Precautions  Shoulder    Type of Shoulder Precautions  Follow Dr. Rich Fuchs TSA protocol                   [facility] Adult PT Treatment/Exercise - 03/14/20 0001      Exercises   Exercises  Shoulder      Shoulder Exercises: Pulleys   Flexion  5 minutes      Shoulder Exercises: ROM/Strengthening  Ranger  Seated ranger flexion, CW & CCW circles x3 minutes each    Other ROM/Strengthening Exercises  wall ladder x10 level 23      Modalities   Modalities  Electrical Stimulation;Vasopneumatic      Electrical Stimulation   Electrical Stimulation Location  right shoulder    Electrical Stimulation Action  IFC    Electrical Stimulation Parameters  80-150 hz x10 mins    Electrical Stimulation Goals  Pain      Vasopneumatic   Number Minutes Vasopneumatic   10 minutes    Vasopnuematic Location   Shoulder    Vasopneumatic Pressure  Low    Vasopneumatic Temperature   34      Manual Therapy   Manual Therapy  Passive ROM    Passive ROM  PROM of R shoulder into flex, ER, and abd with gentle holds at end range with intermittent oscillations for muscle relaxation                  PT Long Term Goals - 02/10/20 0746      PT LONG TERM GOAL #1   Title  Patient will be independent with HEP    Time  8    Period  Weeks    Status  On-going      PT LONG TERM  GOAL #2   Title  Patient will report ability to perform ADLs with right shoulder pain less than or equal to 3/10.    Time  8    Period  Weeks    Status  On-going   not able to perfrom ADL's with UE at this time 02/10/20     PT LONG TERM GOAL #3   Title  Patient will demonstrate 55+ degrees of right shoulder ER AROM to improve donning and doffing apparel.    Time  8    Period  Weeks    Status  On-going      PT LONG TERM GOAL #4   Title  Patient will demonstrate 140+ degrees of right shoulder flexion AROM to improve overhead activities.    Time  8    Period  Weeks    Status  On-going      PT LONG TERM GOAL #5   Title  Patient will demonstrate 4/5 or greater of right shoulder MMT in all planes to improve stability during functional tasks.    Time  8    Period  Weeks    Status  On-going            Plan - 03/14/20 0813    Clinical Impression Statement  Patient responded fairly but with increased muscle guarding with end range PROM particularly into right shoulder flexion. Intermittent oscillations and gentle STW/M to right shoulder performed to decrease pain and decrease guarding. Patient noted with normal response to modalities upon removal.    Personal Factors and Comorbidities  Comorbidity 2    Comorbidities  right TSA 01/25/2020, HTN, DM    Examination-Activity Limitations  Dressing;Hygiene/Grooming;Toileting;Reach Overhead;Carry;Lift    Stability/Clinical Decision Making  Stable/Uncomplicated    Clinical Decision Making  Low    Rehab Potential  Good    PT Frequency  3x / week    PT Duration  8 weeks    PT Treatment/Interventions  ADLs/Self Care Home Management;Cryotherapy;Electrical Stimulation;Moist Heat;Iontophoresis 4mg /ml Dexamethasone;Neuromuscular re-education;Manual techniques;Passive range of motion;Therapeutic exercise;Therapeutic activities;Functional mobility training;Patient/family education;Vasopneumatic Device    PT Next Visit Plan  Initiate isometrics (with  exceptions of ext and IR per  protocol) PROM to right shoulder, continue with pulleys, seated ranger, modalities PRN for pain relief.    PT Home Exercise Plan  see patient education section    Consulted and Agree with Plan of Care  Patient       Patient will benefit from skilled therapeutic intervention in order to improve the following deficits and impairments:  Decreased activity tolerance, Decreased range of motion, Decreased strength, Postural dysfunction, Pain, Impaired UE functional use, Decreased scar mobility  Visit Diagnosis: Acute pain of right shoulder  Stiffness of right shoulder, not elsewhere classified     Problem List Patient Active Problem List   Diagnosis Date Noted  . Glenoid fracture of shoulder 01/25/2020  . Diabetes mellitus ([place]) 12/09/2019  . COVID-19 12/01/2019  . H/O: hysterectomy 10/23/2018  . History of C-section 10/23/2018  . T2DM (type 2 diabetes mellitus) ([place]) 09/20/2017  . Morbid obesity with BMI of 40.0-44.9, adult ([place]) 02/13/2017  . Hypertension associated with type 2 diabetes mellitus ([place]) 02/14/2015    Gabriela Eves, PT, DPT 03/14/2020, 8:32 AM  [facility] [address] [place], [place], [address] Phone: [phone]   Fax:  [phone]  Name: MAKITA RIVARD MRN: [id number] Date of Birth: Nov 08, 1965

## 2020-03-16 ENCOUNTER — Other Ambulatory Visit: Payer: Self-pay | Admitting: Nurse Practitioner

## 2020-03-16 ENCOUNTER — Encounter: Payer: Self-pay | Admitting: Physical Therapy

## 2020-03-16 ENCOUNTER — Ambulatory Visit: Payer: BC Managed Care – PPO | Admitting: Physical Therapy

## 2020-03-16 ENCOUNTER — Other Ambulatory Visit: Payer: Self-pay

## 2020-03-16 DIAGNOSIS — E1159 Type 2 diabetes mellitus with other circulatory complications: Secondary | ICD-10-CM

## 2020-03-16 DIAGNOSIS — M25511 Pain in right shoulder: Secondary | ICD-10-CM

## 2020-03-16 DIAGNOSIS — M25611 Stiffness of right shoulder, not elsewhere classified: Secondary | ICD-10-CM

## 2020-03-16 NOTE — Therapy (Signed)
[facility] [place], [place], [address] Phone: [phone]   Fax:  [phone]  Physical Therapy Treatment  Patient Details  Name: Brandy Hernandez MRN: [id number] Date of Birth: 02-Aug-1966 Referring Provider (PT): Ophelia Charter, MD   Encounter Date: 03/16/2020  PT End of Session - 03/16/20 0830    Visit Number  19    Number of Visits  24    Date for PT Re-Evaluation  04/06/20    Authorization Type  FOTO (12th visit: 60% limitation); Progress note every 10th visit    PT Start Time  0730    PT Stop Time  0822    PT Time Calculation (min)  52 min    Activity Tolerance  Patient tolerated treatment well;Patient limited by pain    Behavior During Therapy  [facility] for tasks assessed/performed       Past Medical History:  Diagnosis Date  . Basal cell carcinoma   . COVID-19   . Diabetes mellitus without complication ([place])   . Hypertension     Past Surgical History:  Procedure Laterality Date  . ABDOMINAL HYSTERECTOMY    . CESAREAN SECTION    . COLONOSCOPY N/A 01/19/2019   Procedure: COLONOSCOPY;  Surgeon: Danie Binder, MD;  [facility];  Service: Endoscopy;  Laterality: N/A;  9:30  . ENDOMETRIAL ABLATION    . POLYPECTOMY  01/19/2019   Procedure: POLYPECTOMY;  Surgeon: Danie Binder, MD;  [facility];  Service: Endoscopy;;  transverse colon   . REVERSE SHOULDER ARTHROPLASTY Right 01/25/2020   Procedure: TOTAL SHOULDER ARTHROPLASTY;  Surgeon: Hiram Gash, MD;  [facility];  Service: Orthopedics;  Laterality: Right;  . SKIN CANCER EXCISION  2018    There were no vitals filed for this visit.  Subjective Assessment - 03/16/20 0748    Subjective  COVID-19 screening performed upon arrival. "This weather is wreaking havoc on my shoulder."    Pertinent History  R TSA, 01/25/2020, HTN, DM    Limitations  Lifting;House hold activities    Diagnostic tests  x-ray    Patient Stated Goals  "return to all  activities"    Currently in Pain?  Yes    Pain Score  3     Pain Location  Shoulder    Pain Orientation  Right    Pain Descriptors / Indicators  Throbbing    Pain Type  Surgical pain    Pain Onset  1 to 4 weeks ago    Pain Frequency  Constant         [facility] PT Assessment - 03/16/20 0001      Assessment   Medical Diagnosis  Right anatomic stemless total shoulder arthroplasty    Referring Provider (PT)  Ophelia Charter, MD    Onset Date/Surgical Date  01/25/20    Hand Dominance  Right    Next MD Visit  04/19/2020    Prior Therapy  no      Precautions   Precautions  Shoulder    Type of Shoulder Precautions  Follow Dr. Rich Fuchs TSA protocol                   [facility] Adult PT Treatment/Exercise - 03/16/20 0001      Exercises   Exercises  Shoulder      Shoulder Exercises: Pulleys   Flexion  5 minutes      Shoulder Exercises: ROM/Strengthening   Ranger  Seated ranger flexion, CW & CCW  circles x3 minutes each      Modalities   Modalities  Psychologist, educational Location  right shoulder    Electrical Stimulation Action  IFC    Electrical Stimulation Parameters  80-150 hz x10 mins    Electrical Stimulation Goals  Pain      Vasopneumatic   Number Minutes Vasopneumatic   10 minutes    Vasopnuematic Location   Shoulder    Vasopneumatic Pressure  Low    Vasopneumatic Temperature   34      Manual Therapy   Manual Therapy  Passive ROM    Manual therapy comments  rhythmic stabilization in supine at 90 degrees of flexion.    Passive ROM  PROM of R shoulder into flex, ER, and abd with gentle holds at end range with intermittent oscillations for muscle relaxation                  PT Long Term Goals - 02/10/20 0746      PT LONG TERM GOAL #1   Title  Patient will be independent with HEP    Time  8    Period  Weeks    Status  On-going      PT LONG TERM GOAL #2   Title  Patient will report  ability to perform ADLs with right shoulder pain less than or equal to 3/10.    Time  8    Period  Weeks    Status  On-going   not able to perfrom ADL's with UE at this time 02/10/20     PT LONG TERM GOAL #3   Title  Patient will demonstrate 55+ degrees of right shoulder ER AROM to improve donning and doffing apparel.    Time  8    Period  Weeks    Status  On-going      PT LONG TERM GOAL #4   Title  Patient will demonstrate 140+ degrees of right shoulder flexion AROM to improve overhead activities.    Time  8    Period  Weeks    Status  On-going      PT LONG TERM GOAL #5   Title  Patient will demonstrate 4/5 or greater of right shoulder MMT in all planes to improve stability during functional tasks.    Time  8    Period  Weeks    Status  On-going            Plan - 03/16/20 0815    Clinical Impression Statement  Patient responded fairly but with increased guarding during [place]. Patient provide with more oscillations throughout PROM due to pain and guarding. Patient reports an increase of pain to 6/10. Normal response to modalities upon removal as well as a decrease in pain.    Personal Factors and Comorbidities  Comorbidity 2    Comorbidities  right TSA 01/25/2020, HTN, DM    Examination-Activity Limitations  Dressing;Hygiene/Grooming;Toileting;Reach Overhead;Carry;Lift    Stability/Clinical Decision Making  Stable/Uncomplicated    Clinical Decision Making  Low    Rehab Potential  Good    PT Frequency  3x / week    PT Duration  8 weeks    PT Treatment/Interventions  ADLs/Self Care Home Management;Cryotherapy;Electrical Stimulation;Moist Heat;Iontophoresis 4mg /ml Dexamethasone;Neuromuscular re-education;Manual techniques;Passive range of motion;Therapeutic exercise;Therapeutic activities;Functional mobility training;Patient/family education;Vasopneumatic Device    PT Next Visit Plan  Progress note, FOTO, goals. Initiate isometrics (with exceptions of ext and IR per  protocol) PROM  to right shoulder, continue with pulleys, seated ranger, modalities PRN for pain relief.    PT Home Exercise Plan  see patient education section    Consulted and Agree with Plan of Care  Patient       Patient will benefit from skilled therapeutic intervention in order to improve the following deficits and impairments:  Decreased activity tolerance, Decreased range of motion, Decreased strength, Postural dysfunction, Pain, Impaired UE functional use, Decreased scar mobility  Visit Diagnosis: Acute pain of right shoulder  Stiffness of right shoulder, not elsewhere classified     Problem List Patient Active Problem List   Diagnosis Date Noted  . Glenoid fracture of shoulder 01/25/2020  . Diabetes mellitus ([place]) 12/09/2019  . COVID-19 12/01/2019  . H/O: hysterectomy 10/23/2018  . History of C-section 10/23/2018  . T2DM (type 2 diabetes mellitus) ([place]) 09/20/2017  . Morbid obesity with BMI of 40.0-44.9, adult ([place]) 02/13/2017  . Hypertension associated with type 2 diabetes mellitus ([place]) 02/14/2015    Gabriela Eves, PT, DPT 03/16/2020, 8:31 AM  [facility] [address] [place], [place], [address] Phone: [phone]   Fax:  [phone]  Name: Brandy Hernandez MRN: [id number] Date of Birth: Nov 06, 1965

## 2020-03-18 ENCOUNTER — Other Ambulatory Visit: Payer: Self-pay

## 2020-03-18 ENCOUNTER — Encounter: Payer: Self-pay | Admitting: Physical Therapy

## 2020-03-18 ENCOUNTER — Ambulatory Visit: Payer: BC Managed Care – PPO | Admitting: Physical Therapy

## 2020-03-18 DIAGNOSIS — M25611 Stiffness of right shoulder, not elsewhere classified: Secondary | ICD-10-CM

## 2020-03-18 DIAGNOSIS — M25511 Pain in right shoulder: Secondary | ICD-10-CM | POA: Diagnosis not present

## 2020-03-18 NOTE — Therapy (Signed)
[facility] [place], [place], [address] Phone: [phone]   Fax:  [phone]  Physical Therapy Treatment Progress Note Reporting Period 02/26/2020 to 03/18/2020  See note below for Objective Data and Assessment of Progress/Goals. Patient responding well to therapy with improved PROM but with increased pain this week secondary to the weather. Gabriela Eves, PT, DPT     Patient Details  Name: Brandy Hernandez MRN: [id number] Date of Birth: 03-12-1966 Referring Provider (PT): Ophelia Charter, MD   Encounter Date: 03/18/2020  PT End of Session - 03/18/20 0821    Visit Number  20    Number of Visits  24    Date for PT Re-Evaluation  04/06/20    Authorization Type  FOTO (20th visit: 45% limitation); Progress note every 10th visit    PT Start Time  0730    PT Stop Time  0823    PT Time Calculation (min)  53 min    Activity Tolerance  Patient tolerated treatment well    Behavior During Therapy  [facility] for tasks assessed/performed       Past Medical History:  Diagnosis Date  . Basal cell carcinoma   . COVID-19   . Diabetes mellitus without complication ([place])   . Hypertension     Past Surgical History:  Procedure Laterality Date  . ABDOMINAL HYSTERECTOMY    . CESAREAN SECTION    . COLONOSCOPY N/A 01/19/2019   Procedure: COLONOSCOPY;  Surgeon: Danie Binder, MD;  [facility];  Service: Endoscopy;  Laterality: N/A;  9:30  . ENDOMETRIAL ABLATION    . POLYPECTOMY  01/19/2019   Procedure: POLYPECTOMY;  Surgeon: Danie Binder, MD;  [facility];  Service: Endoscopy;;  transverse colon   . REVERSE SHOULDER ARTHROPLASTY Right 01/25/2020   Procedure: TOTAL SHOULDER ARTHROPLASTY;  Surgeon: Hiram Gash, MD;  [facility]: [facility];  Service: Orthopedics;  Laterality: Right;  . SKIN CANCER EXCISION  2018    There were no vitals filed for this visit.  Subjective Assessment - 03/18/20 0735    Subjective   COVID-19 screening performed upon arrival. "I've been working this arm and I think this weather is making me feel more sore."    Pertinent History  R TSA, 01/25/2020, HTN, DM    Limitations  Lifting;House hold activities    Diagnostic tests  x-ray    Patient Stated Goals  "return to all activities"    Currently in Pain?  Yes    Pain Score  3     Pain Location  Shoulder    Pain Orientation  Right    Pain Descriptors / Indicators  Sore;Throbbing    Pain Type  Surgical pain    Pain Onset  More than a month ago    Pain Frequency  Constant         [facility] PT Assessment - 03/18/20 0001      ROM / Strength   AROM / PROM / Strength  AROM;PROM      AROM   AROM Assessment Site  Shoulder    Right/Left Shoulder  Right    Right Shoulder Flexion  125 Degrees   active assisted by pulley     PROM   Right Shoulder Flexion  138 Degrees    Right Shoulder ABduction  124 Degrees    Right Shoulder External Rotation  55 Degrees  PT Long Term Goals - 02/10/20 0746      PT LONG TERM GOAL #1   Title  Patient will be independent with HEP    Time  8    Period  Weeks    Status  On-going      PT LONG TERM GOAL #2   Title  Patient will report ability to perform ADLs with right shoulder pain less than or equal to 3/10.    Time  8    Period  Weeks    Status  On-going   not able to perfrom ADL's with UE at this time 02/10/20     PT LONG TERM GOAL #3   Title  Patient will demonstrate 55+ degrees of right shoulder ER AROM to improve donning and doffing apparel.    Time  8    Period  Weeks    Status  On-going      PT LONG TERM GOAL #4   Title  Patient will demonstrate 140+ degrees of right shoulder flexion AROM to improve overhead activities.    Time  8    Period  Weeks    Status  On-going      PT LONG TERM GOAL #5   Title  Patient will demonstrate 4/5 or greater of right shoulder MMT in all planes to improve stability during functional  tasks.    Time  8    Period  Weeks    Status  On-going            Plan - 03/18/20 0830    Clinical Impression Statement  Patient responded fairly well to therapy session but with ongoing soreness. UT compensatory motions noted with standing ranger but able to adjust. Patient demonstrates 125 degrees of right shoulder AAROM with use of pulleys. Patient noted with increased pain at end range PROM but with improvements since last measurement, see objective measurements. Goals ongoing at this time.    Personal Factors and Comorbidities  Comorbidity 2    Comorbidities  right TSA 01/25/2020, HTN, DM    Examination-Activity Limitations  Dressing;Hygiene/Grooming;Toileting;Reach Overhead;Carry;Lift    Stability/Clinical Decision Making  Stable/Uncomplicated    Clinical Decision Making  Low    Rehab Potential  Good    PT Frequency  3x / week    PT Duration  8 weeks    PT Treatment/Interventions  ADLs/Self Care Home Management;Cryotherapy;Electrical Stimulation;Moist Heat;Iontophoresis 4mg /ml Dexamethasone;Neuromuscular re-education;Manual techniques;Passive range of motion;Therapeutic exercise;Therapeutic activities;Functional mobility training;Patient/family education;Vasopneumatic Device    PT Next Visit Plan  Initiate isometrics (with exceptions of ext and IR per protocol) PROM to right shoulder, continue with pulleys, seated ranger, modalities PRN for pain relief.    PT Home Exercise Plan  see patient education section    Consulted and Agree with Plan of Care  Patient       Patient will benefit from skilled therapeutic intervention in order to improve the following deficits and impairments:  Decreased activity tolerance, Decreased range of motion, Decreased strength, Postural dysfunction, Pain, Impaired UE functional use, Decreased scar mobility  Visit Diagnosis: Acute pain of right shoulder  Stiffness of right shoulder, not elsewhere classified     Problem List Patient Active  Problem List   Diagnosis Date Noted  . Glenoid fracture of shoulder 01/25/2020  . Diabetes mellitus ([place]) 12/09/2019  . COVID-19 12/01/2019  . H/O: hysterectomy 10/23/2018  . History of C-section 10/23/2018  . T2DM (type 2 diabetes mellitus) ([place]) 09/20/2017  . Morbid obesity with BMI of 40.0-44.9, adult (  [place]) 02/13/2017  . Hypertension associated with type 2 diabetes mellitus ([place]) 02/14/2015    Gabriela Eves, PT, DPT 03/18/2020, 8:57 AM  [facility] [address] [place], [place], [address] Phone: [phone]   Fax:  [phone]  Name: Brandy Hernandez MRN: [id number] Date of Birth: 1965/12/03

## 2020-03-21 ENCOUNTER — Encounter: Payer: Self-pay | Admitting: Physical Therapy

## 2020-03-21 ENCOUNTER — Ambulatory Visit: Payer: BC Managed Care – PPO | Admitting: Physical Therapy

## 2020-03-21 ENCOUNTER — Other Ambulatory Visit: Payer: Self-pay

## 2020-03-21 DIAGNOSIS — M25511 Pain in right shoulder: Secondary | ICD-10-CM

## 2020-03-21 DIAGNOSIS — M25611 Stiffness of right shoulder, not elsewhere classified: Secondary | ICD-10-CM

## 2020-03-21 NOTE — Therapy (Signed)
[facility] [place], [place], [address] Phone: [phone]   Fax:  [phone]  Physical Therapy Treatment  Patient Details  Name: Brandy Hernandez MRN: [id number] Date of Birth: Apr 28, 1966 Referring Provider (PT): Ophelia Charter, MD   Encounter Date: 03/21/2020  PT End of Session - 03/21/20 0741    Visit Number  21    Number of Visits  24    Date for PT Re-Evaluation  04/06/20    Authorization Type  FOTO (20th visit: 45% limitation); Progress note every 10th visit    PT Start Time  0730    PT Stop Time  0822    PT Time Calculation (min)  52 min    Activity Tolerance  Patient tolerated treatment well    Behavior During Therapy  [facility] for tasks assessed/performed       Past Medical History:  Diagnosis Date  . Basal cell carcinoma   . COVID-19   . Diabetes mellitus without complication ([place])   . Hypertension     Past Surgical History:  Procedure Laterality Date  . ABDOMINAL HYSTERECTOMY    . CESAREAN SECTION    . COLONOSCOPY N/A 01/19/2019   Procedure: COLONOSCOPY;  Surgeon: Danie Binder, MD;  [facility];  Service: Endoscopy;  Laterality: N/A;  9:30  . ENDOMETRIAL ABLATION    . POLYPECTOMY  01/19/2019   Procedure: POLYPECTOMY;  Surgeon: Danie Binder, MD;  [facility];  Service: Endoscopy;;  transverse colon   . REVERSE SHOULDER ARTHROPLASTY Right 01/25/2020   Procedure: TOTAL SHOULDER ARTHROPLASTY;  Surgeon: Hiram Gash, MD;  [facility];  Service: Orthopedics;  Laterality: Right;  . SKIN CANCER EXCISION  2018    There were no vitals filed for this visit.  Subjective Assessment - 03/21/20 0734    Subjective  COVID-19 screening performed upon arrival. 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    Pertinent History  R TSA, 01/25/2020, HTN, DM    Limitations  Lifting;House hold activities    Diagnostic  tests  x-ray    Patient Stated Goals  "return to all activities"    Currently in Pain?  Yes    Pain Score  3     Pain Location  Shoulder    Pain Orientation  Right    Pain Descriptors / Indicators  Sore;Throbbing    Pain Type  Surgical pain    Pain Onset  More than a month ago    Pain Frequency  Constant         [facility] PT Assessment - 03/21/20 0001      Assessment   Medical Diagnosis  Right anatomic stemless total shoulder arthroplasty    Referring Provider (PT)  Ophelia Charter, MD    Onset Date/Surgical Date  01/25/20    Hand Dominance  Right    Next MD Visit  04/19/2020    Prior Therapy  no      Precautions   Precautions  Shoulder    Type of Shoulder Precautions  Follow Dr. Rich Fuchs TSA protocol                    [facility] Adult PT Treatment/Exercise - 03/21/20 0001      Exercises   Exercises  Shoulder      Shoulder Exercises: Pulleys   Flexion  5 minutes  Shoulder Exercises: ROM/Strengthening   Ranger  standing ranger flexion, CW & CCW circles x3 minutes each      Shoulder Exercises: Isometric Strengthening   Flexion  5X5"    External Rotation  5X5"    ABduction  5X5"    ADduction  5X5"      Modalities   Modalities  Electrical Stimulation;Vasopneumatic      Electrical Stimulation   Electrical Stimulation Location  right shoulder    Electrical Stimulation Action  IFC    Electrical Stimulation Parameters  80-150 hz x10 mins    Electrical Stimulation Goals  Pain      Vasopneumatic   Number Minutes Vasopneumatic   10 minutes    Vasopnuematic Location   Shoulder    Vasopneumatic Pressure  Low    Vasopneumatic Temperature   34      Manual Therapy   Manual Therapy  Passive ROM    Passive ROM  PROM of R shoulder into flex and ER with 10" holds at end range with intermittent oscillations for muscle relaxation                  PT Long Term Goals - 03/18/20 0905      PT LONG TERM GOAL #1   Title  Patient will be independent with HEP     Time  8    Period  Weeks    Status  Achieved      PT LONG TERM GOAL #2   Title  Patient will report ability to perform ADLs with right shoulder pain less than or equal to 3/10.    Time  8    Period  Weeks    Status  On-going      PT LONG TERM GOAL #3   Title  Patient will demonstrate 55+ degrees of right shoulder ER AROM to improve donning and doffing apparel.    Time  8    Period  Weeks    Status  On-going      PT LONG TERM GOAL #4   Title  Patient will demonstrate 140+ degrees of right shoulder flexion AROM to improve overhead activities.    Time  8    Period  Weeks    Status  On-going      PT LONG TERM GOAL #5   Title  Patient will demonstrate 4/5 or greater of right shoulder MMT in all planes to improve stability during functional tasks.    Time  8    Period  Weeks    Status  On-going            Plan - 03/21/20 [id number]    Clinical Impression Statement  Patient tolerated treatment well with the initiation of isometrics (with exception of IR and ext per protocol) Patient still with ongoing tightness into flexion and ER therefore PROM emphasized in those motions. No adverse affects upon removal of modalities.    Personal Factors and Comorbidities  Comorbidity 2    Comorbidities  right TSA 01/25/2020, HTN, DM    Examination-Activity Limitations  Dressing;Hygiene/Grooming;Toileting;Reach Overhead;Carry;Lift    Stability/Clinical Decision Making  Stable/Uncomplicated    Clinical Decision Making  Low    Rehab Potential  Good    PT Frequency  3x / week    PT Duration  8 weeks    PT Treatment/Interventions  ADLs/Self Care Home Management;Cryotherapy;Electrical Stimulation;Moist Heat;Iontophoresis 4mg /ml Dexamethasone;Neuromuscular re-education;Manual techniques;Passive range of motion;Therapeutic exercise;Therapeutic activities;Functional mobility training;Patient/family education;Vasopneumatic Device    PT Next Visit Plan  continue isometrics (with exceptions of ext and IR per  protocol) PROM to right shoulder, continue with pulleys, seated ranger, modalities PRN for pain relief.    PT Home Exercise Plan  see patient education section       Patient will benefit from skilled therapeutic intervention in order to improve the following deficits and impairments:  Decreased activity tolerance, Decreased range of motion, Decreased strength, Postural dysfunction, Pain, Impaired UE functional use, Decreased scar mobility  Visit Diagnosis: Acute pain of right shoulder  Stiffness of right shoulder, not elsewhere classified     Problem List Patient Active Problem List   Diagnosis Date Noted  . Glenoid fracture of shoulder 01/25/2020  . Diabetes mellitus ([place]) 12/09/2019  . COVID-19 12/01/2019  . H/O: hysterectomy 10/23/2018  . History of C-section 10/23/2018  . T2DM (type 2 diabetes mellitus) ([place]) 09/20/2017  . Morbid obesity with BMI of 40.0-44.9, adult ([place]) 02/13/2017  . Hypertension associated with type 2 diabetes mellitus ([place]) 02/14/2015    Brandy Hernandez 03/21/2020, 8:23 AM  [facility] [address] [place], [place], [address] Phone: [phone]   Fax:  [phone]  Name: Brandy Hernandez MRN: [id number] Date of Birth: 03-Sep-1966

## 2020-03-23 ENCOUNTER — Other Ambulatory Visit: Payer: Self-pay

## 2020-03-23 ENCOUNTER — Ambulatory Visit: Payer: BC Managed Care – PPO | Admitting: Physical Therapy

## 2020-03-23 ENCOUNTER — Encounter: Payer: Self-pay | Admitting: Physical Therapy

## 2020-03-23 DIAGNOSIS — M25511 Pain in right shoulder: Secondary | ICD-10-CM

## 2020-03-23 DIAGNOSIS — M25611 Stiffness of right shoulder, not elsewhere classified: Secondary | ICD-10-CM

## 2020-03-23 NOTE — Therapy (Signed)
[facility] [place], [place], [address] Phone: [phone]   Fax:  [phone]  Physical Therapy Treatment  Patient Details  Name: Brandy Hernandez MRN: [id number] Date of Birth: 28-May-1966 Referring Provider (PT): Ophelia Charter, MD   Encounter Date: 03/23/2020  PT End of Session - 03/23/20 0828    Visit Number  22    Number of Visits  24    Date for PT Re-Evaluation  04/06/20    Authorization Type  FOTO (20th visit: 45% limitation); Progress note every 10th visit    PT Start Time  0730    PT Stop Time  0824    PT Time Calculation (min)  54 min    Activity Tolerance  Patient tolerated treatment well    Behavior During Therapy  [facility] for tasks assessed/performed       Past Medical History:  Diagnosis Date  . Basal cell carcinoma   . COVID-19   . Diabetes mellitus without complication ([place])   . Hypertension     Past Surgical History:  Procedure Laterality Date  . ABDOMINAL HYSTERECTOMY    . CESAREAN SECTION    . COLONOSCOPY N/A 01/19/2019   Procedure: COLONOSCOPY;  Surgeon: Danie Binder, MD;  [facility];  Service: Endoscopy;  Laterality: N/A;  9:30  . ENDOMETRIAL ABLATION    . POLYPECTOMY  01/19/2019   Procedure: POLYPECTOMY;  Surgeon: Danie Binder, MD;  [facility];  Service: Endoscopy;;  transverse colon   . REVERSE SHOULDER ARTHROPLASTY Right 01/25/2020   Procedure: TOTAL SHOULDER ARTHROPLASTY;  Surgeon: Hiram Gash, MD;  [facility];  Service: Orthopedics;  Laterality: Right;  . SKIN CANCER EXCISION  2018    There were no vitals filed for this visit.  Subjective Assessment - 03/23/20 0745    Subjective  COVID-19 screening performed upon arrival. Reports doing "alright"    Pertinent History  R TSA, 01/25/2020, HTN, DM    Limitations  Lifting;House hold activities    Diagnostic tests  x-ray    Patient Stated Goals  "return to all activities"    Currently in Pain?  Yes    Pain  Score  --   " little bit"   Pain Location  Shoulder    Pain Orientation  Right    Pain Descriptors / Indicators  Sore    Pain Type  Surgical pain    Pain Onset  More than a month ago    Pain Frequency  Constant         [facility] PT Assessment - 03/23/20 0001      Assessment   Medical Diagnosis  Right anatomic stemless total shoulder arthroplasty    Referring Provider (PT)  Ophelia Charter, MD    Onset Date/Surgical Date  01/25/20    Hand Dominance  Right    Next MD Visit  04/19/2020    Prior Therapy  no      Precautions   Precautions  Shoulder    Type of Shoulder Precautions  Follow Dr. Rich Fuchs TSA protocol                    [facility] Adult PT Treatment/Exercise - 03/23/20 0001      Exercises   Exercises  Shoulder      Shoulder Exercises: Pulleys   Flexion  5 minutes      Shoulder Exercises: ROM/Strengthening   Ranger  standing ranger flexion, CW & CCW circles x3 minutes each  Shoulder Exercises: Isometric Strengthening   Flexion  5X5"    External Rotation  5X5"    ABduction  5X5"    ADduction  5X5"      Modalities   Modalities  Electrical Stimulation;Vasopneumatic      Electrical Stimulation   Electrical Stimulation Location  right shoulder    Electrical Stimulation Action  IFC    Electrical Stimulation Parameters  80-150 hz x10 mins    Electrical Stimulation Goals  Pain      Vasopneumatic   Number Minutes Vasopneumatic   10 minutes    Vasopnuematic Location   Shoulder    Vasopneumatic Pressure  Low    Vasopneumatic Temperature   34      Manual Therapy   Manual Therapy  Passive ROM    Passive ROM  PROM of R shoulder into flex and ER with 10" holds at end range with intermittent oscillations for muscle relaxation                  PT Long Term Goals - 03/18/20 0905      PT LONG TERM GOAL #1   Title  Patient will be independent with HEP    Time  8    Period  Weeks    Status  Achieved      PT LONG TERM GOAL #2   Title  Patient  will report ability to perform ADLs with right shoulder pain less than or equal to 3/10.    Time  8    Period  Weeks    Status  On-going      PT LONG TERM GOAL #3   Title  Patient will demonstrate 55+ degrees of right shoulder ER AROM to improve donning and doffing apparel.    Time  8    Period  Weeks    Status  On-going      PT LONG TERM GOAL #4   Title  Patient will demonstrate 140+ degrees of right shoulder flexion AROM to improve overhead activities.    Time  8    Period  Weeks    Status  On-going      PT LONG TERM GOAL #5   Title  Patient will demonstrate 4/5 or greater of right shoulder MMT in all planes to improve stability during functional tasks.    Time  8    Period  Weeks    Status  On-going            Plan - 03/23/20 [id number]    Clinical Impression Statement  Patient responded well to therapy session. Patient demonstrates improved kinesthetic awareness of R shoulder and is able to improve posture when compensating. Patient with more tightness into shoulder flexion today. Oscillations required to decrease pain and guarding.    Personal Factors and Comorbidities  Comorbidity 2    Comorbidities  right TSA 01/25/2020, HTN, DM    Examination-Activity Limitations  Dressing;Hygiene/Grooming;Toileting;Reach Overhead;Carry;Lift    Stability/Clinical Decision Making  Stable/Uncomplicated    Clinical Decision Making  Low    Rehab Potential  Good    PT Frequency  3x / week    PT Duration  8 weeks    PT Treatment/Interventions  ADLs/Self Care Home Management;Cryotherapy;Electrical Stimulation;Moist Heat;Iontophoresis 4mg /ml Dexamethasone;Neuromuscular re-education;Manual techniques;Passive range of motion;Therapeutic exercise;Therapeutic activities;Functional mobility training;Patient/family education;Vasopneumatic Device    PT Next Visit Plan  continue isometrics (with exceptions of ext and IR per protocol) PROM to right shoulder, continue with pulleys, seated ranger, modalities  PRN for  pain relief.    PT Home Exercise Plan  see patient education section    Consulted and Agree with Plan of Care  Patient       Patient will benefit from skilled therapeutic intervention in order to improve the following deficits and impairments:  Decreased activity tolerance, Decreased range of motion, Decreased strength, Postural dysfunction, Pain, Impaired UE functional use, Decreased scar mobility  Visit Diagnosis: Acute pain of right shoulder  Stiffness of right shoulder, not elsewhere classified     Problem List Patient Active Problem List   Diagnosis Date Noted  . Glenoid fracture of shoulder 01/25/2020  . Diabetes mellitus ([place]) 12/09/2019  . COVID-19 12/01/2019  . H/O: hysterectomy 10/23/2018  . History of C-section 10/23/2018  . T2DM (type 2 diabetes mellitus) ([place]) 09/20/2017  . Morbid obesity with BMI of 40.0-44.9, adult ([place]) 02/13/2017  . Hypertension associated with type 2 diabetes mellitus ([place]) 02/14/2015    Gabriela Eves, PT, DPT 03/23/2020, 8:28 AM  [facility] [place], [place], [address] Phone: [phone]   Fax:  [phone]  Name: TYINA HAMMERLE MRN: [id number] Date of Birth: 07/10/1966

## 2020-03-24 ENCOUNTER — Other Ambulatory Visit: Payer: Self-pay

## 2020-03-24 ENCOUNTER — Ambulatory Visit: Payer: BC Managed Care – PPO | Admitting: Physical Therapy

## 2020-03-24 ENCOUNTER — Encounter: Payer: Self-pay | Admitting: Physical Therapy

## 2020-03-24 DIAGNOSIS — M25511 Pain in right shoulder: Secondary | ICD-10-CM

## 2020-03-24 DIAGNOSIS — M25611 Stiffness of right shoulder, not elsewhere classified: Secondary | ICD-10-CM

## 2020-03-24 NOTE — Therapy (Signed)
[facility] [place], [place], [address] Phone: [phone]   Fax:  [phone]  Physical Therapy Treatment  Patient Details  Name: Brandy Hernandez MRN: [id number] Date of Birth: 1966/05/28 Referring Provider (PT): Ophelia Charter, MD   Encounter Date: 03/24/2020  PT End of Session - 03/24/20 0737    Visit Number  23    Number of Visits  24    Date for PT Re-Evaluation  04/06/20    Authorization Type  FOTO (20th visit: 45% limitation); Progress note every 10th visit    PT Start Time  0730    PT Stop Time  0813    PT Time Calculation (min)  43 min    Activity Tolerance  Patient tolerated treatment well    Behavior During Therapy  WFL for tasks assessed/performed       Past Medical History:  Diagnosis Date  . Basal cell carcinoma   . COVID-19   . Diabetes mellitus without complication ([place])   . Hypertension     Past Surgical History:  Procedure Laterality Date  . ABDOMINAL HYSTERECTOMY    . CESAREAN SECTION    . COLONOSCOPY N/A 01/19/2019   Procedure: COLONOSCOPY;  Surgeon: Danie Binder, MD;  [facility];  Service: Endoscopy;  Laterality: N/A;  9:30  . ENDOMETRIAL ABLATION    . POLYPECTOMY  01/19/2019   Procedure: POLYPECTOMY;  Surgeon: Danie Binder, MD;  [facility];  Service: Endoscopy;;  transverse colon   . REVERSE SHOULDER ARTHROPLASTY Right 01/25/2020   Procedure: TOTAL SHOULDER ARTHROPLASTY;  Surgeon: Hiram Gash, MD;  [facility]: [facility];  Service: Orthopedics;  Laterality: Right;  . SKIN CANCER EXCISION  2018    There were no vitals filed for this visit.  Subjective Assessment - 03/24/20 0731    Subjective  COVID-19 screening performed upon arrival. Patient reported some ongoing soreness yet overall doing good    Pertinent History  R TSA, 01/25/2020, HTN, DM    Limitations  Lifting;House hold activities    Diagnostic tests  x-ray    Patient Stated Goals  "return to all  activities"    Currently in Pain?  Yes    Pain Score  3     Pain Location  Shoulder    Pain Orientation  Right    Pain Descriptors / Indicators  Sore    Pain Type  Surgical pain    Pain Onset  More than a month ago    Pain Frequency  Intermittent    Aggravating Factors   certain movements    Pain Relieving Factors  rest         [facility] PT Assessment - 03/24/20 0001      PROM   PROM Assessment Site  Shoulder    Right/Left Shoulder  Right    Right Shoulder Flexion  140 Degrees    Right Shoulder External Rotation  60 Degrees                    [facility] Adult PT Treatment/Exercise - 03/24/20 0001      Shoulder Exercises: Pulleys   Flexion  5 minutes      Shoulder Exercises: ROM/Strengthening   Ranger  standing ranger flexion, CW & CCW circles 2x10 each      Electrical Stimulation   Electrical Stimulation Location  right shoulder    Electrical Stimulation Action  IFC    Electrical Stimulation Parameters  80-150hz  x6min  Electrical Stimulation Goals  Pain      Vasopneumatic   Number Minutes Vasopneumatic   10 minutes    Vasopnuematic Location   Shoulder    Vasopneumatic Pressure  Low    Vasopneumatic Temperature   34      Manual Therapy   Manual Therapy  Passive ROM    Manual therapy comments  rhythmic stabs of flexion at 90, ER, add, abd in scaption with holds    Passive ROM  PROM of R shoulder into flex and ER with 10" holds at end range with intermittent oscillations for muscle relaxation                  PT Long Term Goals - 03/24/20 0740      PT LONG TERM GOAL #1   Title  Patient will be independent with HEP    Time  8    Period  Weeks    Status  Achieved      PT LONG TERM GOAL #2   Title  Patient will report ability to perform ADLs with right shoulder pain less than or equal to 3/10.    Time  8    Period  Weeks    Status  On-going      PT LONG TERM GOAL #3   Title  Patient will demonstrate 55+ degrees of right shoulder ER AROM to  improve donning and doffing apparel.    Time  8    Period  Weeks    Status  On-going      PT LONG TERM GOAL #4   Title  Patient will demonstrate 140+ degrees of right shoulder flexion AROM to improve overhead activities.    Time  8    Period  Weeks    Status  On-going      PT LONG TERM GOAL #5   Title  Patient will demonstrate 4/5 or greater of right shoulder MMT in all planes to improve stability during functional tasks.    Time  8    Period  Weeks    Status  On-going            Plan - 03/24/20 [id number]    Clinical Impression Statement  Patient tolerated treatment well today. Patient hs reported doing HEP daily instructed by PT. Today focused on AAROM and manual ROM to improve mobility. Patient has overall improved ROM and decreased soreness per reported. Patient continues to have limitations due to healing and protocol limitations. All current goals are progressing.    Personal Factors and Comorbidities  Comorbidity 2    Comorbidities  right TSA 01/25/2020, HTN, DM    Examination-Activity Limitations  Dressing;Hygiene/Grooming;Toileting;Reach Overhead;Carry;Lift    Stability/Clinical Decision Making  Stable/Uncomplicated    Rehab Potential  Good    PT Frequency  3x / week    PT Duration  8 weeks    PT Treatment/Interventions  ADLs/Self Care Home Management;Cryotherapy;Electrical Stimulation;Moist Heat;Iontophoresis 4mg /ml Dexamethasone;Neuromuscular re-education;Manual techniques;Passive range of motion;Therapeutic exercise;Therapeutic activities;Functional mobility training;Patient/family education;Vasopneumatic Device    PT Next Visit Plan  continue isometrics (with exceptions of ext and IR per protocol) PROM to right shoulder, continue with pulleys, seated ranger, modalities PRN for pain relief. (re-cert sent today)    Consulted and Agree with Plan of Care  Patient       Patient will benefit from skilled therapeutic intervention in order to improve the following deficits and  impairments:  Decreased activity tolerance, Decreased range of motion, Decreased strength, Postural dysfunction, Pain,  Impaired UE functional use, Decreased scar mobility  Visit Diagnosis: Acute pain of right shoulder  Stiffness of right shoulder, not elsewhere classified     Problem List Patient Active Problem List   Diagnosis Date Noted  . Glenoid fracture of shoulder 01/25/2020  . Diabetes mellitus ([place]) 12/09/2019  . COVID-19 12/01/2019  . H/O: hysterectomy 10/23/2018  . History of C-section 10/23/2018  . T2DM (type 2 diabetes mellitus) ([place]) 09/20/2017  . Morbid obesity with BMI of 40.0-44.9, adult ([place]) 02/13/2017  . Hypertension associated with type 2 diabetes mellitus ([place]) 02/14/2015    Ladean Raya, PTA 03/24/20 8:18 AM  [facility] [place], [place], [address] Phone: [phone]   Fax:  [phone]  Name: Brandy Hernandez MRN: [id number] Date of Birth: Dec 27, 1965

## 2020-03-28 ENCOUNTER — Ambulatory Visit: Payer: BC Managed Care – PPO | Admitting: Physical Therapy

## 2020-03-28 ENCOUNTER — Encounter: Payer: Self-pay | Admitting: Physical Therapy

## 2020-03-28 ENCOUNTER — Other Ambulatory Visit: Payer: Self-pay

## 2020-03-28 DIAGNOSIS — M25511 Pain in right shoulder: Secondary | ICD-10-CM | POA: Diagnosis not present

## 2020-03-28 DIAGNOSIS — M25611 Stiffness of right shoulder, not elsewhere classified: Secondary | ICD-10-CM

## 2020-03-28 NOTE — Therapy (Signed)
[facility] [place], [place], [address] Phone: [phone]   Fax:  [phone]  Physical Therapy Treatment  Patient Details  Name: Brandy Hernandez MRN: [id number] Date of Birth: Jun 03, 1966 Referring Provider (PT): Ophelia Charter, MD   Encounter Date: 03/28/2020  PT End of Session - 03/28/20 0743    Visit Number  24    Number of Visits  30    Date for PT Re-Evaluation  05/04/20    Authorization Type  FOTO (20th visit: 45% limitation); Progress note every 10th visit    PT Start Time  0730    PT Stop Time  0821    PT Time Calculation (min)  51 min    Activity Tolerance  Patient tolerated treatment well    Behavior During Therapy  [facility] for tasks assessed/performed       Past Medical History:  Diagnosis Date  . Basal cell carcinoma   . COVID-19   . Diabetes mellitus without complication ([place])   . Hypertension     Past Surgical History:  Procedure Laterality Date  . ABDOMINAL HYSTERECTOMY    . CESAREAN SECTION    . COLONOSCOPY N/A 01/19/2019   Procedure: COLONOSCOPY;  Surgeon: Danie Binder, MD;  [facility];  Service: Endoscopy;  Laterality: N/A;  9:30  . ENDOMETRIAL ABLATION    . POLYPECTOMY  01/19/2019   Procedure: POLYPECTOMY;  Surgeon: Danie Binder, MD;  [facility];  Service: Endoscopy;;  transverse colon   . REVERSE SHOULDER ARTHROPLASTY Right 01/25/2020   Procedure: TOTAL SHOULDER ARTHROPLASTY;  Surgeon: Hiram Gash, MD;  [facility];  Service: Orthopedics;  Laterality: Right;  . SKIN CANCER EXCISION  2018    There were no vitals filed for this visit.  Subjective Assessment - 03/28/20 0742    Subjective  COVID-19 screening performed upon arrival. Patient arrives with more soreness today as she has been trying to sleep in the bed.    Pertinent History  R TSA, 01/25/2020, HTN, DM    Limitations  Lifting;House hold activities    Diagnostic tests  x-ray    Patient Stated Goals   "return to all activities"    Currently in Pain?  Yes   did not provide number on pain scale   Pain Location  Shoulder    Pain Orientation  Right    Pain Descriptors / Indicators  Sore    Pain Type  Surgical pain    Pain Onset  More than a month ago    Pain Frequency  Intermittent         [facility] PT Assessment - 03/28/20 0001      Assessment   Medical Diagnosis  Right anatomic stemless total shoulder arthroplasty    Referring Provider (PT)  Ophelia Charter, MD    Onset Date/Surgical Date  01/25/20    Hand Dominance  Right    Next MD Visit  04/19/2020    Prior Therapy  no      Precautions   Precautions  Shoulder    Type of Shoulder Precautions  Follow Dr. Rich Fuchs TSA protocol                    [facility] Adult PT Treatment/Exercise - 03/28/20 0001      Exercises   Exercises  Shoulder      Shoulder Exercises: Pulleys   Flexion  5 minutes      Shoulder Exercises: ROM/Strengthening   Ranger  standing ranger flexion, CW & CCW circles x3 mins each      Shoulder Exercises: IT sales professional  --    Other Shoulder Stretches  table stretch 5" hold x10      Modalities   Modalities  Electrical Stimulation;Vasopneumatic      Electrical Stimulation   Electrical Stimulation Location  right shoulder    Electrical Stimulation Action  IFC    Electrical Stimulation Parameters  80-150 hz x10 mins    Electrical Stimulation Goals  Pain      Vasopneumatic   Number Minutes Vasopneumatic   10 minutes    Vasopnuematic Location   Shoulder    Vasopneumatic Pressure  Low    Vasopneumatic Temperature   34      Manual Therapy   Manual Therapy  Passive ROM    Manual therapy comments  rhythmic stabs of flexion at 90, ER, add, abd in scaption with holds    Passive ROM  PROM of R shoulder into flex and ER with 10" holds at end range with intermittent oscillations for muscle relaxation                  PT Long Term Goals - 03/24/20 0740      PT LONG TERM GOAL #1    Title  Patient will be independent with HEP    Time  8    Period  Weeks    Status  Achieved      PT LONG TERM GOAL #2   Title  Patient will report ability to perform ADLs with right shoulder pain less than or equal to 3/10.    Time  8    Period  Weeks    Status  On-going      PT LONG TERM GOAL #3   Title  Patient will demonstrate 55+ degrees of right shoulder ER AROM to improve donning and doffing apparel.    Time  8    Period  Weeks    Status  On-going      PT LONG TERM GOAL #4   Title  Patient will demonstrate 140+ degrees of right shoulder flexion AROM to improve overhead activities.    Time  8    Period  Weeks    Status  On-going      PT LONG TERM GOAL #5   Title  Patient will demonstrate 4/5 or greater of right shoulder MMT in all planes to improve stability during functional tasks.    Time  8    Period  Weeks    Status  On-going            Plan - 03/28/20 0815    Clinical Impression Statement  Patient responded well to therapy session but with ongoing discomfort with shoulder flexion AAROM. Patient noted with smoother PROM to right shoulder with slight increase of pain at end range. No adverse affects upon removal of modalities.    Personal Factors and Comorbidities  Comorbidity 2    Comorbidities  right TSA 01/25/2020, HTN, DM    Examination-Activity Limitations  Dressing;Hygiene/Grooming;Toileting;Reach Overhead;Carry;Lift    Stability/Clinical Decision Making  Stable/Uncomplicated    Clinical Decision Making  Low    Rehab Potential  Good    PT Frequency  3x / week    PT Duration  8 weeks    PT Treatment/Interventions  ADLs/Self Care Home Management;Cryotherapy;Electrical Stimulation;Moist Heat;Iontophoresis 4mg /ml Dexamethasone;Neuromuscular re-education;Manual techniques;Passive range of motion;Therapeutic exercise;Therapeutic activities;Functional mobility training;Patient/family education;Vasopneumatic Device  PT Next Visit Plan  continue isometrics (with  exceptions of ext and IR per protocol) PROM to right shoulder, continue with pulleys, seated ranger, modalities PRN for pain relief. (re-cert sent today)    PT Home Exercise Plan  see patient education section    Consulted and Agree with Plan of Care  Patient       Patient will benefit from skilled therapeutic intervention in order to improve the following deficits and impairments:  Decreased activity tolerance, Decreased range of motion, Decreased strength, Postural dysfunction, Pain, Impaired UE functional use, Decreased scar mobility  Visit Diagnosis: Acute pain of right shoulder  Stiffness of right shoulder, not elsewhere classified     Problem List Patient Active Problem List   Diagnosis Date Noted  . Glenoid fracture of shoulder 01/25/2020  . Diabetes mellitus ([place]) 12/09/2019  . COVID-19 12/01/2019  . H/O: hysterectomy 10/23/2018  . History of C-section 10/23/2018  . T2DM (type 2 diabetes mellitus) ([place]) 09/20/2017  . Morbid obesity with BMI of 40.0-44.9, adult ([place]) 02/13/2017  . Hypertension associated with type 2 diabetes mellitus ([place]) 02/14/2015    Gabriela Eves, PT, DPT 03/28/2020, 9:49 AM  [facility] [address] [place], [place], [address] Phone: [phone]   Fax:  [phone]  Name: Brandy Hernandez MRN: [id number] Date of Birth: April 05, 1966

## 2020-03-30 ENCOUNTER — Ambulatory Visit: Payer: BC Managed Care – PPO | Admitting: Physical Therapy

## 2020-03-30 ENCOUNTER — Encounter: Payer: Self-pay | Admitting: Physical Therapy

## 2020-03-30 ENCOUNTER — Other Ambulatory Visit: Payer: Self-pay

## 2020-03-30 DIAGNOSIS — M25511 Pain in right shoulder: Secondary | ICD-10-CM

## 2020-03-30 DIAGNOSIS — M25611 Stiffness of right shoulder, not elsewhere classified: Secondary | ICD-10-CM

## 2020-03-30 NOTE — Therapy (Signed)
[facility] [place], [place], [address] Phone: [phone]   Fax:  [phone]  Physical Therapy Treatment  Patient Details  Name: Brandy Hernandez MRN: [id number] Date of Birth: 1966/01/15 Referring Provider (PT): Ophelia Charter, MD   Encounter Date: 03/30/2020  PT End of Session - 03/30/20 0826    Visit Number  25    Number of Visits  30    Date for PT Re-Evaluation  05/04/20    Authorization Type  FOTO (20th visit: 45% limitation); Progress note every 10th visit    PT Start Time  0730    PT Stop Time  0820    PT Time Calculation (min)  50 min    Activity Tolerance  Patient tolerated treatment well    Behavior During Therapy  [place] for tasks assessed/performed       Past Medical History:  Diagnosis Date  . Basal cell carcinoma   . COVID-19   . Diabetes mellitus without complication ([place])   . Hypertension     Past Surgical History:  Procedure Laterality Date  . ABDOMINAL HYSTERECTOMY    . CESAREAN SECTION    . COLONOSCOPY N/A 01/19/2019   Procedure: COLONOSCOPY;  Surgeon: Danie Binder, MD;  [facility];  Service: Endoscopy;  Laterality: N/A;  9:30  . ENDOMETRIAL ABLATION    . POLYPECTOMY  01/19/2019   Procedure: POLYPECTOMY;  Surgeon: Danie Binder, MD;  [facility];  Service: Endoscopy;;  transverse colon   . REVERSE SHOULDER ARTHROPLASTY Right 01/25/2020   Procedure: TOTAL SHOULDER ARTHROPLASTY;  Surgeon: Hiram Gash, MD;  [facility];  Service: Orthopedics;  Laterality: Right;  . SKIN CANCER EXCISION  2018    There were no vitals filed for this visit.  Subjective Assessment - 03/30/20 0821    Subjective  COVID-19 screening performed upon arrival. Patient arrives a little sore. Has been playing the keyboard at church.    Pertinent History  R TSA, 01/25/2020, HTN, DM    Limitations  Lifting;House hold activities    Diagnostic tests  x-ray    Patient Stated Goals  "return to all  activities"    Currently in Pain?  Yes   did not provide number on pain scale        [facility] PT Assessment - 03/30/20 0001      Assessment   Medical Diagnosis  Right anatomic stemless total shoulder arthroplasty    Referring Provider (PT)  Ophelia Charter, MD    Onset Date/Surgical Date  01/25/20    Hand Dominance  Right    Next MD Visit  04/19/2020    Prior Therapy  no      Precautions   Precautions  Shoulder    Type of Shoulder Precautions  Follow Dr. Rich Fuchs TSA protocol                    [facility] Adult PT Treatment/Exercise - 03/30/20 0001      Exercises   Exercises  Shoulder      Shoulder Exercises: Pulleys   Flexion  5 minutes      Shoulder Exercises: ROM/Strengthening   UBE (Upper Arm Bike)  120 RPM 6 mins, 3 fwd, 3 bwd AAROM    [place]  circles CW, CCW, and flexion x3 mins each      Modalities   Modalities  Electrical Stimulation;Vasopneumatic      Electrical Stimulation   Electrical Stimulation Location  right shoulder  Electrical Stimulation Action  IFC    Electrical Stimulation Parameters  80-150 hz x10 mins    Electrical Stimulation Goals  Pain      Vasopneumatic   Number Minutes Vasopneumatic   10 minutes    Vasopnuematic Location   Shoulder    Vasopneumatic Pressure  Low    Vasopneumatic Temperature   34      Manual Therapy   Manual Therapy  Passive ROM;Scapular mobilization    Scapular Mobilization  scapular mobilization in left sidelying in all planes to improve mobility    Passive ROM  PROM of R shoulder into flex and ER with 10" holds at end range with intermittent oscillations for muscle relaxation                  PT Long Term Goals - 03/24/20 0740      PT LONG TERM GOAL #1   Title  Patient will be independent with HEP    Time  8    Period  Weeks    Status  Achieved      PT LONG TERM GOAL #2   Title  Patient will report ability to perform ADLs with right shoulder pain less than or equal to 3/10.    Time  8     Period  Weeks    Status  On-going      PT LONG TERM GOAL #3   Title  Patient will demonstrate 55+ degrees of right shoulder ER AROM to improve donning and doffing apparel.    Time  8    Period  Weeks    Status  On-going      PT LONG TERM GOAL #4   Title  Patient will demonstrate 140+ degrees of right shoulder flexion AROM to improve overhead activities.    Time  8    Period  Weeks    Status  On-going      PT LONG TERM GOAL #5   Title  Patient will demonstrate 4/5 or greater of right shoulder MMT in all planes to improve stability during functional tasks.    Time  8    Period  Weeks    Status  On-going            Plan - 03/30/20 0827    Clinical Impression Statement  Patient was able to tolerate treatment fairly. Patient still with pain towards end range AAROM. UBE initiated for AAROM with good response but required cuing to prevent UT compensatory motions. Scapular mobility WFL. No adverse affects upon removal of modalities.    Personal Factors and Comorbidities  Comorbidity 2    Comorbidities  right TSA 01/25/2020, HTN, DM    Examination-Activity Limitations  Dressing;Hygiene/Grooming;Toileting;Reach Overhead;Carry;Lift    Stability/Clinical Decision Making  Stable/Uncomplicated    Clinical Decision Making  Low    Rehab Potential  Good    PT Frequency  3x / week    PT Duration  8 weeks    PT Treatment/Interventions  ADLs/Self Care Home Management;Cryotherapy;Electrical Stimulation;Moist Heat;Iontophoresis 4mg /ml Dexamethasone;Neuromuscular re-education;Manual techniques;Passive range of motion;Therapeutic exercise;Therapeutic activities;Functional mobility training;Patient/family education;Vasopneumatic Device    PT Next Visit Plan  continue isometrics (with exceptions of ext and IR per protocol) PROM to right shoulder, continue with pulleys, seated ranger, modalities PRN for pain relief.    PT Home Exercise Plan  see patient education section    Consulted and Agree with Plan  of Care  Patient       Patient will benefit from skilled  therapeutic intervention in order to improve the following deficits and impairments:  Decreased activity tolerance, Decreased range of motion, Decreased strength, Postural dysfunction, Pain, Impaired UE functional use, Decreased scar mobility  Visit Diagnosis: Acute pain of right shoulder  Stiffness of right shoulder, not elsewhere classified     Problem List Patient Active Problem List   Diagnosis Date Noted  . Glenoid fracture of shoulder 01/25/2020  . Diabetes mellitus ([place]) 12/09/2019  . COVID-19 12/01/2019  . H/O: hysterectomy 10/23/2018  . History of C-section 10/23/2018  . T2DM (type 2 diabetes mellitus) ([place]) 09/20/2017  . Morbid obesity with BMI of 40.0-44.9, adult ([place]) 02/13/2017  . Hypertension associated with type 2 diabetes mellitus ([place]) 02/14/2015    Gabriela Eves, PT, DPT 03/30/2020, 8:37 AM  [facility] [address] [place], [place], [address] Phone: [phone]   Fax:  [phone]  Name: Brandy Hernandez MRN: [id number] Date of Birth: Oct 26, 1966

## 2020-04-01 ENCOUNTER — Encounter: Payer: Self-pay | Admitting: Physical Therapy

## 2020-04-01 ENCOUNTER — Other Ambulatory Visit: Payer: Self-pay

## 2020-04-01 ENCOUNTER — Ambulatory Visit: Payer: BC Managed Care – PPO | Admitting: Physical Therapy

## 2020-04-01 DIAGNOSIS — M25511 Pain in right shoulder: Secondary | ICD-10-CM | POA: Diagnosis not present

## 2020-04-01 DIAGNOSIS — M25611 Stiffness of right shoulder, not elsewhere classified: Secondary | ICD-10-CM

## 2020-04-01 NOTE — Therapy (Signed)
[facility] [place], [place], [address] Phone: [phone]   Fax:  [phone]  Physical Therapy Treatment  Patient Details  Name: Brandy Hernandez MRN: [id number] Date of Birth: 03/10/1966 Referring Provider (PT): Ophelia Charter, MD   Encounter Date: 04/01/2020  PT End of Session - 04/01/20 0943    Visit Number  26    Number of Visits  30    Date for PT Re-Evaluation  05/04/20    Authorization Type  FOTO (20th visit: 45% limitation); Progress note every 10th visit    PT Start Time  [phone]    PT Stop Time  1029    PT Time Calculation (min)  43 min    Activity Tolerance  Patient tolerated treatment well    Behavior During Therapy  [facility] for tasks assessed/performed       Past Medical History:  Diagnosis Date  . Basal cell carcinoma   . COVID-19   . Diabetes mellitus without complication ([place])   . Hypertension     Past Surgical History:  Procedure Laterality Date  . ABDOMINAL HYSTERECTOMY    . CESAREAN SECTION    . COLONOSCOPY N/A 01/19/2019   Procedure: COLONOSCOPY;  Surgeon: Danie Binder, MD;  [facility];  Service: Endoscopy;  Laterality: N/A;  9:30  . ENDOMETRIAL ABLATION    . POLYPECTOMY  01/19/2019   Procedure: POLYPECTOMY;  Surgeon: Danie Binder, MD;  [facility];  Service: Endoscopy;;  transverse colon   . REVERSE SHOULDER ARTHROPLASTY Right 01/25/2020   Procedure: TOTAL SHOULDER ARTHROPLASTY;  Surgeon: Hiram Gash, MD;  [facility]: [facility];  Service: Orthopedics;  Laterality: Right;  . SKIN CANCER EXCISION  2018    There were no vitals filed for this visit.  Subjective Assessment - 04/01/20 0943    Subjective  COVID-19 screening performed upon arrival. Reports limited sleep as she could not get comfortable in bed or in the recliner.    Pertinent History  R TSA, 01/25/2020, HTN, DM    Limitations  Lifting;House hold activities    Diagnostic tests  x-ray    Patient Stated Goals   "return to all activities"    Currently in Pain?  Yes    Pain Score  4     Pain Location  Shoulder    Pain Orientation  Right    Pain Descriptors / Indicators  Sore    Pain Type  Surgical pain    Pain Onset  More than a month ago    Pain Frequency  Intermittent         [facility] PT Assessment - 04/01/20 0001      Assessment   Medical Diagnosis  Right anatomic stemless total shoulder arthroplasty    Referring Provider (PT)  Ophelia Charter, MD    Onset Date/Surgical Date  01/25/20    Hand Dominance  Right    Next MD Visit  04/19/2020    Prior Therapy  no      Precautions   Precautions  Shoulder    Type of Shoulder Precautions  Follow Dr. Rich Fuchs TSA protocol                    [facility] Adult PT Treatment/Exercise - 04/01/20 0001      Shoulder Exercises: Pulleys   Flexion  5 minutes      Shoulder Exercises: ROM/Strengthening   Wall Wash  Into flex x15 reps with assist, xfatigue with circles  Modalities   Modalities  Psychologist, educational Location  R shoulder    Electrical Stimulation Action  IFC    Electrical Stimulation Parameters  80-150 hz x10    Electrical Stimulation Goals  Pain      Vasopneumatic   Number Minutes Vasopneumatic   10 minutes    Vasopnuematic Location   Shoulder    Vasopneumatic Pressure  Low    Vasopneumatic Temperature   34      Manual Therapy   Manual Therapy  Passive ROM;Soft tissue mobilization    Soft tissue mobilization  STW to R deltoids, bicep to reduce muscle tightness and pain    Passive ROM  PROM of R shoulder into flexion, ER, IR with holds at end range                  PT Long Term Goals - 03/24/20 0740      PT LONG TERM GOAL #1   Title  Patient will be independent with HEP    Time  8    Period  Weeks    Status  Achieved      PT LONG TERM GOAL #2   Title  Patient will report ability to perform ADLs with right shoulder pain less than or  equal to 3/10.    Time  8    Period  Weeks    Status  On-going      PT LONG TERM GOAL #3   Title  Patient will demonstrate 55+ degrees of right shoulder ER AROM to improve donning and doffing apparel.    Time  8    Period  Weeks    Status  On-going      PT LONG TERM GOAL #4   Title  Patient will demonstrate 140+ degrees of right shoulder flexion AROM to improve overhead activities.    Time  8    Period  Weeks    Status  On-going      PT LONG TERM GOAL #5   Title  Patient will demonstrate 4/5 or greater of right shoulder MMT in all planes to improve stability during functional tasks.    Time  8    Period  Weeks    Status  On-going            Plan - 04/01/20 1026    Clinical Impression Statement  Patient tolerated today's treatment fairly well although with 4/10 R shoulder pain and reports of inability to sleep well secondary to uncomfortable. Patient reports more anterior shoulder pain as well and indicated at proximal R bicep region. Firm end feels and smooth arc of motion noted during PROM of R shoulder in all directions. Muscle tightness palpable in R deltoids today as well. Normal modalities response noted following removal of the modalities.    Personal Factors and Comorbidities  Comorbidity 2    Comorbidities  right TSA 01/25/2020, HTN, DM    Examination-Activity Limitations  Dressing;Hygiene/Grooming;Toileting;Reach Overhead;Carry;Lift    Stability/Clinical Decision Making  Stable/Uncomplicated    Rehab Potential  Good    PT Frequency  3x / week    PT Duration  8 weeks    PT Treatment/Interventions  ADLs/Self Care Home Management;Cryotherapy;Electrical Stimulation;Moist Heat;Iontophoresis 4mg /ml Dexamethasone;Neuromuscular re-education;Manual techniques;Passive range of motion;Therapeutic exercise;Therapeutic activities;Functional mobility training;Patient/family education;Vasopneumatic Device    PT Next Visit Plan  continue isometrics (with exceptions of ext and IR per  protocol) PROM to right shoulder, continue with  pulleys, seated ranger, modalities PRN for pain relief.    PT Home Exercise Plan  see patient education section    Consulted and Agree with Plan of Care  Patient       Patient will benefit from skilled therapeutic intervention in order to improve the following deficits and impairments:  Decreased activity tolerance, Decreased range of motion, Decreased strength, Postural dysfunction, Pain, Impaired UE functional use, Decreased scar mobility  Visit Diagnosis: Acute pain of right shoulder  Stiffness of right shoulder, not elsewhere classified     Problem List Patient Active Problem List   Diagnosis Date Noted  . Glenoid fracture of shoulder 01/25/2020  . Diabetes mellitus ([place]) 12/09/2019  . COVID-19 12/01/2019  . H/O: hysterectomy 10/23/2018  . History of C-section 10/23/2018  . T2DM (type 2 diabetes mellitus) ([place]) 09/20/2017  . Morbid obesity with BMI of 40.0-44.9, adult ([place]) 02/13/2017  . Hypertension associated with type 2 diabetes mellitus ([place]) 02/14/2015    Standley Brooking, PTA 04/01/2020, 10:32 AM  [facility] [address] [place], [place], [address] Phone: [phone]   Fax:  [phone]  Name: MAKENZY LATHAM MRN: [id number] Date of Birth: 06-04-1966

## 2020-04-05 ENCOUNTER — Other Ambulatory Visit: Payer: Self-pay

## 2020-04-05 ENCOUNTER — Ambulatory Visit: Payer: BC Managed Care – PPO | Attending: Orthopaedic Surgery | Admitting: Physical Therapy

## 2020-04-05 ENCOUNTER — Encounter: Payer: Self-pay | Admitting: Physical Therapy

## 2020-04-05 DIAGNOSIS — M25611 Stiffness of right shoulder, not elsewhere classified: Secondary | ICD-10-CM | POA: Insufficient documentation

## 2020-04-05 DIAGNOSIS — M25511 Pain in right shoulder: Secondary | ICD-10-CM | POA: Diagnosis present

## 2020-04-05 NOTE — Therapy (Signed)
[facility] [place], [place], [address] Phone: [phone]   Fax:  [phone]  Physical Therapy Treatment  Patient Details  Name: Brandy Hernandez MRN: [id number] Date of Birth: [birth year] Referring Provider (PT): Ophelia Charter, MD   Encounter Date: 04/05/2020  PT End of Session - 04/05/20 0732    Visit Number  27    Number of Visits  30    Date for PT Re-Evaluation  05/04/20    Authorization Type  FOTO (20th visit: 45% limitation); Progress note every 10th visit    PT Start Time  0730    PT Stop Time  0819    PT Time Calculation (min)  49 min    Activity Tolerance  Patient tolerated treatment well    Behavior During Therapy  [facility] for tasks assessed/performed       Past Medical History:  Diagnosis Date  . Basal cell carcinoma   . COVID-19   . Diabetes mellitus without complication ([place])   . Hypertension     Past Surgical History:  Procedure Laterality Date  . ABDOMINAL HYSTERECTOMY    . CESAREAN SECTION    . COLONOSCOPY N/A 01/19/2019   Procedure: COLONOSCOPY;  Surgeon: Danie Binder, MD;  [facility];  Service: Endoscopy;  Laterality: N/A;  9:30  . ENDOMETRIAL ABLATION    . POLYPECTOMY  01/19/2019   Procedure: POLYPECTOMY;  Surgeon: Danie Binder, MD;  [facility];  Service: Endoscopy;;  transverse colon   . REVERSE SHOULDER ARTHROPLASTY Right 01/25/2020   Procedure: TOTAL SHOULDER ARTHROPLASTY;  Surgeon: Hiram Gash, MD;  [facility];  Service: Orthopedics;  Laterality: Right;  . SKIN CANCER EXCISION  2018    There were no vitals filed for this visit.  Subjective Assessment - 04/05/20 0731    Subjective  COVID-19 screening performed upon arrival. Reports ongoing soreness in her right shoulder. Reports feeling uncomfortable trying to lay out by the pool in the lounge chair.    Pertinent History  R TSA, 01/25/2020, HTN, DM    Limitations  Lifting;House hold activities     Diagnostic tests  x-ray    Patient Stated Goals  "return to all activities"    Currently in Pain?  Yes    Pain Score  3     Pain Location  Shoulder    Pain Orientation  Right    Pain Descriptors / Indicators  Sore    Pain Type  Surgical pain    Pain Onset  More than a month ago    Pain Frequency  Intermittent         [facility] PT Assessment - 04/05/20 0001      Assessment   Medical Diagnosis  Right anatomic stemless total shoulder arthroplasty    Referring Provider (PT)  Ophelia Charter, MD    Onset Date/Surgical Date  01/25/20    Hand Dominance  Right    Next MD Visit  04/19/2020    Prior Therapy  no      Precautions   Precautions  Shoulder    Type of Shoulder Precautions  Follow Dr. Rich Fuchs TSA protocol                    [facility] Adult PT Treatment/Exercise - 04/05/20 0001      Exercises   Exercises  Shoulder      Shoulder Exercises: Pulleys   Flexion  5 minutes      Shoulder Exercises:  ROM/Strengthening   Other ROM/Strengthening Exercises  wall ladder x3 mins level 24      Modalities   Modalities  Electrical Stimulation;Vasopneumatic      Electrical Stimulation   Electrical Stimulation Location  R shoulder    Electrical Stimulation Action  IFC    Electrical Stimulation Parameters  80-150 hz x10 mins    Electrical Stimulation Goals  Pain      Vasopneumatic   Number Minutes Vasopneumatic   10 minutes    Vasopnuematic Location   Shoulder    Vasopneumatic Pressure  Low    Vasopneumatic Temperature   49      Manual Therapy   Manual Therapy  Passive ROM;Soft tissue mobilization    Soft tissue mobilization  STW to R deltoids, bicep to reduce muscle tightness and pain    Passive ROM  PROM of R shoulder into flexion and ER with holds at end range; Rhythmic stabilization in 90 degrees flexion and ~110 degrees; rhythmc stab for ER only.                   PT Long Term Goals - 03/24/20 0740      PT LONG TERM GOAL #1   Title  Patient will be  independent with HEP    Time  8    Period  Weeks    Status  Achieved      PT LONG TERM GOAL #2   Title  Patient will report ability to perform ADLs with right shoulder pain less than or equal to 3/10.    Time  8    Period  Weeks    Status  On-going      PT LONG TERM GOAL #3   Title  Patient will demonstrate 55+ degrees of right shoulder ER AROM to improve donning and doffing apparel.    Time  8    Period  Weeks    Status  On-going      PT LONG TERM GOAL #4   Title  Patient will demonstrate 140+ degrees of right shoulder flexion AROM to improve overhead activities.    Time  8    Period  Weeks    Status  On-going      PT LONG TERM GOAL #5   Title  Patient will demonstrate 4/5 or greater of right shoulder MMT in all planes to improve stability during functional tasks.    Time  8    Period  Weeks    Status  On-going            Plan - 04/05/20 [id number]    Clinical Impression Statement  Patient responded fairly well but still with pain at end range PROM into flexion and ER. Patient was able to tolerate rhythmic stabilization to improve strength with no complaints of pain. Moderate tightness noted in R delotid with reports of tenderness during STW/M to alleviate pain. No adverse affects upon removal of modalities.    Personal Factors and Comorbidities  Comorbidity 2    Comorbidities  right TSA 01/25/2020, HTN, DM    Examination-Activity Limitations  Dressing;Hygiene/Grooming;Toileting;Reach Overhead;Carry;Lift    Stability/Clinical Decision Making  Stable/Uncomplicated    Clinical Decision Making  Low    Rehab Potential  Good    PT Frequency  3x / week    PT Duration  8 weeks    PT Treatment/Interventions  ADLs/Self Care Home Management;Cryotherapy;Electrical Stimulation;Moist Heat;Iontophoresis 4mg /ml Dexamethasone;Neuromuscular re-education;Manual techniques;Passive range of motion;Therapeutic exercise;Therapeutic activities;Functional mobility training;Patient/family  education;Vasopneumatic Device  PT Next Visit Plan  continue isometrics (with exceptions of ext and IR per protocol) PROM to right shoulder, continue with pulleys, seated ranger, modalities PRN for pain relief.    PT Home Exercise Plan  see patient education section    Consulted and Agree with Plan of Care  Patient       Patient will benefit from skilled therapeutic intervention in order to improve the following deficits and impairments:  Decreased activity tolerance, Decreased range of motion, Decreased strength, Postural dysfunction, Pain, Impaired UE functional use, Decreased scar mobility  Visit Diagnosis: Acute pain of right shoulder  Stiffness of right shoulder, not elsewhere classified     Problem List Patient Active Problem List   Diagnosis Date Noted  . Glenoid fracture of shoulder 01/25/2020  . Diabetes mellitus ([place]) 12/09/2019  . COVID-19 12/01/2019  . H/O: hysterectomy 10/23/2018  . History of C-section 10/23/2018  . T2DM (type 2 diabetes mellitus) ([place]) 09/20/2017  . Morbid obesity with BMI of 40.0-44.9, adult ([place]) 02/13/2017  . Hypertension associated with type 2 diabetes mellitus ([place]) 02/14/2015    Torrie Mayers Yara Tomkinson 04/05/2020, 8:21 AM  [facility] [place], [place], [address] Phone: [phone]   Fax:  [phone]  Name: SHARMELL MEIS MRN: [id number] Date of Birth: 10-26-1966

## 2020-04-08 ENCOUNTER — Encounter: Payer: Self-pay | Admitting: Physical Therapy

## 2020-04-08 ENCOUNTER — Ambulatory Visit: Payer: BC Managed Care – PPO | Admitting: Physical Therapy

## 2020-04-08 ENCOUNTER — Other Ambulatory Visit: Payer: Self-pay

## 2020-04-08 DIAGNOSIS — M25611 Stiffness of right shoulder, not elsewhere classified: Secondary | ICD-10-CM

## 2020-04-08 DIAGNOSIS — M25511 Pain in right shoulder: Secondary | ICD-10-CM | POA: Diagnosis not present

## 2020-04-08 NOTE — Therapy (Signed)
[facility] [place], [place], [address] Phone: [phone]   Fax:  [phone]  Physical Therapy Treatment  Patient Details  Name: Brandy Hernandez MRN: [id number] Date of Birth: 1965/12/26 Referring Provider (PT): Ophelia Charter, MD   Encounter Date: 04/08/2020  PT End of Session - 04/08/20 0740    Visit Number  28    Number of Visits  30    Date for PT Re-Evaluation  05/04/20    Authorization Type  FOTO (20th visit: 45% limitation); Progress note every 10th visit    PT Start Time  0730    PT Stop Time  0824    PT Time Calculation (min)  54 min    Activity Tolerance  Patient tolerated treatment well    Behavior During Therapy  [facility] for tasks assessed/performed       Past Medical History:  Diagnosis Date  . Basal cell carcinoma   . COVID-19   . Diabetes mellitus without complication ([place])   . Hypertension     Past Surgical History:  Procedure Laterality Date  . ABDOMINAL HYSTERECTOMY    . CESAREAN SECTION    . COLONOSCOPY N/A 01/19/2019   Procedure: COLONOSCOPY;  Surgeon: Danie Binder, MD;  [facility];  Service: Endoscopy;  Laterality: N/A;  9:30  . ENDOMETRIAL ABLATION    . POLYPECTOMY  01/19/2019   Procedure: POLYPECTOMY;  Surgeon: Danie Binder, MD;  [facility];  Service: Endoscopy;;  transverse colon   . REVERSE SHOULDER ARTHROPLASTY Right 01/25/2020   Procedure: TOTAL SHOULDER ARTHROPLASTY;  Surgeon: Hiram Gash, MD;  [facility];  Service: Orthopedics;  Laterality: Right;  . SKIN CANCER EXCISION  2018    There were no vitals filed for this visit.  Subjective Assessment - 04/08/20 0735    Subjective  COVID-19 screening performed upon arrival. Feels the weather is playing a part of her pain.    Pertinent History  R TSA, 01/25/2020, HTN, DM    Limitations  Lifting;House hold activities    Diagnostic tests  x-ray    Patient Stated Goals  "return to all activities"    Currently in Pain?  Yes    Pain Score  3     Pain Location  Shoulder    Pain Orientation  Right    Pain Descriptors / Indicators  Sore    Pain Type  Surgical pain    Pain Onset  More than a month ago         [facility] PT Assessment - 04/08/20 0001      Assessment   Medical Diagnosis  Right anatomic stemless total shoulder arthroplasty    Referring Provider (PT)  Ophelia Charter, MD    Onset Date/Surgical Date  01/25/20    Hand Dominance  Right    Next MD Visit  04/19/2020    Prior Therapy  no      Precautions   Precautions  Shoulder    Type of Shoulder Precautions  Follow Dr. Rich Fuchs TSA protocol                    [facility] Adult PT Treatment/Exercise - 04/08/20 0001      Exercises   Exercises  Shoulder      Shoulder Exercises: Standing   Flexion  AAROM;AROM;5 reps;10 reps   x5 R UE only, x5 with bilateral AAROM; Jar to bottom shelf     Shoulder Exercises: Pulleys   Flexion  5 minutes      Shoulder Exercises: ROM/Strengthening   UBE (Upper Arm Bike)  120 RPM 6 mins, 3 fwd, 3 bwd AAROM    Ranger  standing ranger flexion, CW & CCW circles x3 mins each      Modalities   Modalities  Psychologist, educational Location  R shoulder    Electrical Stimulation Action  IFC    Electrical Stimulation Parameters  80-150 hz x10 mins    Electrical Stimulation Goals  Pain      Vasopneumatic   Number Minutes Vasopneumatic   10 minutes    Vasopnuematic Location   Shoulder    Vasopneumatic Pressure  Low    Vasopneumatic Temperature   34      Manual Therapy   Manual Therapy  Passive ROM;Soft tissue mobilization    Soft tissue mobilization  STW to R deltoids, bicep to reduce muscle tightness and pain    Passive ROM  PROM of R shoulder into flexion and ER with holds at end range; Rhythmic stabilization in 90 degrees flexion and ~110 degrees; rhythmc stab for ER only.                   PT Long Term  Goals - 03/24/20 0740      PT LONG TERM GOAL #1   Title  Patient will be independent with HEP    Time  8    Period  Weeks    Status  Achieved      PT LONG TERM GOAL #2   Title  Patient will report ability to perform ADLs with right shoulder pain less than or equal to 3/10.    Time  8    Period  Weeks    Status  On-going      PT LONG TERM GOAL #3   Title  Patient will demonstrate 55+ degrees of right shoulder ER AROM to improve donning and doffing apparel.    Time  8    Period  Weeks    Status  On-going      PT LONG TERM GOAL #4   Title  Patient will demonstrate 140+ degrees of right shoulder flexion AROM to improve overhead activities.    Time  8    Period  Weeks    Status  On-going      PT LONG TERM GOAL #5   Title  Patient will demonstrate 4/5 or greater of right shoulder MMT in all planes to improve stability during functional tasks.    Time  8    Period  Weeks    Status  On-going            Plan - 04/08/20 0825    Clinical Impression Statement  Patient responded fairly well but with ongoing UT compensayory motions into flexion. Patient responded better with assist and with improved form and technique. Patient instructed to begin AAROM shoulder flexion in reclined position to improve movements against gravity. No adverse affects upon removal of modalities.    Personal Factors and Comorbidities  Comorbidity 2    Comorbidities  right TSA 01/25/2020, HTN, DM    Examination-Activity Limitations  Dressing;Hygiene/Grooming;Toileting;Reach Overhead;Carry;Lift    Stability/Clinical Decision Making  Stable/Uncomplicated    Clinical Decision Making  Low    Rehab Potential  Good    PT Frequency  3x / week    PT Duration  8 weeks    PT Treatment/Interventions  ADLs/Self  Care Home Management;Cryotherapy;Electrical Stimulation;Moist Heat;Iontophoresis 4mg /ml Dexamethasone;Neuromuscular re-education;Manual techniques;Passive range of motion;Therapeutic exercise;Therapeutic  activities;Functional mobility training;Patient/family education;Vasopneumatic Device    PT Next Visit Plan  continue isometrics (with exceptions of ext and IR per protocol) and AAROM to R shoulder,  PROM to right shoulder, modalities PRN for pain relief.    PT Home Exercise Plan  see patient education section    Consulted and Agree with Plan of Care  Patient       Patient will benefit from skilled therapeutic intervention in order to improve the following deficits and impairments:  Decreased activity tolerance, Decreased range of motion, Decreased strength, Postural dysfunction, Pain, Impaired UE functional use, Decreased scar mobility  Visit Diagnosis: Acute pain of right shoulder  Stiffness of right shoulder, not elsewhere classified     Problem List Patient Active Problem List   Diagnosis Date Noted  . Glenoid fracture of shoulder 01/25/2020  . Diabetes mellitus ([place]) 12/09/2019  . COVID-19 12/01/2019  . H/O: hysterectomy 10/23/2018  . History of C-section 10/23/2018  . T2DM (type 2 diabetes mellitus) ([place]) 09/20/2017  . Morbid obesity with BMI of 40.0-44.9, adult ([place]) 02/13/2017  . Hypertension associated with type 2 diabetes mellitus ([place]) 02/14/2015    Gabriela Eves, PT, DPT 04/08/2020, 8:38 AM  [facility] [address] [place], [place], [address] Phone: [phone]   Fax:  [phone]  Name: ELLIANAH CORDY MRN: [id number] Date of Birth: 07/02/1966

## 2020-04-11 ENCOUNTER — Ambulatory Visit: Payer: BC Managed Care – PPO | Admitting: Physical Therapy

## 2020-04-11 ENCOUNTER — Encounter: Payer: Self-pay | Admitting: Physical Therapy

## 2020-04-11 ENCOUNTER — Other Ambulatory Visit: Payer: Self-pay

## 2020-04-11 DIAGNOSIS — M25611 Stiffness of right shoulder, not elsewhere classified: Secondary | ICD-10-CM

## 2020-04-11 DIAGNOSIS — M25511 Pain in right shoulder: Secondary | ICD-10-CM | POA: Diagnosis not present

## 2020-04-11 NOTE — Therapy (Signed)
[facility] [place], [place], [address] Phone: [phone]   Fax:  [phone]  Physical Therapy Treatment  Patient Details  Name: Brandy Hernandez MRN: [id number] Date of Birth: May 09, 1966 Referring Provider (PT): Ophelia Charter, MD   Encounter Date: 04/11/2020  PT End of Session - 04/11/20 0737    Visit Number  29    Number of Visits  42    Date for PT Re-Evaluation  05/20/20    Authorization Type  FOTO (29th visit: 52% limitation); Progress note every 10th visit    PT Start Time  0731    PT Stop Time  0824    PT Time Calculation (min)  53 min    Activity Tolerance  Patient tolerated treatment well    Behavior During Therapy  [facility] for tasks assessed/performed       Past Medical History:  Diagnosis Date  . Basal cell carcinoma   . COVID-19   . Diabetes mellitus without complication ([place])   . Hypertension     Past Surgical History:  Procedure Laterality Date  . ABDOMINAL HYSTERECTOMY    . CESAREAN SECTION    . COLONOSCOPY N/A 01/19/2019   Procedure: COLONOSCOPY;  Surgeon: Danie Binder, MD;  [facility];  Service: Endoscopy;  Laterality: N/A;  9:30  . ENDOMETRIAL ABLATION    . POLYPECTOMY  01/19/2019   Procedure: POLYPECTOMY;  Surgeon: Danie Binder, MD;  [facility];  Service: Endoscopy;;  transverse colon   . REVERSE SHOULDER ARTHROPLASTY Right 01/25/2020   Procedure: TOTAL SHOULDER ARTHROPLASTY;  Surgeon: Hiram Gash, MD;  [facility]: [facility];  Service: Orthopedics;  Laterality: Right;  . SKIN CANCER EXCISION  2018    There were no vitals filed for this visit.  Subjective Assessment - 04/11/20 0734    Subjective  COVID-19 screening performed upon arrival. Patient reports a sharp nerve pain in her R shoulder that has been more frequent over the weekend.    Pertinent History  R TSA, 01/25/2020, HTN, DM    Limitations  Lifting;House hold activities    Diagnostic tests  x-ray    Patient  Stated Goals  "return to all activities"    Currently in Pain?  Yes    Pain Score  4     Pain Location  Shoulder    Pain Orientation  Right    Pain Descriptors / Indicators  Sharp    Pain Type  Surgical pain    Pain Onset  More than a month ago    Pain Frequency  Intermittent         [facility] PT Assessment - 04/11/20 0001      Assessment   Medical Diagnosis  Right anatomic stemless total shoulder arthroplasty    Referring Provider (PT)  Ophelia Charter, MD    Onset Date/Surgical Date  01/25/20    Hand Dominance  Right    Next MD Visit  04/19/2020    Prior Therapy  no      Precautions   Precautions  Shoulder    Type of Shoulder Precautions  Follow Dr. Rich Fuchs TSA protocol      AROM   Right Shoulder Flexion  63 Degrees   in sitting   Right Shoulder External Rotation  58 Degrees      PROM   Right Shoulder Flexion  145 Degrees    Right Shoulder External Rotation  63 Degrees  [place] Adult PT Treatment/Exercise - 04/11/20 0001      Exercises   Exercises  Shoulder      Shoulder Exercises: Standing   Flexion  AAROM;20 reps   Bilateral, Jar to bottom shelf     Shoulder Exercises: Pulleys   Flexion  5 minutes      Shoulder Exercises: ROM/Strengthening   UBE (Upper Arm Bike)  120 RPM 6 mins, 3 fwd, 3 bwd AAROM    Other ROM/Strengthening Exercises  wall ladder x3 mins level 24      Modalities   Modalities  Electrical Stimulation;Vasopneumatic      Electrical Stimulation   Electrical Stimulation Location  R shoulder    Electrical Stimulation Action  IFC    Electrical Stimulation Parameters  80-150 hz x10 mins    Electrical Stimulation Goals  Pain      Vasopneumatic   Number Minutes Vasopneumatic   10 minutes    Vasopnuematic Location   Shoulder    Vasopneumatic Pressure  Low    Vasopneumatic Temperature   34      Manual Therapy   Manual Therapy  Passive ROM;Soft tissue mobilization    Soft tissue mobilization  STW to R deltoids, bicep to  reduce muscle tightness and pain    Passive ROM  PROM of R shoulder into flexion and ER with holds at end range; Rhythmic stabilization in 90 degrees flexion and ~110 degrees; rhythmc stab for ER only.                   PT Long Term Goals - 04/11/20 3354      PT LONG TERM GOAL #1   Title  Patient will be independent with HEP    Time  8    Period  Weeks    Status  Achieved      PT LONG TERM GOAL #2   Title  Patient will report ability to perform ADLs with right shoulder pain less than or equal to 3/10.    Time  8    Period  Weeks    Status  On-going      PT LONG TERM GOAL #3   Title  Patient will demonstrate 55+ degrees of right shoulder ER AROM to improve donning and doffing apparel.    Time  8    Period  Weeks    Status  Achieved      PT LONG TERM GOAL #4   Title  Patient will demonstrate 140+ degrees of right shoulder flexion AROM to improve overhead activities.    Time  8    Period  Weeks    Status  On-going      PT LONG TERM GOAL #5   Title  Patient will demonstrate 4/5 or greater of right shoulder MMT in all planes to improve stability during functional tasks.    Time  8    Period  Weeks    Status  On-going            Plan - 04/11/20 5625    Clinical Impression Statement  Patinet responded fairly to therapy session secondary to pain. Patient still with significant pain at end ranges of AROM and PROM to R shoulder. See measurements. FOTO 52% limited; No adverse affects upon removal of modalities.    Personal Factors and Comorbidities  Comorbidity 2    Comorbidities  right TSA 01/25/2020, HTN, DM    Examination-Activity Limitations  Dressing;Hygiene/Grooming;Toileting;Reach Overhead;Carry;Lift    Stability/Clinical Decision Making  Stable/Uncomplicated  Clinical Decision Making  Low    Rehab Potential  Good    PT Frequency  3x / week    PT Duration  8 weeks    PT Treatment/Interventions  ADLs/Self Care Home Management;Cryotherapy;Electrical  Stimulation;Moist Heat;Iontophoresis 4mg /ml Dexamethasone;Neuromuscular re-education;Manual techniques;Passive range of motion;Therapeutic exercise;Therapeutic activities;Functional mobility training;Patient/family education;Vasopneumatic Device    PT Next Visit Plan  continue isometrics (with exceptions of ext and IR per protocol) and AAROM to R shoulder,  PROM to right shoulder, modalities PRN for pain relief.    PT Home Exercise Plan  see patient education section    Consulted and Agree with Plan of Care  Patient       Patient will benefit from skilled therapeutic intervention in order to improve the following deficits and impairments:  Decreased activity tolerance, Decreased range of motion, Decreased strength, Postural dysfunction, Pain, Impaired UE functional use, Decreased scar mobility  Visit Diagnosis: Acute pain of right shoulder  Stiffness of right shoulder, not elsewhere classified     Problem List Patient Active Problem List   Diagnosis Date Noted  . Glenoid fracture of shoulder 01/25/2020  . Diabetes mellitus ([place]) 12/09/2019  . COVID-19 12/01/2019  . H/O: hysterectomy 10/23/2018  . History of C-section 10/23/2018  . T2DM (type 2 diabetes mellitus) ([place]) 09/20/2017  . Morbid obesity with BMI of 40.0-44.9, adult ([place]) 02/13/2017  . Hypertension associated with type 2 diabetes mellitus ([place]) 02/14/2015    Gabriela Eves, PT, DPT 04/11/2020, 10:29 AM  [facility] [address] [place], [place], [address] Phone: [phone]   Fax:  [phone]  Name: Brandy Hernandez MRN: [id number] Date of Birth: 10/26/1966

## 2020-04-13 ENCOUNTER — Other Ambulatory Visit: Payer: Self-pay

## 2020-04-13 ENCOUNTER — Encounter: Payer: Self-pay | Admitting: Physical Therapy

## 2020-04-13 ENCOUNTER — Ambulatory Visit: Payer: BC Managed Care – PPO | Admitting: Physical Therapy

## 2020-04-13 DIAGNOSIS — M25511 Pain in right shoulder: Secondary | ICD-10-CM | POA: Diagnosis not present

## 2020-04-13 DIAGNOSIS — M25611 Stiffness of right shoulder, not elsewhere classified: Secondary | ICD-10-CM

## 2020-04-13 NOTE — Therapy (Signed)
[facility] [place], [place], [address] Phone: [phone]   Fax:  [phone]  Physical Therapy Treatment  Progress Note Reporting Period 03/21/2020 to 04/13/2020  See note below for Objective Data and Assessment of Progress/Goals. Patient progressing but difficulties with AAROM. Gabriela Eves, PT, DPT      Patient Details  Name: Brandy Hernandez MRN: [id number] Date of Birth: [birth year] Referring Provider (PT): Ophelia Charter, MD   Encounter Date: 04/13/2020  PT End of Session - 04/13/20 0755    Visit Number  30    Number of Visits  42    Date for PT Re-Evaluation  05/20/20    Authorization Type  FOTO (29th visit: 52% limitation); Progress note every 10th visit    PT Start Time  0731    PT Stop Time  0823    PT Time Calculation (min)  52 min    Activity Tolerance  Patient tolerated treatment well    Behavior During Therapy  [facility] for tasks assessed/performed       Past Medical History:  Diagnosis Date  . Basal cell carcinoma   . COVID-19   . Diabetes mellitus without complication ([place])   . Hypertension     Past Surgical History:  Procedure Laterality Date  . ABDOMINAL HYSTERECTOMY    . CESAREAN SECTION    . COLONOSCOPY N/A 01/19/2019   Procedure: COLONOSCOPY;  Surgeon: Danie Binder, MD;  [facility];  Service: Endoscopy;  Laterality: N/A;  9:30  . ENDOMETRIAL ABLATION    . POLYPECTOMY  01/19/2019   Procedure: POLYPECTOMY;  Surgeon: Danie Binder, MD;  [facility];  Service: Endoscopy;;  transverse colon   . REVERSE SHOULDER ARTHROPLASTY Right 01/25/2020   Procedure: TOTAL SHOULDER ARTHROPLASTY;  Surgeon: Hiram Gash, MD;  [facility];  Service: Orthopedics;  Laterality: Right;  . SKIN CANCER EXCISION  2018    There were no vitals filed for this visit.  Subjective Assessment - 04/13/20 0754    Subjective  COVID-19 screening performed upon arrival. Patient reports having the  best sleep last night since surgery.    Pertinent History  R TSA, 01/25/2020, HTN, DM    Limitations  Lifting;House hold activities    Diagnostic tests  x-ray    Patient Stated Goals  "return to all activities"    Currently in Pain?  Yes    Pain Score  1     Pain Location  Shoulder    Pain Orientation  Right    Pain Descriptors / Indicators  Sore    Pain Type  Surgical pain    Pain Onset  More than a month ago    Pain Frequency  Intermittent         [facility] PT Assessment - 04/13/20 0001      Assessment   Medical Diagnosis  Right anatomic stemless total shoulder arthroplasty    Referring Provider (PT)  Ophelia Charter, MD    Onset Date/Surgical Date  01/25/20    Hand Dominance  Right    Next MD Visit  04/19/2020    Prior Therapy  no      Precautions   Precautions  Shoulder    Type of Shoulder Precautions  Follow Dr. Rich Fuchs TSA protocol                    [facility] Adult PT Treatment/Exercise - 04/13/20 0001      Exercises   Exercises  Shoulder      Shoulder Exercises: Seated   Flexion  AAROM;20 reps   with PVC, in lawn chair position     Shoulder Exercises: Standing   Flexion  AAROM;20 reps   short lever, with prayer hands     Shoulder Exercises: Pulleys   Flexion  5 minutes      Shoulder Exercises: ROM/Strengthening   UBE (Upper Arm Bike)  120 RPM 8 mins, 4 fwd, 4 bwd AAROM      Modalities   Modalities  Electrical Stimulation;Vasopneumatic      Acupuncturist Location  R shoulder    Electrical Stimulation Action  IFC    Electrical Stimulation Parameters  80-150 hz x10 mins    Electrical Stimulation Goals  Pain      Vasopneumatic   Number Minutes Vasopneumatic   10 minutes    Vasopnuematic Location   Shoulder    Vasopneumatic Pressure  Low    Vasopneumatic Temperature   34      Manual Therapy   Manual Therapy  Passive ROM    Passive ROM  PROM of R shoulder into flexion and ER with holds at end range                   PT Long Term Goals - 04/11/20 8315      PT LONG TERM GOAL #1   Title  Patient will be independent with HEP    Time  8    Period  Weeks    Status  Achieved      PT LONG TERM GOAL #2   Title  Patient will report ability to perform ADLs with right shoulder pain less than or equal to 3/10.    Time  8    Period  Weeks    Status  On-going      PT LONG TERM GOAL #3   Title  Patient will demonstrate 55+ degrees of right shoulder ER AROM to improve donning and doffing apparel.    Time  8    Period  Weeks    Status  Achieved      PT LONG TERM GOAL #4   Title  Patient will demonstrate 140+ degrees of right shoulder flexion AROM to improve overhead activities.    Time  8    Period  Weeks    Status  On-going      PT LONG TERM GOAL #5   Title  Patient will demonstrate 4/5 or greater of right shoulder MMT in all planes to improve stability during functional tasks.    Time  8    Period  Weeks    Status  On-going            Plan - 04/13/20 1761    Clinical Impression Statement  Patient responded fairly well to AAROM exercises but with cues to for proper shoulder flexion technique. 5-6/10 pain rated with end range PROM. No adverse affects upon removal of modalties.    Personal Factors and Comorbidities  Comorbidity 2    Comorbidities  right TSA 01/25/2020, HTN, DM    Examination-Activity Limitations  Dressing;Hygiene/Grooming;Toileting;Reach Overhead;Carry;Lift    Stability/Clinical Decision Making  Stable/Uncomplicated    Clinical Decision Making  Low    Rehab Potential  Good    PT Frequency  3x / week    PT Duration  8 weeks    PT Treatment/Interventions  ADLs/Self Care Home Management;Cryotherapy;Electrical Stimulation;Moist Heat;Iontophoresis 4mg /ml Dexamethasone;Neuromuscular re-education;Manual techniques;Passive range of  motion;Therapeutic exercise;Therapeutic activities;Functional mobility training;Patient/family education;Vasopneumatic Device    PT  Next Visit Plan  continue isometrics (with exceptions of ext and IR per protocol) and AAROM to R shoulder,  PROM to right shoulder, modalities PRN for pain relief.    PT Home Exercise Plan  see patient education section    Consulted and Agree with Plan of Care  Patient       Patient will benefit from skilled therapeutic intervention in order to improve the following deficits and impairments:  Decreased activity tolerance, Decreased range of motion, Decreased strength, Postural dysfunction, Pain, Impaired UE functional use, Decreased scar mobility  Visit Diagnosis: Acute pain of right shoulder  Stiffness of right shoulder, not elsewhere classified     Problem List Patient Active Problem List   Diagnosis Date Noted  . Glenoid fracture of shoulder 01/25/2020  . Diabetes mellitus ([place]) 12/09/2019  . COVID-19 12/01/2019  . H/O: hysterectomy 10/23/2018  . History of C-section 10/23/2018  . T2DM (type 2 diabetes mellitus) ([place]) 09/20/2017  . Morbid obesity with BMI of 40.0-44.9, adult ([place]) 02/13/2017  . Hypertension associated with type 2 diabetes mellitus ([place]) 02/14/2015    Gabriela Eves, PT, DPT 04/13/2020, 1:05 PM  [facility] [address] [place], [place], [address] Phone: [phone]   Fax:  [phone]  Name: Brandy Hernandez MRN: [id number] Date of Birth: [birth year]

## 2020-04-15 ENCOUNTER — Encounter: Payer: Self-pay | Admitting: Physical Therapy

## 2020-04-15 ENCOUNTER — Other Ambulatory Visit: Payer: Self-pay

## 2020-04-15 ENCOUNTER — Ambulatory Visit: Payer: BC Managed Care – PPO | Admitting: Physical Therapy

## 2020-04-15 DIAGNOSIS — M25611 Stiffness of right shoulder, not elsewhere classified: Secondary | ICD-10-CM

## 2020-04-15 DIAGNOSIS — M25511 Pain in right shoulder: Secondary | ICD-10-CM

## 2020-04-15 NOTE — Therapy (Signed)
[facility] [place], [place], [address] Phone: [phone]   Fax:  [phone]  Physical Therapy Treatment  Patient Details  Name: Brandy Hernandez MRN: [id number] Date of Birth: [birth year] Referring Provider (PT): Ophelia Charter, MD   Encounter Date: 04/15/2020   PT End of Session - 04/15/20 0742    Visit Number 31    Number of Visits 42    Date for PT Re-Evaluation 05/20/20    Authorization Type FOTO (29th visit: 52% limitation); Progress note every 10th visit    PT Start Time [phone]    Activity Tolerance Patient tolerated treatment well    Behavior During Therapy [facility] for tasks assessed/performed           Past Medical History:  Diagnosis Date  . Basal cell carcinoma   . COVID-19   . Diabetes mellitus without complication ([place])   . Hypertension     Past Surgical History:  Procedure Laterality Date  . ABDOMINAL HYSTERECTOMY    . CESAREAN SECTION    . COLONOSCOPY N/A 01/19/2019   Procedure: COLONOSCOPY;  Surgeon: Danie Binder, MD;  [facility];  Service: Endoscopy;  Laterality: N/A;  9:30  . ENDOMETRIAL ABLATION    . POLYPECTOMY  01/19/2019   Procedure: POLYPECTOMY;  Surgeon: Danie Binder, MD;  [facility];  Service: Endoscopy;;  transverse colon   . REVERSE SHOULDER ARTHROPLASTY Right 01/25/2020   Procedure: TOTAL SHOULDER ARTHROPLASTY;  Surgeon: Hiram Gash, MD;  [facility];  Service: Orthopedics;  Laterality: Right;  . SKIN CANCER EXCISION  2018    There were no vitals filed for this visit.   Subjective Assessment - 04/15/20 0741    Subjective COVID-19 screening performed upon arrival. Patient reports no new complaints    Pertinent History R TSA, 01/25/2020, HTN, DM    Limitations Lifting;House hold activities    Diagnostic tests x-ray    Patient Stated Goals "return to all activities"    Currently in Pain? Yes    Pain Score 1     Pain Location Shoulder    Pain  Orientation Right    Pain Descriptors / Indicators Sore    Pain Type Surgical pain    Pain Onset More than a month ago    Pain Frequency Intermittent              [facility] PT Assessment - 04/15/20 0001      Assessment   Medical Diagnosis Right anatomic stemless total shoulder arthroplasty    Referring Provider (PT) Ophelia Charter, MD    Onset Date/Surgical Date 01/25/20    Hand Dominance Right    Next MD Visit 04/19/2020    Prior Therapy no      Precautions   Precautions Shoulder    Type of Shoulder Precautions Follow Dr. Rich Fuchs TSA protocol                         [facility] Adult PT Treatment/Exercise - 04/15/20 0001      Exercises   Exercises Shoulder      Shoulder Exercises: Standing   Flexion AAROM;20 reps   with prayer hands     Shoulder Exercises: Pulleys   Flexion 5 minutes      Shoulder Exercises: ROM/Strengthening   Ranger standing ranger flexion, CW & CCW circles x3 mins each    Other ROM/Strengthening Exercises wall ladder x3 mins level 24  Modalities   Modalities Location manager Action IFC    Electrical Stimulation Parameters 80-150 hz x10 mins    Electrical Stimulation Goals Pain      Vasopneumatic   Number Minutes Vasopneumatic  10 minutes    Vasopnuematic Location  Shoulder    Vasopneumatic Pressure Low    Vasopneumatic Temperature  34      Manual Therapy   Manual Therapy Passive ROM    Passive ROM PROM of R shoulder into flexion and ER with holds at end range                       PT Long Term Goals - 04/11/20 1423      PT LONG TERM GOAL #1   Title Patient will be independent with HEP    Time 8    Period Weeks    Status Achieved      PT LONG TERM GOAL #2   Title Patient will report ability to perform ADLs with right shoulder pain less than or equal to 3/10.    Time 8    Period Weeks     Status On-going      PT LONG TERM GOAL #3   Title Patient will demonstrate 55+ degrees of right shoulder ER AROM to improve donning and doffing apparel.    Time 8    Period Weeks    Status Achieved      PT LONG TERM GOAL #4   Title Patient will demonstrate 140+ degrees of right shoulder flexion AROM to improve overhead activities.    Time 8    Period Weeks    Status On-going      PT LONG TERM GOAL #5   Title Patient will demonstrate 4/5 or greater of right shoulder MMT in all planes to improve stability during functional tasks.    Time 8    Period Weeks    Status On-going                 Plan - 04/15/20 0957    Clinical Impression Statement Patient responded well to therapy session. Patient demonstrated less R UT compensation but begins to show especially with fatigue. Patient reiterated performing HEP more frequently during the day to maximize and improve AROM. Patient reported understanding. No adverse affects upon removal of modalities.    Personal Factors and Comorbidities Comorbidity 2    Comorbidities right TSA 01/25/2020, HTN, DM    Examination-Activity Limitations Dressing;Hygiene/Grooming;Toileting;Reach Overhead;Carry;Lift    Stability/Clinical Decision Making Stable/Uncomplicated    Clinical Decision Making Low    Rehab Potential Good    PT Frequency 3x / week    PT Duration 8 weeks    PT Treatment/Interventions ADLs/Self Care Home Management;Cryotherapy;Electrical Stimulation;Moist Heat;Iontophoresis 4mg /ml Dexamethasone;Neuromuscular re-education;Manual techniques;Passive range of motion;Therapeutic exercise;Therapeutic activities;Functional mobility training;Patient/family education;Vasopneumatic Device    PT Next Visit Plan MD note; continue isometrics (with exceptions of ext and IR per protocol) and AAROM to R shoulder,  PROM to right shoulder, modalities PRN for pain relief.    PT Home Exercise Plan see patient education section    Consulted and Agree with  Plan of Care Patient           Patient will benefit from skilled therapeutic intervention in order to improve the following deficits and impairments:  Decreased activity tolerance, Decreased range of motion, Decreased strength, Postural dysfunction, Pain,  Impaired UE functional use, Decreased scar mobility  Visit Diagnosis: Acute pain of right shoulder  Stiffness of right shoulder, not elsewhere classified     Problem List Patient Active Problem List   Diagnosis Date Noted  . Glenoid fracture of shoulder 01/25/2020  . Diabetes mellitus ([place]) 12/09/2019  . COVID-19 12/01/2019  . H/O: hysterectomy 10/23/2018  . History of C-section 10/23/2018  . T2DM (type 2 diabetes mellitus) ([place]) 09/20/2017  . Morbid obesity with BMI of 40.0-44.9, adult ([place]) 02/13/2017  . Hypertension associated with type 2 diabetes mellitus ([place]) 02/14/2015    Gabriela Eves, PT, DPT 04/15/2020, 12:12 PM  [facility] [address] [place], [place], [address] Phone: [phone]   Fax:  [phone]  Name: Brandy Hernandez MRN: [id number] Date of Birth: 31-Dec-1965

## 2020-04-18 ENCOUNTER — Other Ambulatory Visit: Payer: Self-pay

## 2020-04-18 ENCOUNTER — Encounter: Payer: Self-pay | Admitting: Physical Therapy

## 2020-04-18 ENCOUNTER — Ambulatory Visit: Payer: BC Managed Care – PPO | Admitting: Physical Therapy

## 2020-04-18 DIAGNOSIS — M25611 Stiffness of right shoulder, not elsewhere classified: Secondary | ICD-10-CM

## 2020-04-18 DIAGNOSIS — M25511 Pain in right shoulder: Secondary | ICD-10-CM | POA: Diagnosis not present

## 2020-04-18 NOTE — Therapy (Signed)
[facility] [place], [place], [address] Phone: [phone]   Fax:  [phone]  Physical Therapy Treatment  Patient Details  Name: Brandy Hernandez MRN: [id number] Date of Birth: Nov 10, 1965 Referring Provider (PT): Ophelia Charter, MD   Encounter Date: 04/18/2020   PT End of Session - 04/18/20 0742    Visit Number 32    Number of Visits 42    Date for PT Re-Evaluation 05/20/20    Authorization Type FOTO (29th visit: 52% limitation); Progress note every 10th visit    PT Start Time 0731    PT Stop Time 0826    PT Time Calculation (min) 55 min    Activity Tolerance Patient tolerated treatment well    Behavior During Therapy [facility] for tasks assessed/performed           Past Medical History:  Diagnosis Date  . Basal cell carcinoma   . COVID-19   . Diabetes mellitus without complication ([place])   . Hypertension     Past Surgical History:  Procedure Laterality Date  . ABDOMINAL HYSTERECTOMY    . CESAREAN SECTION    . COLONOSCOPY N/A 01/19/2019   Procedure: COLONOSCOPY;  Surgeon: Danie Binder, MD;  [facility];  Service: Endoscopy;  Laterality: N/A;  9:30  . ENDOMETRIAL ABLATION    . POLYPECTOMY  01/19/2019   Procedure: POLYPECTOMY;  Surgeon: Danie Binder, MD;  [facility];  Service: Endoscopy;;  transverse colon   . REVERSE SHOULDER ARTHROPLASTY Right 01/25/2020   Procedure: TOTAL SHOULDER ARTHROPLASTY;  Surgeon: Hiram Gash, MD;  [facility]: [facility];  Service: Orthopedics;  Laterality: Right;  . SKIN CANCER EXCISION  2018    There were no vitals filed for this visit.   Subjective Assessment - 04/18/20 0741    Subjective COVID-19 screening performed upon arrival. Patient reports the weather gets to her. Reports no pain at rest ut about 3/10 with movement in biceps region.    Pertinent History R TSA, 01/25/2020, HTN, DM    Limitations Lifting;House hold activities    Diagnostic tests x-ray     Patient Stated Goals "return to all activities"    Currently in Pain? Yes    Pain Score 3     Pain Location Shoulder    Pain Orientation Right    Pain Descriptors / Indicators Sore    Pain Type Surgical pain    Pain Onset More than a month ago    Pain Frequency Intermittent              [facility] PT Assessment - 04/18/20 0001      Assessment   Medical Diagnosis Right anatomic stemless total shoulder arthroplasty    Referring Provider (PT) Ophelia Charter, MD    Onset Date/Surgical Date 01/25/20    Hand Dominance Right    Next MD Visit 04/19/2020    Prior Therapy no      Precautions   Precautions Shoulder    Type of Shoulder Precautions Follow Dr. Rich Fuchs TSA protocol      AROM   Overall AROM Comments in supine    Right Shoulder Flexion 113 Degrees    Right Shoulder External Rotation 50 Degrees      PROM   Right Shoulder Flexion 133 Degrees    Right Shoulder Internal Rotation 49 Degrees    Right Shoulder External Rotation 62 Degrees  [place] Adult PT Treatment/Exercise - 04/18/20 0001      Exercises   Exercises Shoulder      Shoulder Exercises: Seated   Flexion AAROM;20 reps;10 reps   2x15 in lawn chair position     Shoulder Exercises: Pulleys   Flexion 5 minutes      Shoulder Exercises: ROM/Strengthening   UBE (Upper Arm Bike) 120 RPM 8 mins, 4 fwd, 4 bwd AAROM      Modalities   Modalities Psychologist, educational Location R shoulder    Electrical Stimulation Action IFC    Electrical Stimulation Parameters 80-150 hz x10 mins    Electrical Stimulation Goals Pain      Vasopneumatic   Number Minutes Vasopneumatic  10 minutes    Vasopnuematic Location  Shoulder    Vasopneumatic Pressure Low    Vasopneumatic Temperature  34      Manual Therapy   Manual Therapy Passive ROM    Passive ROM PROM of R shoulder into flexion ER, IR with holds at end range                        PT Long Term Goals - 04/18/20 0834      PT LONG TERM GOAL #1   Title Patient will be independent with HEP    Time 8    Period Weeks    Status Achieved      PT LONG TERM GOAL #2   Title Patient will report ability to perform ADLs with right shoulder pain less than or equal to 3/10.    Time 8    Period Weeks    Status On-going      PT LONG TERM GOAL #3   Title Patient will demonstrate 55+ degrees of right shoulder ER AROM to improve donning and doffing apparel.    Time 8    Period Weeks    Status Achieved      PT LONG TERM GOAL #4   Title Patient will demonstrate 140+ degrees of right shoulder flexion AROM to improve overhead activities.    Time 8    Period Weeks    Status On-going      PT LONG TERM GOAL #5   Title Patient will demonstrate 4/5 or greater of right shoulder MMT in all planes to improve stability during functional tasks.    Time 8    Period Weeks    Status On-going                 Plan - 04/18/20 2951    Clinical Impression Statement Patient arrives with ongoing discomfort in R shoulder. Patient with R UT compensatory motions particularly when fatigued. Patient has made some progress with AROM in supine but with pain. See objective measurements and goals. No adverse affects upon removal of modalitie.s    Personal Factors and Comorbidities Comorbidity 2    Comorbidities right TSA 01/25/2020, HTN, DM    Examination-Activity Limitations Dressing;Hygiene/Grooming;Toileting;Reach Overhead;Carry;Lift    Stability/Clinical Decision Making Stable/Uncomplicated    Clinical Decision Making Low    Rehab Potential Good    PT Frequency 3x / week    PT Duration 8 weeks    PT Treatment/Interventions ADLs/Self Care Home Management;Cryotherapy;Electrical Stimulation;Moist Heat;Iontophoresis 4mg /ml Dexamethasone;Neuromuscular re-education;Manual techniques;Passive range of motion;Therapeutic exercise;Therapeutic activities;Functional mobility  training;Patient/family education;Vasopneumatic Device    PT Next Visit Plan 12 weeks post op 04/18/2020, cont with AAROM to R shoulder,  PROM to right shoulder, modalities PRN for pain relief.    PT Home Exercise Plan see patient education section    Consulted and Agree with Plan of Care Patient           Patient will benefit from skilled therapeutic intervention in order to improve the following deficits and impairments:  Decreased activity tolerance, Decreased range of motion, Decreased strength, Postural dysfunction, Pain, Impaired UE functional use, Decreased scar mobility  Visit Diagnosis: Acute pain of right shoulder  Stiffness of right shoulder, not elsewhere classified     Problem List Patient Active Problem List   Diagnosis Date Noted  . Glenoid fracture of shoulder 01/25/2020  . Diabetes mellitus ([place]) 12/09/2019  . COVID-19 12/01/2019  . H/O: hysterectomy 10/23/2018  . History of C-section 10/23/2018  . T2DM (type 2 diabetes mellitus) ([place]) 09/20/2017  . Morbid obesity with BMI of 40.0-44.9, adult ([place]) 02/13/2017  . Hypertension associated with type 2 diabetes mellitus ([place]) 02/14/2015    Gabriela Eves, PT, DPT 04/18/2020, 8:59 AM  [facility] [address] [place], [place], [address] Phone: [phone]   Fax:  [phone]  Name: Brandy Hernandez MRN: [id number] Date of Birth: 1966-08-08

## 2020-04-20 ENCOUNTER — Other Ambulatory Visit: Payer: Self-pay

## 2020-04-20 ENCOUNTER — Ambulatory Visit: Payer: BC Managed Care – PPO | Admitting: Physical Therapy

## 2020-04-20 DIAGNOSIS — M25511 Pain in right shoulder: Secondary | ICD-10-CM | POA: Diagnosis not present

## 2020-04-20 DIAGNOSIS — M25611 Stiffness of right shoulder, not elsewhere classified: Secondary | ICD-10-CM

## 2020-04-20 NOTE — Therapy (Signed)
[facility] [place], [place], [address] Phone: [phone]   Fax:  [phone]  Physical Therapy Treatment  Patient Details  Name: Brandy Hernandez MRN: [id number] Date of Birth: [birth year] Referring Provider (PT): Ophelia Charter, MD   Encounter Date: 04/20/2020   PT End of Session - 04/20/20 0750    Visit Number 33    Number of Visits 42    Date for PT Re-Evaluation 05/20/20    Authorization Type FOTO (29th visit: 52% limitation); Progress note every 10th visit    PT Start Time 0730    PT Stop Time 0826    PT Time Calculation (min) 56 min    Activity Tolerance Patient tolerated treatment well    Behavior During Therapy [facility] for tasks assessed/performed           Past Medical History:  Diagnosis Date   Basal cell carcinoma    COVID-19    Diabetes mellitus without complication ([place])    Hypertension     Past Surgical History:  Procedure Laterality Date   ABDOMINAL HYSTERECTOMY     CESAREAN SECTION     COLONOSCOPY N/A 01/19/2019   Procedure: COLONOSCOPY;  Surgeon: Danie Binder, MD;  [facility];  Service: Endoscopy;  Laterality: N/A;  9:30   ENDOMETRIAL ABLATION     POLYPECTOMY  01/19/2019   Procedure: POLYPECTOMY;  Surgeon: Danie Binder, MD;  [facility];  Service: Endoscopy;;  transverse colon    REVERSE SHOULDER ARTHROPLASTY Right 01/25/2020   Procedure: TOTAL SHOULDER ARTHROPLASTY;  Surgeon: Hiram Gash, MD;  [facility];  Service: Orthopedics;  Laterality: Right;   SKIN CANCER EXCISION  2018    There were no vitals filed for this visit.   Subjective Assessment - 04/20/20 0749    Subjective COVID-19 screening performed upon arrival. Patient reports her MD appointment went well and she can begin strengthening. Reports she does not have another appointment until her year anniversary of her surgery. Minimal pain right now.    Pertinent History R TSA, 01/25/2020, HTN, DM     Limitations Lifting;House hold activities    Diagnostic tests x-ray    Patient Stated Goals "return to all activities"    Currently in Pain? Yes    Pain Score 1     Pain Location Shoulder    Pain Orientation Right    Pain Descriptors / Indicators Sore    Pain Type Surgical pain    Pain Onset More than a month ago    Pain Frequency Intermittent              [facility] PT Assessment - 04/20/20 0001      Assessment   Medical Diagnosis Right anatomic stemless total shoulder arthroplasty    Referring Provider (PT) Ophelia Charter, MD    Onset Date/Surgical Date 01/25/20    Hand Dominance Right    Next MD Visit "9 months"    Prior Therapy no      Precautions   Precautions Shoulder    Type of Shoulder Precautions Follow Dr. Rich Fuchs TSA protocol                         [facility] Adult PT Treatment/Exercise - 04/20/20 0001      Exercises   Exercises Shoulder      Shoulder Exercises: Pulleys   Flexion 5 minutes      Shoulder Exercises: ROM/Strengthening   UBE (Upper Arm  Bike) 120 RPM 8 mins, 4 fwd, 4 bwd AAROM    Ranger standing ranger flexion, CW & CCW circles x3 mins each    Other ROM/Strengthening Exercises wall ladder x3 mins level 26      Shoulder Exercises: Isometric Strengthening   Flexion 5X10"    Extension 5X10"    External Rotation 5X10"    Internal Rotation 5X10"    ABduction 5X10"    ADduction 5X10"      Modalities   Modalities Electrical Stimulation;Vasopneumatic      Electrical Stimulation   Electrical Stimulation Location R shoulder    Electrical Stimulation Action IFC    Electrical Stimulation Parameters 80-150 hz x10 mins    Electrical Stimulation Goals Pain      Vasopneumatic   Number Minutes Vasopneumatic  10 minutes    Vasopnuematic Location  Shoulder    Vasopneumatic Pressure Low    Vasopneumatic Temperature  34                       PT Long Term Goals - 04/18/20 0834      PT LONG TERM GOAL #1   Title Patient will  be independent with HEP    Time 8    Period Weeks    Status Achieved      PT LONG TERM GOAL #2   Title Patient will report ability to perform ADLs with right shoulder pain less than or equal to 3/10.    Time 8    Period Weeks    Status On-going      PT LONG TERM GOAL #3   Title Patient will demonstrate 55+ degrees of right shoulder ER AROM to improve donning and doffing apparel.    Time 8    Period Weeks    Status Achieved      PT LONG TERM GOAL #4   Title Patient will demonstrate 140+ degrees of right shoulder flexion AROM to improve overhead activities.    Time 8    Period Weeks    Status On-going      PT LONG TERM GOAL #5   Title Patient will demonstrate 4/5 or greater of right shoulder MMT in all planes to improve stability during functional tasks.    Time 8    Period Weeks    Status On-going                 Plan - 04/20/20 0817    Clinical Impression Statement Patient responded fairly to therapy session with increasing pain in biceps and deltoid region. Patient still at [facility] phase as she is still limited with performing AAROM with good form and technique. Isometric exercises progressed but with ongoing pain. No adverse affects upon removal of modalities.    Personal Factors and Comorbidities Comorbidity 2    Comorbidities right TSA 01/25/2020, HTN, DM    Examination-Activity Limitations Dressing;Hygiene/Grooming;Toileting;Reach Overhead;Carry;Lift    Stability/Clinical Decision Making Stable/Uncomplicated    Clinical Decision Making Low    Rehab Potential Good    PT Frequency 3x / week    PT Duration 8 weeks    PT Treatment/Interventions ADLs/Self Care Home Management;Cryotherapy;Electrical Stimulation;Moist Heat;Iontophoresis 4mg /ml Dexamethasone;Neuromuscular re-education;Manual techniques;Passive range of motion;Therapeutic exercise;Therapeutic activities;Functional mobility training;Patient/family education;Vasopneumatic Device    PT Next Visit Plan 12 weeks  post op 04/18/2020, cont with AAROM to R shoulder,  PROM to right shoulder, modalities PRN for pain relief.    PT Home Exercise Plan see patient education section  Consulted and Agree with Plan of Care Patient           Patient will benefit from skilled therapeutic intervention in order to improve the following deficits and impairments:  Decreased activity tolerance, Decreased range of motion, Decreased strength, Postural dysfunction, Pain, Impaired UE functional use, Decreased scar mobility  Visit Diagnosis: Acute pain of right shoulder  Stiffness of right shoulder, not elsewhere classified     Problem List Patient Active Problem List   Diagnosis Date Noted   Glenoid fracture of shoulder 01/25/2020   Diabetes mellitus ([place]) 12/09/2019   COVID-19 12/01/2019   H/O: hysterectomy 10/23/2018   History of C-section 10/23/2018   T2DM (type 2 diabetes mellitus) ([place]) 09/20/2017   Morbid obesity with BMI of 40.0-44.9, adult ([place]) 02/13/2017   Hypertension associated with type 2 diabetes mellitus ([place]) 02/14/2015    Gabriela Eves, PT, DPT 04/20/2020, 8:29 AM  [facility] [place], [place], [address] Phone: [phone]   Fax:  [phone]  Name: Brandy Hernandez MRN: [id number] Date of Birth: 10-19-1966

## 2020-04-25 ENCOUNTER — Encounter: Payer: Self-pay | Admitting: Physical Therapy

## 2020-04-25 ENCOUNTER — Ambulatory Visit: Payer: BC Managed Care – PPO | Admitting: Physical Therapy

## 2020-04-25 ENCOUNTER — Other Ambulatory Visit: Payer: Self-pay

## 2020-04-25 DIAGNOSIS — M25611 Stiffness of right shoulder, not elsewhere classified: Secondary | ICD-10-CM

## 2020-04-25 DIAGNOSIS — M25511 Pain in right shoulder: Secondary | ICD-10-CM | POA: Diagnosis not present

## 2020-04-25 NOTE — Therapy (Signed)
[facility] [place], [place], [address] Phone: [phone]   Fax:  [phone]  Physical Therapy Treatment  Patient Details  Name: Brandy Hernandez MRN: [id number] Date of Birth: Mar 30, 1966 Referring Provider (PT): Ophelia Charter, MD   Encounter Date: 04/25/2020   PT End of Session - 04/25/20 0746    Visit Number 34    Number of Visits 42    Date for PT Re-Evaluation 05/20/20    Authorization Type FOTO (29th visit: 52% limitation); Progress note every 10th visit    PT Start Time [phone]    PT Stop Time 0820    PT Time Calculation (min) 47 min    Activity Tolerance Patient tolerated treatment well    Behavior During Therapy [facility] for tasks assessed/performed           Past Medical History:  Diagnosis Date  . Basal cell carcinoma   . COVID-19   . Diabetes mellitus without complication ([place])   . Hypertension     Past Surgical History:  Procedure Laterality Date  . ABDOMINAL HYSTERECTOMY    . CESAREAN SECTION    . COLONOSCOPY N/A 01/19/2019   Procedure: COLONOSCOPY;  Surgeon: Danie Binder, MD;  [facility];  Service: Endoscopy;  Laterality: N/A;  9:30  . ENDOMETRIAL ABLATION    . POLYPECTOMY  01/19/2019   Procedure: POLYPECTOMY;  Surgeon: Danie Binder, MD;  [facility];  Service: Endoscopy;;  transverse colon   . REVERSE SHOULDER ARTHROPLASTY Right 01/25/2020   Procedure: TOTAL SHOULDER ARTHROPLASTY;  Surgeon: Hiram Gash, MD;  [facility];  Service: Orthopedics;  Laterality: Right;  . SKIN CANCER EXCISION  2018    There were no vitals filed for this visit.   Subjective Assessment - 04/25/20 0745    Subjective COVID-19 screening performed upon arrival. Patient reports more soreness in the biceps region. Patient states she still has pain with sleeping even with pillow under shoulder.    Pertinent History R TSA, 01/25/2020, HTN, DM    Limitations Lifting;House hold activities     Diagnostic tests x-ray    Patient Stated Goals "return to all activities"    Currently in Pain? Yes    Pain Score 2     Pain Location Shoulder    Pain Orientation Right    Pain Descriptors / Indicators Sore    Pain Type Surgical pain    Pain Onset More than a month ago    Pain Frequency Intermittent              [facility] PT Assessment - 04/25/20 0001      Assessment   Medical Diagnosis Right anatomic stemless total shoulder arthroplasty    Referring Provider (PT) Ophelia Charter, MD    Onset Date/Surgical Date 01/25/20    Hand Dominance Right    Next MD Visit "9 months"    Prior Therapy no      Precautions   Precautions Shoulder    Type of Shoulder Precautions Follow Dr. Rich Fuchs TSA protocol                         [facility] Adult PT Treatment/Exercise - 04/25/20 0001      Exercises   Exercises Shoulder      Shoulder Exercises: Standing   Other Standing Exercises Biceps curls 1# x2 mins      Shoulder Exercises: Pulleys   Flexion 5 minutes  Shoulder Exercises: ROM/Strengthening   UBE (Upper Arm Bike) 120 RPM 8 mins, 4 fwd, 4 bwd AAROM    Wall [place] into flexion x3 mins with AAROM      Modalities   Modalities Psychologist, educational Location R shoulder    Electrical Stimulation Action IFC    Electrical Stimulation Parameters 80-150 hz x10 mins    Electrical Stimulation Goals Pain      Vasopneumatic   Number Minutes Vasopneumatic  10 minutes    Vasopnuematic Location  Shoulder    Vasopneumatic Pressure Low    Vasopneumatic Temperature  34      Manual Therapy   Manual Therapy Passive ROM    Passive ROM PROM of R shoulder into flexion ER, IR with holds at end range                       PT Long Term Goals - 04/18/20 0834      PT LONG TERM GOAL #1   Title Patient will be independent with HEP    Time 8    Period Weeks    Status Achieved      PT LONG TERM GOAL #2    Title Patient will report ability to perform ADLs with right shoulder pain less than or equal to 3/10.    Time 8    Period Weeks    Status On-going      PT LONG TERM GOAL #3   Title Patient will demonstrate 55+ degrees of right shoulder ER AROM to improve donning and doffing apparel.    Time 8    Period Weeks    Status Achieved      PT LONG TERM GOAL #4   Title Patient will demonstrate 140+ degrees of right shoulder flexion AROM to improve overhead activities.    Time 8    Period Weeks    Status On-going      PT LONG TERM GOAL #5   Title Patient will demonstrate 4/5 or greater of right shoulder MMT in all planes to improve stability during functional tasks.    Time 8    Period Weeks    Status On-going                 Plan - 04/25/20 0813    Clinical Impression Statement Patient was able to complete treatment but with more pain in R biceps particularly with AAROM stating it feels like a stop when she gets to a certain range. Patient noted with a trigger point in the R biceps upon STW/M with reports of tenderness. Patient reiterated again the importance of HEP to maximize PT. No adverse affects upon removal of modalities.    Comorbidities right TSA 01/25/2020, HTN, DM    Examination-Activity Limitations Dressing;Hygiene/Grooming;Toileting;Reach Overhead;Carry;Lift    Stability/Clinical Decision Making Stable/Uncomplicated    Clinical Decision Making Low    Rehab Potential Good    PT Frequency 3x / week    PT Duration 8 weeks    PT Treatment/Interventions ADLs/Self Care Home Management;Cryotherapy;Electrical Stimulation;Moist Heat;Iontophoresis 4mg /ml Dexamethasone;Neuromuscular re-education;Manual techniques;Passive range of motion;Therapeutic exercise;Therapeutic activities;Functional mobility training;Patient/family education;Vasopneumatic Device    PT Next Visit Plan 12 weeks post op 04/18/2020, cont with AAROM to R shoulder,  PROM to right shoulder, modalities PRN for pain  relief.    PT Home Exercise Plan see patient education section    Consulted and Agree with Plan of Care Patient  Patient will benefit from skilled therapeutic intervention in order to improve the following deficits and impairments:  Decreased activity tolerance, Decreased range of motion, Decreased strength, Postural dysfunction, Pain, Impaired UE functional use, Decreased scar mobility  Visit Diagnosis: Acute pain of right shoulder  Stiffness of right shoulder, not elsewhere classified     Problem List Patient Active Problem List   Diagnosis Date Noted  . Glenoid fracture of shoulder 01/25/2020  . Diabetes mellitus ([place]) 12/09/2019  . COVID-19 12/01/2019  . H/O: hysterectomy 10/23/2018  . History of C-section 10/23/2018  . T2DM (type 2 diabetes mellitus) ([place]) 09/20/2017  . Morbid obesity with BMI of 40.0-44.9, adult ([place]) 02/13/2017  . Hypertension associated with type 2 diabetes mellitus ([place] [facility] [place]) 02/14/2015    Gabriela Eves, PT, DPT 04/25/2020, 9:11 AM  [facility] [address] [place], [place], [address] Phone: [phone]   Fax:  [phone]  Name: Brandy Hernandez MRN: [id number] Date of Birth: [birth year]

## 2020-04-27 ENCOUNTER — Encounter: Payer: Self-pay | Admitting: Physical Therapy

## 2020-04-27 ENCOUNTER — Ambulatory Visit: Payer: BC Managed Care – PPO | Admitting: Physical Therapy

## 2020-04-27 ENCOUNTER — Other Ambulatory Visit: Payer: Self-pay

## 2020-04-27 DIAGNOSIS — M25511 Pain in right shoulder: Secondary | ICD-10-CM

## 2020-04-27 DIAGNOSIS — M25611 Stiffness of right shoulder, not elsewhere classified: Secondary | ICD-10-CM

## 2020-04-27 NOTE — Therapy (Signed)
[facility] [place], [place], [address] Phone: [phone]   Fax:  [phone]  Physical Therapy Treatment  Patient Details  Name: Brandy Hernandez MRN: [id number] Date of Birth: [birth year] Referring Provider (PT): Ophelia Charter, MD   Encounter Date: 04/27/2020   PT End of Session - 04/27/20 0757    Visit Number 35    Number of Visits 42    Date for PT Re-Evaluation 05/20/20    Authorization Type FOTO (29th visit: 52% limitation); Progress note every 10th visit    PT Start Time 0732    PT Stop Time 0822    PT Time Calculation (min) 50 min    Activity Tolerance Patient tolerated treatment well    Behavior During Therapy [facility] for tasks assessed/performed           Past Medical History:  Diagnosis Date  . Basal cell carcinoma   . COVID-19   . Diabetes mellitus without complication ([place])   . Hypertension     Past Surgical History:  Procedure Laterality Date  . ABDOMINAL HYSTERECTOMY    . CESAREAN SECTION    . COLONOSCOPY N/A 01/19/2019   Procedure: COLONOSCOPY;  Surgeon: Danie Binder, MD;  [facility];  Service: Endoscopy;  Laterality: N/A;  9:30  . ENDOMETRIAL ABLATION    . POLYPECTOMY  01/19/2019   Procedure: POLYPECTOMY;  Surgeon: Danie Binder, MD;  [facility];  Service: Endoscopy;;  transverse colon   . REVERSE SHOULDER ARTHROPLASTY Right 01/25/2020   Procedure: TOTAL SHOULDER ARTHROPLASTY;  Surgeon: Hiram Gash, MD;  Location: [facility];  Service: Orthopedics;  Laterality: Right;  . SKIN CANCER EXCISION  2018    There were no vitals filed for this visit.   Subjective Assessment - 04/27/20 0751    Subjective COVID-19 screening performed upon arrival. Patient reports pain is just enough to know but not bad.    Pertinent History R TSA, 01/25/2020, HTN, DM    Limitations Lifting;House hold activities    Diagnostic tests x-ray    Patient Stated Goals "return to all activities"     Currently in Pain? Yes    Pain Score 1     Pain Location Shoulder    Pain Orientation Right    Pain Descriptors / Indicators Sore    Pain Type Surgical pain    Pain Onset More than a month ago    Pain Frequency Intermittent              [facility] PT Assessment - 04/27/20 0001      Assessment   Medical Diagnosis Right anatomic stemless total shoulder arthroplasty    Referring Provider (PT) Ophelia Charter, MD    Onset Date/Surgical Date 01/25/20    Hand Dominance Right    Next MD Visit "9 months"    Prior Therapy no      Precautions   Precautions Shoulder    Type of Shoulder Precautions Follow Dr. Rich Fuchs TSA protocol                         [facility] Adult PT Treatment/Exercise - 04/27/20 0001      Exercises   Exercises Shoulder      Shoulder Exercises: Standing   External Rotation AROM;Both;20 reps    Flexion AAROM;20 reps    Flexion Limitations with prayer hands    ABduction AROM;Both;20 reps    ABduction Limitations short lever arm  Other Standing Exercises --      Shoulder Exercises: Pulleys   Flexion 5 minutes      Shoulder Exercises: ROM/Strengthening   UBE (Upper Arm Bike) 120 RPM 8 mins, 4 fwd, 4 bwd AAROM    Ranger standing ranger flexion, CW & CCW circles x3 mins each    Other ROM/Strengthening Exercises R UE cone to bottom shelf 3x10      Shoulder Exercises: Stretch   Corner Stretch 3 reps;30 seconds      Modalities   Modalities Psychologist, educational Location R shoulder    Electrical Stimulation Action IFC    Electrical Stimulation Parameters 80-150 hz x10 mins    Electrical Stimulation Goals Pain      Vasopneumatic   Number Minutes Vasopneumatic  10 minutes    Vasopnuematic Location  Shoulder    Vasopneumatic Pressure Low    Vasopneumatic Temperature  34                       PT Long Term Goals - 04/18/20 0834      PT LONG TERM GOAL #1   Title  Patient will be independent with HEP    Time 8    Period Weeks    Status Achieved      PT LONG TERM GOAL #2   Title Patient will report ability to perform ADLs with right shoulder pain less than or equal to 3/10.    Time 8    Period Weeks    Status On-going      PT LONG TERM GOAL #3   Title Patient will demonstrate 55+ degrees of right shoulder ER AROM to improve donning and doffing apparel.    Time 8    Period Weeks    Status Achieved      PT LONG TERM GOAL #4   Title Patient will demonstrate 140+ degrees of right shoulder flexion AROM to improve overhead activities.    Time 8    Period Weeks    Status On-going      PT LONG TERM GOAL #5   Title Patient will demonstrate 4/5 or greater of right shoulder MMT in all planes to improve stability during functional tasks.    Time 8    Period Weeks    Status On-going                 Plan - 04/27/20 0815    Clinical Impression Statement Patient responded fairly well to the initiation of AROM TEs. Patient with UT compensation especially during fatigue. Verbal and tactile cuing provided for form and technique. Patient with increased soreness and heaviness towards end of session. No adverse affects upon removal.    Personal Factors and Comorbidities Comorbidity 2    Comorbidities right TSA 01/25/2020, HTN, DM    Examination-Activity Limitations Dressing;Hygiene/Grooming;Toileting;Reach Overhead;Carry;Lift    Stability/Clinical Decision Making Stable/Uncomplicated    Clinical Decision Making Low    Rehab Potential Good    PT Frequency 3x / week    PT Duration 8 weeks    PT Treatment/Interventions ADLs/Self Care Home Management;Cryotherapy;Electrical Stimulation;Moist Heat;Iontophoresis 4mg /ml Dexamethasone;Neuromuscular re-education;Manual techniques;Passive range of motion;Therapeutic exercise;Therapeutic activities;Functional mobility training;Patient/family education;Vasopneumatic Device    PT Next Visit Plan 12 weeks post op  04/18/2020, cont with AAROM to R shoulder,  PROM to right shoulder, modalities PRN for pain relief.    PT Home Exercise Plan see patient education section  Consulted and Agree with Plan of Care Patient           Patient will benefit from skilled therapeutic intervention in order to improve the following deficits and impairments:  Decreased activity tolerance, Decreased range of motion, Decreased strength, Postural dysfunction, Pain, Impaired UE functional use, Decreased scar mobility  Visit Diagnosis: Acute pain of right shoulder  Stiffness of right shoulder, not elsewhere classified     Problem List Patient Active Problem List   Diagnosis Date Noted  . Glenoid fracture of shoulder 01/25/2020  . Diabetes mellitus ([place]) 12/09/2019  . COVID-19 12/01/2019  . H/O: hysterectomy 10/23/2018  . History of C-section 10/23/2018  . T2DM (type 2 diabetes mellitus) ([place]) 09/20/2017  . Morbid obesity with BMI of 40.0-44.9, adult ([place]) 02/13/2017  . Hypertension associated with type 2 diabetes mellitus ([place]) 02/14/2015    Gabriela Eves, PT, DPT 04/27/2020, 8:29 AM  [facility] [address] [place], [place], [address] Phone: [phone]   Fax:  [phone]  Name: Brandy Hernandez MRN: [id number] Date of Birth: 14-Jan-1966

## 2020-04-29 ENCOUNTER — Ambulatory Visit: Payer: BC Managed Care – PPO | Admitting: Physical Therapy

## 2020-04-29 ENCOUNTER — Encounter: Payer: Self-pay | Admitting: Physical Therapy

## 2020-04-29 ENCOUNTER — Other Ambulatory Visit: Payer: Self-pay

## 2020-04-29 DIAGNOSIS — M25511 Pain in right shoulder: Secondary | ICD-10-CM | POA: Diagnosis not present

## 2020-04-29 DIAGNOSIS — M25611 Stiffness of right shoulder, not elsewhere classified: Secondary | ICD-10-CM

## 2020-04-29 NOTE — Therapy (Addendum)
[facility] [place], [place], [address] Phone: [phone]   Fax:  [phone]  Physical Therapy Treatment  Patient Details  Name: Brandy Hernandez MRN: [id number] Date of Birth: 1966/05/11 Referring Provider (PT): Ophelia Charter, MD   Encounter Date: 04/29/2020   PT End of Session - 04/29/20 0741    Visit Number 36    Number of Visits 42    Date for PT Re-Evaluation 05/20/20    Authorization Type FOTO (29th visit: 52% limitation); Progress note every 10th visit    PT Start Time 0730    Activity Tolerance Patient tolerated treatment well    Behavior During Therapy [facility] for tasks assessed/performed           Past Medical History:  Diagnosis Date  . Basal cell carcinoma   . COVID-19   . Diabetes mellitus without complication ([place])   . Hypertension     Past Surgical History:  Procedure Laterality Date  . ABDOMINAL HYSTERECTOMY    . CESAREAN SECTION    . COLONOSCOPY N/A 01/19/2019   Procedure: COLONOSCOPY;  Surgeon: Danie Binder, MD;  [facility];  Service: Endoscopy;  Laterality: N/A;  9:30  . ENDOMETRIAL ABLATION    . POLYPECTOMY  01/19/2019   Procedure: POLYPECTOMY;  Surgeon: Danie Binder, MD;  [facility];  Service: Endoscopy;;  transverse colon   . REVERSE SHOULDER ARTHROPLASTY Right 01/25/2020   Procedure: TOTAL SHOULDER ARTHROPLASTY;  Surgeon: Hiram Gash, MD;  [facility];  Service: Orthopedics;  Laterality: Right;  . SKIN CANCER EXCISION  2018    There were no vitals filed for this visit.   Subjective Assessment - 04/29/20 0740    Subjective COVID-19 screening performed upon arrival. Patient arrives feeling "not that bad"    Pertinent History R TSA, 01/25/2020, HTN, DM    Limitations Lifting;House hold activities    Diagnostic tests x-ray    Patient Stated Goals "return to all activities"    Currently in Pain? Yes    Pain Score 1     Pain Location Shoulder    Pain  Orientation Right    Pain Descriptors / Indicators Sore    Pain Type Surgical pain    Pain Onset More than a month ago    Pain Frequency Intermittent              [facility] PT Assessment - 04/29/20 0001      Assessment   Medical Diagnosis Right anatomic stemless total shoulder arthroplasty    Referring Provider (PT) Ophelia Charter, MD    Onset Date/Surgical Date 01/25/20    Hand Dominance Right    Next MD Visit "9 months"    Prior Therapy no      Precautions   Precautions Shoulder    Type of Shoulder Precautions Follow Dr. Rich Fuchs TSA protocol      AROM   Right Shoulder Flexion 130 Degrees   supine   Right Shoulder External Rotation 52 Degrees      PROM   Right Shoulder Flexion 135 Degrees    Right Shoulder ABduction 100 Degrees                         [facility] Adult PT Treatment/Exercise - 04/29/20 0001      Exercises   Exercises Shoulder      Shoulder Exercises: Supine   Flexion AAROM;Both;10 reps;20 reps      Shoulder Exercises:  Pulleys   Flexion 5 minutes      Shoulder Exercises: ROM/Strengthening   UBE (Upper Arm Bike) 120 RPM 8 mins, 4 fwd, 4 bwd AAROM    [place] into flexion x3 mins with AAROM      Modalities   Modalities Psychologist, educational Location R shoulder    Electrical Stimulation Action IFC    Electrical Stimulation Parameters 80-150 hz x10    Electrical Stimulation Goals Pain      Vasopneumatic   Number Minutes Vasopneumatic  10 minutes    Vasopnuematic Location  Shoulder    Vasopneumatic Pressure Low    Vasopneumatic Temperature  34      Manual Therapy   Manual Therapy Passive ROM    Passive ROM PROM of R shoulder into flexion ER, IR with holds at end range              no electrical stimulation performed this visit. Gabriela Eves, PT, DPT        PT Long Term Goals - 04/18/20 1937      PT LONG TERM GOAL #1   Title Patient will be  independent with HEP    Time 8    Period Weeks    Status Achieved      PT LONG TERM GOAL #2   Title Patient will report ability to perform ADLs with right shoulder pain less than or equal to 3/10.    Time 8    Period Weeks    Status On-going      PT LONG TERM GOAL #3   Title Patient will demonstrate 55+ degrees of right shoulder ER AROM to improve donning and doffing apparel.    Time 8    Period Weeks    Status Achieved      PT LONG TERM GOAL #4   Title Patient will demonstrate 140+ degrees of right shoulder flexion AROM to improve overhead activities.    Time 8    Period Weeks    Status On-going      PT LONG TERM GOAL #5   Title Patient will demonstrate 4/5 or greater of right shoulder MMT in all planes to improve stability during functional tasks.    Time 8    Period Weeks    Status On-going                 Plan - 04/29/20 0907    Clinical Impression Statement Patient was able to complete treatment but still having difficulties with initiating movement. Active assist provided with unaffected left UE. PROM to R shoulder performed with some slight stiffness toward end range. Patient instructed to breathe through as she was holding her breath. Patient educated on the emphasis on HEP for strengthening. Patient reported understanding. No e-stim performed as pain levels are low.    Personal Factors and Comorbidities Comorbidity 2    Comorbidities right TSA 01/25/2020, HTN, DM    Examination-Activity Limitations Dressing;Hygiene/Grooming;Toileting;Reach Overhead;Carry;Lift    Stability/Clinical Decision Making Stable/Uncomplicated    Clinical Decision Making Low    Rehab Potential Good    PT Frequency 3x / week    PT Duration 8 weeks    PT Treatment/Interventions ADLs/Self Care Home Management;Cryotherapy;Electrical Stimulation;Moist Heat;Iontophoresis 4mg /ml Dexamethasone;Neuromuscular re-education;Manual techniques;Passive range of motion;Therapeutic exercise;Therapeutic  activities;Functional mobility training;Patient/family education;Vasopneumatic Device    PT Next Visit Plan 12 weeks post op 04/18/2020, cont with AAROM to R shoulder,  PROM to right shoulder,  modalities PRN for pain relief.    PT Home Exercise Plan see patient education section    Consulted and Agree with Plan of Care Patient           Patient will benefit from skilled therapeutic intervention in order to improve the following deficits and impairments:  Decreased activity tolerance, Decreased range of motion, Decreased strength, Postural dysfunction, Pain, Impaired UE functional use, Decreased scar mobility  Visit Diagnosis: Acute pain of right shoulder  Stiffness of right shoulder, not elsewhere classified     Problem List Patient Active Problem List   Diagnosis Date Noted  . Glenoid fracture of shoulder 01/25/2020  . Diabetes mellitus ([place]) 12/09/2019  . COVID-19 12/01/2019  . H/O: hysterectomy 10/23/2018  . History of C-section 10/23/2018  . T2DM (type 2 diabetes mellitus) ([place]) 09/20/2017  . Morbid obesity with BMI of 40.0-44.9, adult ([place]) 02/13/2017  . Hypertension associated with type 2 diabetes mellitus ([place]) 02/14/2015    Gabriela Eves, PT, DPT 04/29/2020, 10:37 AM  [facility] [address] [place], [place], [address] Phone: [phone]   Fax:  [phone]  Name: Brandy Hernandez MRN: [id number] Date of Birth: 01-31-1966

## 2020-05-03 ENCOUNTER — Other Ambulatory Visit: Payer: Self-pay

## 2020-05-03 ENCOUNTER — Encounter: Payer: Self-pay | Admitting: Physical Therapy

## 2020-05-03 ENCOUNTER — Ambulatory Visit: Payer: BC Managed Care – PPO | Admitting: Physical Therapy

## 2020-05-03 DIAGNOSIS — M25511 Pain in right shoulder: Secondary | ICD-10-CM

## 2020-05-03 DIAGNOSIS — M25611 Stiffness of right shoulder, not elsewhere classified: Secondary | ICD-10-CM

## 2020-05-03 NOTE — Therapy (Signed)
[facility] [place], [place], [address] Phone: [phone]   Fax:  [phone]  Physical Therapy Treatment  Patient Details  Name: Brandy Hernandez MRN: [id number] Date of Birth: Jan 02, 1966 Referring Provider (PT): Ophelia Charter, MD   Encounter Date: 05/03/2020   PT End of Session - 05/03/20 0734    Visit Number 37    Number of Visits 42    Date for PT Re-Evaluation 05/20/20    Authorization Type FOTO (29th visit: 52% limitation); Progress note every 10th visit    PT Start Time 0730    PT Stop Time 0818    PT Time Calculation (min) 48 min    Activity Tolerance Patient tolerated treatment well    Behavior During Therapy [facility] for tasks assessed/performed           Past Medical History:  Diagnosis Date  . Basal cell carcinoma   . COVID-19   . Diabetes mellitus without complication ([place])   . Hypertension     Past Surgical History:  Procedure Laterality Date  . ABDOMINAL HYSTERECTOMY    . CESAREAN SECTION    . COLONOSCOPY N/A 01/19/2019   Procedure: COLONOSCOPY;  Surgeon: Danie Binder, MD;  [facility];  Service: Endoscopy;  Laterality: N/A;  9:30  . ENDOMETRIAL ABLATION    . POLYPECTOMY  01/19/2019   Procedure: POLYPECTOMY;  Surgeon: Danie Binder, MD;  [facility];  Service: Endoscopy;;  transverse colon   . REVERSE SHOULDER ARTHROPLASTY Right 01/25/2020   Procedure: TOTAL SHOULDER ARTHROPLASTY;  Surgeon: Hiram Gash, MD;  [facility];  Service: Orthopedics;  Laterality: Right;  . SKIN CANCER EXCISION  2018    There were no vitals filed for this visit.   Subjective Assessment - 05/03/20 0732    Subjective COVID-19 screening performed upon arrival. Patient reports doing more exercises in the pool.    Pertinent History R TSA, 01/25/2020, HTN, DM    Limitations Lifting;House hold activities    Diagnostic tests x-ray    Patient Stated Goals "return to all activities"    Currently in  Pain? Yes    Pain Score 1     Pain Location Shoulder    Pain Orientation Right    Pain Descriptors / Indicators Sore    Pain Type Surgical pain    Pain Onset More than a month ago    Pain Frequency Intermittent              [facility] PT Assessment - 05/03/20 0001      Assessment   Medical Diagnosis Right anatomic stemless total shoulder arthroplasty    Referring Provider (PT) Ophelia Charter, MD    Onset Date/Surgical Date 01/25/20    Hand Dominance Right    Next MD Visit "9 months"    Prior Therapy no      Precautions   Precautions Shoulder    Type of Shoulder Precautions Follow Dr. Rich Fuchs TSA protocol                         [facility] Adult PT Treatment/Exercise - 05/03/20 0001      Exercises   Exercises Shoulder      Shoulder Exercises: Supine   Flexion AAROM;Both;10 reps;20 reps      Shoulder Exercises: Sidelying   External Rotation AROM;Right;20 reps    Flexion AROM;Right;20 reps    ABduction AROM;Right;20 reps      Shoulder Exercises: Pulleys  Flexion 5 minutes      Shoulder Exercises: ROM/Strengthening   UBE (Upper Arm Bike) 120 RPM 8 mins, 4 fwd, 4 bwd AAROM      Modalities   Modalities Vasopneumatic      Vasopneumatic   Number Minutes Vasopneumatic  10 minutes    Vasopnuematic Location  Shoulder    Vasopneumatic Pressure Low    Vasopneumatic Temperature  34      Manual Therapy   Manual Therapy Passive ROM    Passive ROM PROM of R shoulder into flexion ER, IR with holds at end range                       PT Long Term Goals - 05/03/20 0801      PT LONG TERM GOAL #1   Title Patient will be independent with HEP    Period Weeks    Status Achieved      PT LONG TERM GOAL #2   Title Patient will report ability to perform ADLs with right shoulder pain less than or equal to 3/10.    Time 8    Period Weeks    Status On-going      PT LONG TERM GOAL #3   Title Patient will demonstrate 55+ degrees of right shoulder ER AROM to  improve donning and doffing apparel.    Time 8    Period Weeks    Status On-going      PT LONG TERM GOAL #4   Title Patient will demonstrate 140+ degrees of right shoulder flexion AROM to improve overhead activities.    Time 8    Period Weeks    Status On-going      PT LONG TERM GOAL #5   Title Patient will demonstrate 4/5 or greater of right shoulder MMT in all planes to improve stability during functional tasks.    Time 8    Period Weeks    Status On-going                 Plan - 05/03/20 4259    Clinical Impression Statement Patient responded well to therapy but with ongoing soreness with end range AROM. Patient able to achieve level 25 on wall ladder but still with some assist with unaffected left. Patient's goals ongoing at this time. No adverse affects upon removal of modalities.    Personal Factors and Comorbidities Comorbidity 2    Comorbidities right TSA 01/25/2020, HTN, DM    Examination-Activity Limitations Dressing;Hygiene/Grooming;Toileting;Reach Overhead;Carry;Lift    Stability/Clinical Decision Making Stable/Uncomplicated    Clinical Decision Making Low    Rehab Potential Good    PT Frequency 3x / week    PT Duration 8 weeks    PT Treatment/Interventions ADLs/Self Care Home Management;Cryotherapy;Electrical Stimulation;Moist Heat;Iontophoresis 4mg /ml Dexamethasone;Neuromuscular re-education;Manual techniques;Passive range of motion;Therapeutic exercise;Therapeutic activities;Functional mobility training;Patient/family education;Vasopneumatic Device    PT Next Visit Plan 12 weeks post op 04/18/2020, cont with AAROM to R shoulder,  PROM to right shoulder, modalities PRN for pain relief.    PT Home Exercise Plan see patient education section    Consulted and Agree with Plan of Care Patient           Patient will benefit from skilled therapeutic intervention in order to improve the following deficits and impairments:  Decreased activity tolerance, Decreased range  of motion, Decreased strength, Postural dysfunction, Pain, Impaired UE functional use, Decreased scar mobility  Visit Diagnosis: Acute pain of right shoulder  Stiffness of right  shoulder, not elsewhere classified     Problem List Patient Active Problem List   Diagnosis Date Noted  . Glenoid fracture of shoulder 01/25/2020  . Diabetes mellitus ([place]) 12/09/2019  . COVID-19 12/01/2019  . H/O: hysterectomy 10/23/2018  . History of C-section 10/23/2018  . T2DM (type 2 diabetes mellitus) ([place]) 09/20/2017  . Morbid obesity with BMI of 40.0-44.9, adult ([place]) 02/13/2017  . Hypertension associated with type 2 diabetes mellitus ([place]) 02/14/2015    Gabriela Eves, PT, DPT 05/03/2020, 9:14 AM  [facility] [address] [place], [place], [address] Phone: [phone]   Fax:  [phone]  Name: Brandy Hernandez MRN: [id number] Date of Birth: 02-May-1966

## 2020-05-05 ENCOUNTER — Encounter: Payer: Self-pay | Admitting: Physical Therapy

## 2020-05-05 ENCOUNTER — Other Ambulatory Visit: Payer: Self-pay

## 2020-05-05 ENCOUNTER — Ambulatory Visit: Payer: BC Managed Care – PPO | Attending: Orthopaedic Surgery | Admitting: Physical Therapy

## 2020-05-05 DIAGNOSIS — M25511 Pain in right shoulder: Secondary | ICD-10-CM | POA: Insufficient documentation

## 2020-05-05 DIAGNOSIS — M25611 Stiffness of right shoulder, not elsewhere classified: Secondary | ICD-10-CM | POA: Diagnosis present

## 2020-05-05 NOTE — Therapy (Signed)
[facility] [place], [place], [address] Phone: [phone]   Fax:  [phone]  Physical Therapy Treatment  Patient Details  Name: Brandy Hernandez MRN: [id number] Date of Birth: October 23, 1966 Referring Provider (PT): Ophelia Charter, MD   Encounter Date: 05/05/2020   PT End of Session - 05/05/20 0747    Visit Number 38    Number of Visits 42    Date for PT Re-Evaluation 05/20/20    Authorization Type FOTO (29th visit: 52% limitation); Progress note every 10th visit    PT Start Time [phone]    PT Stop Time 0818    PT Time Calculation (min) 45 min    Activity Tolerance Patient tolerated treatment well    Behavior During Therapy [facility] for tasks assessed/performed           Past Medical History:  Diagnosis Date  . Basal cell carcinoma   . COVID-19   . Diabetes mellitus without complication ([place])   . Hypertension     Past Surgical History:  Procedure Laterality Date  . ABDOMINAL HYSTERECTOMY    . CESAREAN SECTION    . COLONOSCOPY N/A 01/19/2019   Procedure: COLONOSCOPY;  Surgeon: Danie Binder, MD;  [facility];  Service: Endoscopy;  Laterality: N/A;  9:30  . ENDOMETRIAL ABLATION    . POLYPECTOMY  01/19/2019   Procedure: POLYPECTOMY;  Surgeon: Danie Binder, MD;  [facility];  Service: Endoscopy;;  transverse colon   . REVERSE SHOULDER ARTHROPLASTY Right 01/25/2020   Procedure: TOTAL SHOULDER ARTHROPLASTY;  Surgeon: Hiram Gash, MD;  Location: [facility];  Service: Orthopedics;  Laterality: Right;  . SKIN CANCER EXCISION  2018    There were no vitals filed for this visit.   Subjective Assessment - 05/05/20 0741    Subjective COVID-19 screening performed upon arrival. Patient reports "its alright"    Pertinent History R TSA, 01/25/2020, HTN, DM    Limitations Lifting;House hold activities    Diagnostic tests x-ray    Patient Stated Goals "return to all activities"    Currently in Pain? Yes    Pain  Score 1     Pain Location Shoulder    Pain Orientation Right    Pain Descriptors / Indicators Sore    Pain Type Surgical pain    Pain Onset More than a month ago    Pain Frequency Intermittent              [facility] PT Assessment - 05/05/20 0001      Assessment   Medical Diagnosis Right anatomic stemless total shoulder arthroplasty    Referring Provider (PT) Ophelia Charter, MD    Onset Date/Surgical Date 01/25/20    Hand Dominance Right    Next MD Visit "9 months"    Prior Therapy no      Precautions   Precautions Shoulder    Type of Shoulder Precautions Follow Dr. Rich Fuchs TSA protocol                         [facility] Adult PT Treatment/Exercise - 05/05/20 0001      Exercises   Exercises Shoulder      Shoulder Exercises: Standing   Flexion AROM;Right;20 reps    ABduction --    Extension Strengthening;20 reps    Theraband Level (Shoulder Extension) Level 1 (Yellow)    Row Strengthening;Right;20 reps    Theraband Level (Shoulder Row) Level 1 (Yellow)  Other Standing Exercises Biceps curls 1# x2 mins    Other Standing Exercises R shoulder scaption x10      Shoulder Exercises: Pulleys   Flexion 5 minutes      Shoulder Exercises: ROM/Strengthening   UBE (Upper Arm Bike) 120 RPM 8 mins, 4 fwd, 4 bwd AAROM    Ranger standing ranger flexion, CW & CCW circles x3 mins each      Modalities   Modalities Vasopneumatic      Vasopneumatic   Number Minutes Vasopneumatic  10 minutes    Vasopnuematic Location  Shoulder    Vasopneumatic Pressure Low    Vasopneumatic Temperature  34                       PT Long Term Goals - 05/03/20 0801      PT LONG TERM GOAL #1   Title Patient will be independent with HEP    Period Weeks    Status Achieved      PT LONG TERM GOAL #2   Title Patient will report ability to perform ADLs with right shoulder pain less than or equal to 3/10.    Time 8    Period Weeks    Status On-going      PT LONG TERM GOAL #3    Title Patient will demonstrate 55+ degrees of right shoulder ER AROM to improve donning and doffing apparel.    Time 8    Period Weeks    Status On-going      PT LONG TERM GOAL #4   Title Patient will demonstrate 140+ degrees of right shoulder flexion AROM to improve overhead activities.    Time 8    Period Weeks    Status On-going      PT LONG TERM GOAL #5   Title Patient will demonstrate 4/5 or greater of right shoulder MMT in all planes to improve stability during functional tasks.    Time 8    Period Weeks    Status On-going                 Plan - 05/05/20 0808    Clinical Impression Statement Patient was able to tolerate progression of treatment well with just reports of fatigue and soreness. Standing right shoulder AROM initiated with low reps. Patient noted with shoulder UT compensations and trunk lean especially when fatigued. Patient and PT discussed attempting AROM exercises earlier in the session next visit to see how well she can perform when she is not fatigued. No adverse affects upon removal of modalities.    Personal Factors and Comorbidities Comorbidity 2    Comorbidities right TSA 01/25/2020, HTN, DM    Examination-Activity Limitations Dressing;Hygiene/Grooming;Toileting;Reach Overhead;Carry;Lift    Stability/Clinical Decision Making Stable/Uncomplicated    Clinical Decision Making Low    Rehab Potential Good    PT Frequency 3x / week    PT Duration 8 weeks    PT Treatment/Interventions ADLs/Self Care Home Management;Cryotherapy;Electrical Stimulation;Moist Heat;Iontophoresis 4mg /ml Dexamethasone;Neuromuscular re-education;Manual techniques;Passive range of motion;Therapeutic exercise;Therapeutic activities;Functional mobility training;Patient/family education;Vasopneumatic Device    PT Next Visit Plan 15 weeks post op 05/09/2020, cont with AAROM to R shoulder,  PROM to right shoulder, modalities PRN for pain relief.    Consulted and Agree with Plan of Care  Patient           Patient will benefit from skilled therapeutic intervention in order to improve the following deficits and impairments:  Decreased activity tolerance, Decreased range of  motion, Decreased strength, Postural dysfunction, Pain, Impaired UE functional use, Decreased scar mobility  Visit Diagnosis: Acute pain of right shoulder  Stiffness of right shoulder, not elsewhere classified     Problem List Patient Active Problem List   Diagnosis Date Noted  . Glenoid fracture of shoulder 01/25/2020  . Diabetes mellitus ([place]) 12/09/2019  . COVID-19 12/01/2019  . H/O: hysterectomy 10/23/2018  . History of C-section 10/23/2018  . T2DM (type 2 diabetes mellitus) ([place]) 09/20/2017  . Morbid obesity with BMI of 40.0-44.9, adult ([place]) 02/13/2017  . Hypertension associated with type 2 diabetes mellitus ([place]) 02/14/2015    Gabriela Eves, PT, DPT 05/05/2020, 8:18 AM  [facility] [address] [place], [place], [address] Phone: [phone]   Fax:  [phone]  Name: Brandy Hernandez MRN: [id number] Date of Birth: 1965-11-09

## 2020-05-10 ENCOUNTER — Encounter: Payer: Self-pay | Admitting: Physical Therapy

## 2020-05-10 ENCOUNTER — Other Ambulatory Visit: Payer: Self-pay

## 2020-05-10 ENCOUNTER — Ambulatory Visit: Payer: BC Managed Care – PPO | Admitting: Physical Therapy

## 2020-05-10 DIAGNOSIS — M25511 Pain in right shoulder: Secondary | ICD-10-CM | POA: Diagnosis not present

## 2020-05-10 DIAGNOSIS — M25611 Stiffness of right shoulder, not elsewhere classified: Secondary | ICD-10-CM

## 2020-05-10 NOTE — Therapy (Signed)
[facility] [place], [place], [address] Phone: [phone]   Fax:  [phone]  Physical Therapy Treatment  Patient Details  Name: Brandy Hernandez MRN: [id number] Date of Birth: 1966/01/16 Referring Provider (PT): Ophelia Charter, MD   Encounter Date: 05/10/2020   PT End of Session - 05/10/20 0751    Visit Number 39    Number of Visits 42    Date for PT Re-Evaluation 05/20/20    Authorization Type FOTO (29th visit: 52% limitation); Progress note every 10th visit    PT Start Time 0734    PT Stop Time 0818    PT Time Calculation (min) 44 min    Activity Tolerance Patient tolerated treatment well    Behavior During Therapy [facility] for tasks assessed/performed           Past Medical History:  Diagnosis Date  . Basal cell carcinoma   . COVID-19   . Diabetes mellitus without complication ([place])   . Hypertension     Past Surgical History:  Procedure Laterality Date  . ABDOMINAL HYSTERECTOMY    . CESAREAN SECTION    . COLONOSCOPY N/A 01/19/2019   Procedure: COLONOSCOPY;  Surgeon: Danie Binder, MD;  [facility];  Service: Endoscopy;  Laterality: N/A;  9:30  . ENDOMETRIAL ABLATION    . POLYPECTOMY  01/19/2019   Procedure: POLYPECTOMY;  Surgeon: Danie Binder, MD;  [facility];  Service: Endoscopy;;  transverse colon   . REVERSE SHOULDER ARTHROPLASTY Right 01/25/2020   Procedure: TOTAL SHOULDER ARTHROPLASTY;  Surgeon: Hiram Gash, MD;  [facility];  Service: Orthopedics;  Laterality: Right;  . SKIN CANCER EXCISION  2018    There were no vitals filed for this visit.   Subjective Assessment - 05/10/20 0747    Subjective COVID-19 screening performed upon arrival. Patient reports working the shoulder out in the pool over the weekend.    Pertinent History R TSA, 01/25/2020, HTN, DM    Limitations Lifting;House hold activities    Diagnostic tests x-ray    Patient Stated Goals "return to all  activities"    Currently in Pain? Yes    Pain Score 3     Pain Location Shoulder    Pain Orientation Right    Pain Descriptors / Indicators Sore    Pain Type Surgical pain    Pain Onset More than a month ago    Pain Frequency Intermittent              [facility] PT Assessment - 05/10/20 0001      Assessment   Medical Diagnosis Right anatomic stemless total shoulder arthroplasty    Referring Provider (PT) Ophelia Charter, MD    Onset Date/Surgical Date 01/25/20    Hand Dominance Right    Next MD Visit "9 months"    Prior Therapy no      Precautions   Precautions Shoulder    Type of Shoulder Precautions Follow Dr. Rich Fuchs TSA protocol                         [facility] Adult PT Treatment/Exercise - 05/10/20 0001      Exercises   Exercises Shoulder      Shoulder Exercises: Sidelying   External Rotation AROM;Right;20 reps    Flexion AROM;Right;20 reps    ABduction AROM;Right;20 reps      Shoulder Exercises: Pulleys   Flexion 5 minutes  Shoulder Exercises: ROM/Strengthening   UBE (Upper Arm Bike) 120 RPM 8 mins, 4 fwd, 4 bwd AAROM    Ranger standing ranger flexion, CW & CCW circles x3 mins each      Modalities   Modalities Vasopneumatic      Vasopneumatic   Number Minutes Vasopneumatic  10 minutes    Vasopnuematic Location  Shoulder    Vasopneumatic Pressure Low    Vasopneumatic Temperature  48                       PT Long Term Goals - 05/03/20 0801      PT LONG TERM GOAL #1   Title Patient will be independent with HEP    Period Weeks    Status Achieved      PT LONG TERM GOAL #2   Title Patient will report ability to perform ADLs with right shoulder pain less than or equal to 3/10.    Time 8    Period Weeks    Status On-going      PT LONG TERM GOAL #3   Title Patient will demonstrate 55+ degrees of right shoulder ER AROM to improve donning and doffing apparel.    Time 8    Period Weeks    Status On-going      PT LONG TERM GOAL  #4   Title Patient will demonstrate 140+ degrees of right shoulder flexion AROM to improve overhead activities.    Time 8    Period Weeks    Status On-going      PT LONG TERM GOAL #5   Title Patient will demonstrate 4/5 or greater of right shoulder MMT in all planes to improve stability during functional tasks.    Time 8    Period Weeks    Status On-going                 Plan - 05/10/20 0802    Clinical Impression Statement Patient was able to tolerate treatment well. Rest breaks provided to prevent UT compensation of R shoulder and for fatigue. Patient noted with improve muscular endurance as noted by the ability to perform Ranger for 3 minutes without rest in each direction. No adverse affects upon removal of vasopneumatic device.    Personal Factors and Comorbidities Comorbidity 2    Comorbidities right TSA 01/25/2020, HTN, DM    Examination-Activity Limitations Dressing;Hygiene/Grooming;Toileting;Reach Overhead;Carry;Lift    Stability/Clinical Decision Making Stable/Uncomplicated    Clinical Decision Making Low    Rehab Potential Good    PT Frequency 3x / week    PT Duration 8 weeks    PT Treatment/Interventions ADLs/Self Care Home Management;Cryotherapy;Electrical Stimulation;Moist Heat;Iontophoresis 4mg /ml Dexamethasone;Neuromuscular re-education;Manual techniques;Passive range of motion;Therapeutic exercise;Therapeutic activities;Functional mobility training;Patient/family education;Vasopneumatic Device    PT Next Visit Plan 15 weeks post op 05/09/2020, cont with AAROM to R shoulder,  PROM to right shoulder, modalities PRN for pain relief.    PT Home Exercise Plan see patient education section    Consulted and Agree with Plan of Care Patient           Patient will benefit from skilled therapeutic intervention in order to improve the following deficits and impairments:  Decreased activity tolerance, Decreased range of motion, Decreased strength, Postural dysfunction, Pain,  Impaired UE functional use, Decreased scar mobility  Visit Diagnosis: Acute pain of right shoulder  Stiffness of right shoulder, not elsewhere classified     Problem List Patient Active Problem List   Diagnosis Date  Noted  . Glenoid fracture of shoulder 01/25/2020  . Diabetes mellitus ([place]) 12/09/2019  . COVID-19 12/01/2019  . H/O: hysterectomy 10/23/2018  . History of C-section 10/23/2018  . T2DM (type 2 diabetes mellitus) ([place]) 09/20/2017  . Morbid obesity with BMI of 40.0-44.9, adult ([place]) 02/13/2017  . Hypertension associated with type 2 diabetes mellitus ([place]) 02/14/2015    Gabriela Eves, PT, DPT 05/10/2020, 8:22 AM  [facility] [address] [place], [place], [address] Phone: [phone]   Fax:  [phone]  Name: Brandy Hernandez MRN: [id number] Date of Birth: May 29, 1966

## 2020-05-12 ENCOUNTER — Encounter: Payer: BC Managed Care – PPO | Admitting: Physical Therapy

## 2020-05-13 ENCOUNTER — Encounter: Payer: Self-pay | Admitting: Physical Therapy

## 2020-05-13 ENCOUNTER — Ambulatory Visit: Payer: BC Managed Care – PPO | Admitting: Physical Therapy

## 2020-05-13 ENCOUNTER — Other Ambulatory Visit: Payer: Self-pay

## 2020-05-13 DIAGNOSIS — M25511 Pain in right shoulder: Secondary | ICD-10-CM | POA: Diagnosis not present

## 2020-05-13 DIAGNOSIS — M25611 Stiffness of right shoulder, not elsewhere classified: Secondary | ICD-10-CM

## 2020-05-13 NOTE — Therapy (Signed)
[facility] [place], [place], [address] Phone: [phone]   Fax:  [phone]  Physical Therapy Treatment Progress Note Reporting Period 04/15/2020 to 05/13/2020  See note below for Objective Data and Assessment of Progress/Goals. Patient goals ongoing; unable to perform full AROM at this time due to weakness. Gabriela Eves, PT, DPT     Patient Details  Name: LORANN TANI MRN: [id number] Date of Birth: 1965-12-04 Referring Provider (PT): Ophelia Charter, MD   Encounter Date: 05/13/2020   PT End of Session - 05/13/20 0813    Visit Number 40    Number of Visits 42    Date for PT Re-Evaluation 05/20/20    Authorization Type FOTO (29th visit: 52% limitation); Progress note every 10th visit    PT Start Time 0732    PT Stop Time 0817    PT Time Calculation (min) 45 min    Activity Tolerance Patient tolerated treatment well    Behavior During Therapy [facility] for tasks assessed/performed           Past Medical History:  Diagnosis Date  . Basal cell carcinoma   . COVID-19   . Diabetes mellitus without complication ([place])   . Hypertension     Past Surgical History:  Procedure Laterality Date  . ABDOMINAL HYSTERECTOMY    . CESAREAN SECTION    . COLONOSCOPY N/A 01/19/2019   Procedure: COLONOSCOPY;  Surgeon: Danie Binder, MD;  [facility];  Service: Endoscopy;  Laterality: N/A;  9:30  . ENDOMETRIAL ABLATION    . POLYPECTOMY  01/19/2019   Procedure: POLYPECTOMY;  Surgeon: Danie Binder, MD;  [facility];  Service: Endoscopy;;  transverse colon   . REVERSE SHOULDER ARTHROPLASTY Right 01/25/2020   Procedure: TOTAL SHOULDER ARTHROPLASTY;  Surgeon: Hiram Gash, MD;  [facility];  Service: Orthopedics;  Laterality: Right;  . SKIN CANCER EXCISION  2018    There were no vitals filed for this visit.   Subjective Assessment - 05/13/20 0849    Subjective COVID-19 screening performed upon arrival.  Patient reports more soreness today might be due to the weather.    Pertinent History R TSA, 01/25/2020, HTN, DM    Limitations Lifting;House hold activities    Diagnostic tests x-ray    Patient Stated Goals "return to all activities"    Currently in Pain? Yes    Pain Score 2     Pain Location Shoulder    Pain Orientation Right    Pain Descriptors / Indicators Sore    Pain Type Surgical pain    Pain Onset More than a month ago    Pain Frequency Intermittent              [facility] PT Assessment - 05/13/20 0001      Assessment   Medical Diagnosis Right anatomic stemless total shoulder arthroplasty    Referring Provider (PT) Ophelia Charter, MD    Onset Date/Surgical Date 01/25/20    Hand Dominance Right    Next MD Visit "9 months"    Prior Therapy no      Precautions   Precautions Shoulder    Type of Shoulder Precautions Follow Dr. Rich Fuchs TSA protocol                         [facility] Adult PT Treatment/Exercise - 05/13/20 0001      Exercises   Exercises Shoulder      Shoulder  Exercises: Standing   Flexion Right;20 reps;AROM    Flexion Limitations jar to bottom shelf      Shoulder Exercises: Pulleys   Flexion 5 minutes      Shoulder Exercises: ROM/Strengthening   UBE (Upper Arm Bike) 120 RPM 8 mins, 4 fwd, 4 bwd AAROM    Ranger standing ranger flexion, CW & CCW circles x3 mins each      Modalities   Modalities Vasopneumatic      Vasopneumatic   Number Minutes Vasopneumatic  10 minutes    Vasopnuematic Location  Shoulder    Vasopneumatic Pressure Low    Vasopneumatic Temperature  48      Manual Therapy   Manual Therapy Passive ROM    Passive ROM PROM of R shoulder into flexion ER, IR with holds at end range                       PT Long Term Goals - 05/13/20 0850      PT LONG TERM GOAL #1   Title Patient will be independent with HEP    Time 8    Period Weeks    Status Achieved      PT LONG TERM GOAL #2   Title Patient will report  ability to perform ADLs with right shoulder pain less than or equal to 3/10.    Time 8    Period Weeks    Status On-going      PT LONG TERM GOAL #3   Title Patient will demonstrate 55+ degrees of right shoulder ER AROM to improve donning and doffing apparel.    Time 8    Period Weeks    Status On-going      PT LONG TERM GOAL #4   Title Patient will demonstrate 140+ degrees of right shoulder flexion AROM to improve overhead activities.    Time 8    Period Weeks    Status On-going      PT LONG TERM GOAL #5   Title Patient will demonstrate 4/5 or greater of right shoulder MMT in all planes to improve stability during functional tasks.    Time 8    Period Weeks    Status On-going                 Plan - 05/13/20 0751    Clinical Impression Statement Patient responded well to therapy session. Patient was able to peform standing ranger with improved range and endurance before performing UBE. Patient still with ongoing weakness with right shoulder AROM.    Personal Factors and Comorbidities Comorbidity 2    Comorbidities right TSA 01/25/2020, HTN, DM    Examination-Activity Limitations Dressing;Hygiene/Grooming;Toileting;Reach Overhead;Carry;Lift    Stability/Clinical Decision Making Stable/Uncomplicated    Clinical Decision Making Low    Rehab Potential Good    PT Frequency 3x / week    PT Duration 8 weeks    PT Treatment/Interventions ADLs/Self Care Home Management;Cryotherapy;Electrical Stimulation;Moist Heat;Iontophoresis 4mg /ml Dexamethasone;Neuromuscular re-education;Manual techniques;Passive range of motion;Therapeutic exercise;Therapeutic activities;Functional mobility training;Patient/family education;Vasopneumatic Device    PT Next Visit Plan 15 weeks post op 05/09/2020, cont with AAROM to R shoulder,  PROM to right shoulder, modalities PRN for pain relief.    PT Home Exercise Plan see patient education section    Consulted and Agree with Plan of Care Patient            Patient will benefit from skilled therapeutic intervention in order to improve the following deficits and impairments:  Decreased activity tolerance, Decreased range of motion, Decreased strength, Postural dysfunction, Pain, Impaired UE functional use, Decreased scar mobility  Visit Diagnosis: Acute pain of right shoulder  Stiffness of right shoulder, not elsewhere classified     Problem List Patient Active Problem List   Diagnosis Date Noted  . Glenoid fracture of shoulder 01/25/2020  . Diabetes mellitus ([place]) 12/09/2019  . COVID-19 12/01/2019  . H/O: hysterectomy 10/23/2018  . History of C-section 10/23/2018  . T2DM (type 2 diabetes mellitus) ([place]) 09/20/2017  . Morbid obesity with BMI of 40.0-44.9, adult ([place]) 02/13/2017  . Hypertension associated with type 2 diabetes mellitus ([place]) 02/14/2015    Gabriela Eves, PT, DPT 05/13/2020, 9:50 AM  [facility] [address] [place], [place], [address] Phone: [phone]   Fax:  [phone]  Name: CHARLANE WESTRY MRN: [id number] Date of Birth: 04/09/1966

## 2020-05-17 ENCOUNTER — Other Ambulatory Visit: Payer: Self-pay

## 2020-05-17 ENCOUNTER — Encounter: Payer: Self-pay | Admitting: Physical Therapy

## 2020-05-17 ENCOUNTER — Ambulatory Visit: Payer: BC Managed Care – PPO | Admitting: Physical Therapy

## 2020-05-17 DIAGNOSIS — M25511 Pain in right shoulder: Secondary | ICD-10-CM | POA: Diagnosis not present

## 2020-05-17 DIAGNOSIS — M25611 Stiffness of right shoulder, not elsewhere classified: Secondary | ICD-10-CM

## 2020-05-17 NOTE — Therapy (Signed)
[facility] [place], [place], [address] Phone: [phone]   Fax:  [phone]  Physical Therapy Treatment  Patient Details  Name: Brandy Hernandez MRN: [id number] Date of Birth: [birth year] Referring Provider (PT): Ophelia Charter, MD   Encounter Date: 05/17/2020   PT End of Session - 05/17/20 0743    Visit Number 41    Number of Visits 42    Date for PT Re-Evaluation 05/20/20    Authorization Type FOTO (29th visit: 52% limitation); Progress note every 10th visit    PT Start Time 0731    PT Stop Time 0819    PT Time Calculation (min) 48 min    Activity Tolerance Patient tolerated treatment well    Behavior During Therapy [facility] for tasks assessed/performed           Past Medical History:  Diagnosis Date  . Basal cell carcinoma   . COVID-19   . Diabetes mellitus without complication ([place])   . Hypertension     Past Surgical History:  Procedure Laterality Date  . ABDOMINAL HYSTERECTOMY    . CESAREAN SECTION    . COLONOSCOPY N/A 01/19/2019   Procedure: COLONOSCOPY;  Surgeon: Danie Binder, MD;  [facility];  Service: Endoscopy;  Laterality: N/A;  9:30  . ENDOMETRIAL ABLATION    . POLYPECTOMY  01/19/2019   Procedure: POLYPECTOMY;  Surgeon: Danie Binder, MD;  [facility];  Service: Endoscopy;;  transverse colon   . REVERSE SHOULDER ARTHROPLASTY Right 01/25/2020   Procedure: TOTAL SHOULDER ARTHROPLASTY;  Surgeon: Hiram Gash, MD;  Location: [facility];  Service: Orthopedics;  Laterality: Right;  . SKIN CANCER EXCISION  2018    There were no vitals filed for this visit.   Subjective Assessment - 05/17/20 0736    Subjective COVID-19 screening performed upon arrival. Patient reports more sore as she is doing more today.    Pertinent History R TSA, 01/25/2020, HTN, DM    Limitations Lifting;House hold activities    Diagnostic tests x-ray    Patient Stated Goals "return to all activities"     Currently in Pain? Yes    Pain Score 2     Pain Location Shoulder    Pain Orientation Right    Pain Descriptors / Indicators Sore    Pain Type Surgical pain    Pain Onset More than a month ago    Pain Frequency Intermittent              [facility] PT Assessment - 05/17/20 0001      Assessment   Medical Diagnosis Right anatomic stemless total shoulder arthroplasty    Referring Provider (PT) Ophelia Charter, MD    Onset Date/Surgical Date 01/25/20    Hand Dominance Right    Next MD Visit "9 months"    Prior Therapy no      Precautions   Precautions Shoulder    Type of Shoulder Precautions Follow Dr. Rich Fuchs TSA protocol      ROM / Strength   AROM / PROM / Strength Strength;AROM;PROM      AROM   Right Shoulder Flexion 88 Degrees    Right Shoulder ABduction 65 Degrees    Right Shoulder Internal Rotation 59 Degrees   to R glute   Right Shoulder External Rotation 42 Degrees   to occipital but with cervical flexion     PROM   Right Shoulder Flexion 137 Degrees    Right Shoulder ABduction 110 Degrees  Right Shoulder Internal Rotation 56 Degrees    Right Shoulder External Rotation 53 Degrees      Strength   Strength Assessment Site Shoulder    Right/Left Shoulder Right    Right Shoulder Flexion 2+/5    Right Shoulder ABduction 2+/5    Right Shoulder Internal Rotation 3-/5    Right Shoulder External Rotation 3-/5                         [facility] Adult PT Treatment/Exercise - 05/17/20 0001      Exercises   Exercises Shoulder      Shoulder Exercises: Pulleys   Flexion 5 minutes      Shoulder Exercises: ROM/Strengthening   UBE (Upper Arm Bike) 120 RPM 8 mins, 4 fwd, 4 bwd AAROM    Ranger standing ranger flexion, CW & CCW circles x3 mins each      Modalities   Modalities Vasopneumatic      Vasopneumatic   Number Minutes Vasopneumatic  10 minutes    Vasopnuematic Location  Shoulder    Vasopneumatic Pressure Low    Vasopneumatic Temperature  34       Manual Therapy   Manual Therapy Passive ROM    Passive ROM PROM of R shoulder into flexion ER, IR with holds at end range                       PT Long Term Goals - 05/13/20 0850      PT LONG TERM GOAL #1   Title Patient will be independent with HEP    Time 8    Period Weeks    Status Achieved      PT LONG TERM GOAL #2   Title Patient will report ability to perform ADLs with right shoulder pain less than or equal to 3/10.    Time 8    Period Weeks    Status On-going      PT LONG TERM GOAL #3   Title Patient will demonstrate 55+ degrees of right shoulder ER AROM to improve donning and doffing apparel.    Time 8    Period Weeks    Status On-going      PT LONG TERM GOAL #4   Title Patient will demonstrate 140+ degrees of right shoulder flexion AROM to improve overhead activities.    Time 8    Period Weeks    Status On-going      PT LONG TERM GOAL #5   Title Patient will demonstrate 4/5 or greater of right shoulder MMT in all planes to improve stability during functional tasks.    Time 8    Period Weeks    Status On-going                 Plan - 05/17/20 3559    Clinical Impression Statement Patient was able to complete treatment but with ongoing fatigue and soreness in R shoulder. Patient still with significant UE compensatory motions with AROM and with minimal change in measurements. Patient and PT discussed DC next visit with emphasis on HEP. Patient reported understanding and agreement. No adverse effects upon removal of modalities.    Personal Factors and Comorbidities Comorbidity 2    Comorbidities right TSA 01/25/2020, HTN, DM    Examination-Activity Limitations Dressing;Hygiene/Grooming;Toileting;Reach Overhead;Carry;Lift    Stability/Clinical Decision Making Stable/Uncomplicated    Clinical Decision Making Low    Rehab Potential Good    PT Frequency  3x / week    PT Duration 8 weeks    PT Treatment/Interventions ADLs/Self Care Home  Management;Cryotherapy;Electrical Stimulation;Moist Heat;Iontophoresis 4mg /ml Dexamethasone;Neuromuscular re-education;Manual techniques;Passive range of motion;Therapeutic exercise;Therapeutic activities;Functional mobility training;Patient/family education;Vasopneumatic Device    PT Next Visit Plan Review HEP, Provide bands. DC    PT Home Exercise Plan see patient education section    Consulted and Agree with Plan of Care Patient           Patient will benefit from skilled therapeutic intervention in order to improve the following deficits and impairments:  Decreased activity tolerance, Decreased range of motion, Decreased strength, Postural dysfunction, Pain, Impaired UE functional use, Decreased scar mobility  Visit Diagnosis: Acute pain of right shoulder  Stiffness of right shoulder, not elsewhere classified     Problem List Patient Active Problem List   Diagnosis Date Noted  . Glenoid fracture of shoulder 01/25/2020  . Diabetes mellitus ([place]) 12/09/2019  . COVID-19 12/01/2019  . H/O: hysterectomy 10/23/2018  . History of C-section 10/23/2018  . T2DM (type 2 diabetes mellitus) ([place]) 09/20/2017  . Morbid obesity with BMI of 40.0-44.9, adult ([place]) 02/13/2017  . Hypertension associated with type 2 diabetes mellitus ([place]) 02/14/2015    Gabriela Eves, PT, DPT 05/17/2020, 9:18 AM  [facility] [address] [place], [place], [address] Phone: [phone]   Fax:  [phone]  Name: Brandy Hernandez MRN: [id number] Date of Birth: 11-04-1966

## 2020-05-19 ENCOUNTER — Ambulatory Visit: Payer: BC Managed Care – PPO | Admitting: Physical Therapy

## 2020-05-19 ENCOUNTER — Other Ambulatory Visit: Payer: Self-pay

## 2020-05-19 DIAGNOSIS — M25611 Stiffness of right shoulder, not elsewhere classified: Secondary | ICD-10-CM

## 2020-05-19 DIAGNOSIS — M25511 Pain in right shoulder: Secondary | ICD-10-CM

## 2020-05-19 NOTE — Therapy (Signed)
[facility] [place], [place], [address] Phone: [phone]   Fax:  [phone]  Physical Therapy Treatment PHYSICAL THERAPY DISCHARGE SUMMARY  Visits from Start of Care: 67  Current functional level related to goals / functional outcomes: See below   Remaining deficits: See goals   Education / Equipment: HEP Plan: Patient agrees to discharge.  Patient goals were not met. Patient is being discharged due to lack of progress.  ?????    Gabriela Eves, PT, DPT  Patient Details  Name: Brandy Hernandez MRN: [id number] Date of Birth: 1966-01-19 Referring Provider (PT): Ophelia Charter, MD   Encounter Date: 05/19/2020   PT End of Session - 05/19/20 0826    Visit Number 42    Number of Visits 42    Date for PT Re-Evaluation 05/20/20    Authorization Type FOTO (29th visit: 52% limitation); Progress note every 10th visit    PT Start Time 0730    PT Stop Time 0820    PT Time Calculation (min) 50 min    Activity Tolerance Patient tolerated treatment well    Behavior During Therapy [facility] for tasks assessed/performed           Past Medical History:  Diagnosis Date  . Basal cell carcinoma   . COVID-19   . Diabetes mellitus without complication ([place])   . Hypertension     Past Surgical History:  Procedure Laterality Date  . ABDOMINAL HYSTERECTOMY    . CESAREAN SECTION    . COLONOSCOPY N/A 01/19/2019   Procedure: COLONOSCOPY;  Surgeon: Danie Binder, MD;  [facility];  Service: Endoscopy;  Laterality: N/A;  9:30  . ENDOMETRIAL ABLATION    . POLYPECTOMY  01/19/2019   Procedure: POLYPECTOMY;  Surgeon: Danie Binder, MD;  [facility];  Service: Endoscopy;;  transverse colon   . REVERSE SHOULDER ARTHROPLASTY Right 01/25/2020   Procedure: TOTAL SHOULDER ARTHROPLASTY;  Surgeon: Hiram Gash, MD;  [facility];  Service: Orthopedics;  Laterality: Right;  . SKIN CANCER EXCISION  2018    There  were no vitals filed for this visit.   Subjective Assessment - 05/19/20 0854    Subjective COVID-19 screening performed upon arrival. Patient reports doing alright.    Pertinent History R TSA, 01/25/2020, HTN, DM    Limitations Lifting;House hold activities    Diagnostic tests x-ray    Patient Stated Goals "return to all activities"    Currently in Pain? Yes   did not provide number on pain scale   Pain Location Shoulder    Pain Orientation Right    Pain Descriptors / Indicators Sore    Pain Type Surgical pain    Pain Onset More than a month ago    Pain Frequency Intermittent              [facility] PT Assessment - 05/19/20 0001      Assessment   Medical Diagnosis Right anatomic stemless total shoulder arthroplasty    Referring Provider (PT) Ophelia Charter, MD    Onset Date/Surgical Date 01/25/20    Hand Dominance Right    Next MD Visit "9 months"    Prior Therapy no      Precautions   Precautions Shoulder    Type of Shoulder Precautions Follow Dr. Rich Fuchs TSA protocol                         [facility] Adult PT Treatment/Exercise -  05/19/20 0001      Exercises   Exercises Shoulder      Shoulder Exercises: Standing   External Rotation AROM;Both;20 reps    Theraband Level (Shoulder External Rotation) Level 1 (Yellow)    External Rotation Limitations step outs    Internal Rotation Strengthening;Left;20 reps;Theraband    Theraband Level (Shoulder Internal Rotation) Level 1 (Yellow)    Flexion Right;20 reps;AROM    Flexion Limitations bilateral    ABduction AROM;Both;20 reps    Extension Strengthening;20 reps    Theraband Level (Shoulder Extension) Level 1 (Yellow)    Row Strengthening;Right;20 reps    Theraband Level (Shoulder Row) Level 1 (Yellow)      Shoulder Exercises: Pulleys   Flexion 5 minutes      Shoulder Exercises: ROM/Strengthening   UBE (Upper Arm Bike) 120 RPM 8 mins, 4 fwd, 4 bwd AAROM    Ranger standing ranger flexion, CW & CCW circles x3 mins  each      Modalities   Modalities Vasopneumatic      Vasopneumatic   Number Minutes Vasopneumatic  10 minutes    Vasopnuematic Location  Shoulder    Vasopneumatic Pressure Low    Vasopneumatic Temperature  34                       PT Long Term Goals - 05/19/20 0826      PT LONG TERM GOAL #1   Title Patient will be independent with HEP    Time 8    Period Weeks    Status Achieved      PT LONG TERM GOAL #2   Title Patient will report ability to perform ADLs with right shoulder pain less than or equal to 3/10.    Time 8    Period Weeks    Status Achieved      PT LONG TERM GOAL #3   Title Patient will demonstrate 55+ degrees of right shoulder ER AROM to improve donning and doffing apparel.    Time 8    Period Weeks    Status Not Met      PT LONG TERM GOAL #4   Title Patient will demonstrate 140+ degrees of right shoulder flexion AROM to improve overhead activities.    Time 8    Period Weeks    Status Not Met      PT LONG TERM GOAL #5   Title Patient will demonstrate 4/5 or greater of right shoulder MMT in all planes to improve stability during functional tasks.    Time 8    Period Weeks    Status Not Met                 Plan - 05/19/20 0827    Clinical Impression Statement Patient responded well to therapy but with fatigue. Patient guided through TEs to which she was able to complete but with UT compensations. Goals partially met. Patient and PT reviewed HEP to which she reported understanding. PT emphasized the importance of continuing HEP and remembering to return to ROM HEP to maintain gains. Patient reported understanding. Normal response to modalities upon removal of modalities.    Personal Factors and Comorbidities Comorbidity 2    Comorbidities right TSA 01/25/2020, HTN, DM    Examination-Activity Limitations Dressing;Hygiene/Grooming;Toileting;Reach Overhead;Carry;Lift    Stability/Clinical Decision Making Stable/Uncomplicated    Clinical  Decision Making Low    Rehab Potential Good    PT Frequency 3x / week    PT  Duration 8 weeks    PT Treatment/Interventions ADLs/Self Care Home Management;Cryotherapy;Electrical Stimulation;Moist Heat;Iontophoresis 84m/ml Dexamethasone;Neuromuscular re-education;Manual techniques;Passive range of motion;Therapeutic exercise;Therapeutic activities;Functional mobility training;Patient/family education;Vasopneumatic Device    PT Next Visit Plan DC    PT Home Exercise Plan see patient education section    Consulted and Agree with Plan of Care Patient           Patient will benefit from skilled therapeutic intervention in order to improve the following deficits and impairments:  Decreased activity tolerance, Decreased range of motion, Decreased strength, Postural dysfunction, Pain, Impaired UE functional use, Decreased scar mobility  Visit Diagnosis: Acute pain of right shoulder  Stiffness of right shoulder, not elsewhere classified     Problem List Patient Active Problem List   Diagnosis Date Noted  . Glenoid fracture of shoulder 01/25/2020  . Diabetes mellitus ([place] 12/09/2019  . COVID-19 12/01/2019  . H/O: hysterectomy 10/23/2018  . History of C-section 10/23/2018  . T2DM (type 2 diabetes mellitus) ([place] 09/20/2017  . Morbid obesity with BMI of 40.0-44.9, adult ([place] 02/13/2017  . Hypertension associated with type 2 diabetes mellitus ([place] 02/14/2015    KGabriela Eves PT, DPT 05/19/2020, 9:04 AM  [facility] [address] [address]Phone: [phone]  Fax:  [phone] Name: Brandy HILLMRN: [id number] of Birth: 09/27/1966/05/30

## 2020-05-24 ENCOUNTER — Encounter: Payer: BC Managed Care – PPO | Admitting: Physical Therapy

## 2020-05-26 ENCOUNTER — Encounter: Payer: BC Managed Care – PPO | Admitting: Physical Therapy

## 2020-05-27 ENCOUNTER — Other Ambulatory Visit: Payer: Self-pay | Admitting: *Deleted

## 2020-05-27 DIAGNOSIS — E1159 Type 2 diabetes mellitus with other circulatory complications: Secondary | ICD-10-CM

## 2020-05-27 MED ORDER — LISINOPRIL 5 MG PO TABS: 5.0000 mg | ORAL_TABLET | Freq: Every day | ORAL | 0 refills | Status: DC

## 2020-05-31 ENCOUNTER — Encounter: Payer: BC Managed Care – PPO | Admitting: Physical Therapy

## 2020-06-02 ENCOUNTER — Encounter: Payer: BC Managed Care – PPO | Admitting: Physical Therapy

## 2020-06-09 ENCOUNTER — Ambulatory Visit: Payer: BC Managed Care – PPO | Admitting: Family Medicine

## 2020-06-09 ENCOUNTER — Other Ambulatory Visit: Payer: Self-pay

## 2020-06-09 ENCOUNTER — Ambulatory Visit (INDEPENDENT_AMBULATORY_CARE_PROVIDER_SITE_OTHER): Payer: BC Managed Care – PPO | Admitting: Family Medicine

## 2020-06-09 ENCOUNTER — Encounter: Payer: Self-pay | Admitting: Family Medicine

## 2020-06-09 VITALS — BP 101/77 | HR 90 | Temp 97.4°F | Ht 69.0 in | Wt 273.0 lb

## 2020-06-09 DIAGNOSIS — Z1159 Encounter for screening for other viral diseases: Secondary | ICD-10-CM | POA: Diagnosis not present

## 2020-06-09 DIAGNOSIS — E1159 Type 2 diabetes mellitus with other circulatory complications: Secondary | ICD-10-CM | POA: Diagnosis not present

## 2020-06-09 DIAGNOSIS — E1169 Type 2 diabetes mellitus with other specified complication: Secondary | ICD-10-CM | POA: Diagnosis not present

## 2020-06-09 DIAGNOSIS — E119 Type 2 diabetes mellitus without complications: Secondary | ICD-10-CM

## 2020-06-09 DIAGNOSIS — E785 Hyperlipidemia, unspecified: Secondary | ICD-10-CM

## 2020-06-09 DIAGNOSIS — Z6841 Body Mass Index (BMI) 40.0 and over, adult: Secondary | ICD-10-CM

## 2020-06-09 DIAGNOSIS — I152 Hypertension secondary to endocrine disorders: Secondary | ICD-10-CM

## 2020-06-09 DIAGNOSIS — I10 Essential (primary) hypertension: Secondary | ICD-10-CM

## 2020-06-09 LAB — BAYER DCA HB A1C WAIVED: HB A1C (BAYER DCA - WAIVED): 7.4 % — ABNORMAL HIGH (ref <7.0)

## 2020-06-09 MED ORDER — ATORVASTATIN CALCIUM 20 MG PO TABS: 20.0000 mg | ORAL_TABLET | Freq: Every day | ORAL | 3 refills | Status: DC

## 2020-06-09 MED ORDER — LISINOPRIL 5 MG PO TABS: 5.0000 mg | ORAL_TABLET | Freq: Every day | ORAL | 3 refills | Status: DC

## 2020-06-09 MED ORDER — VICTOZA 18 MG/3ML ~~LOC~~ SOPN: 1.8000 mg | PEN_INJECTOR | Freq: Every day | SUBCUTANEOUS | 3 refills | Status: DC

## 2020-06-09 MED ORDER — HYDROCHLOROTHIAZIDE 25 MG PO TABS: 25.0000 mg | ORAL_TABLET | Freq: Every day | ORAL | 3 refills | Status: DC

## 2020-06-09 NOTE — Progress Notes (Signed)
BP 101/77   Pulse 90   Temp (!) 97.4 F (36.3 C)   Ht _0  (1.753 m)   Wt 273 lb (123.8 kg)   SpO2 98%   BMI 40.32 kg/m    Subjective:   Patient ID: Brandy Hernandez, female    DOB: Dec 05, 1965, 54 y.o.   MRN: [id number]  HPI: Brandy Hernandez is a 54 y.o. female presenting on 06/09/2020 for Medical Management of Chronic Issues and Diabetes   HPI Type 2 diabetes mellitus Patient comes in today for recheck of his diabetes. Patient has been currently taking Victoza, A1c is 7.4, she just recently had a surgery and has been more down, she will focus on being more active in the diet and being good and will get that down and see her back in 3 months.. Patient is currently on an ACE inhibitor/ARB. Patient has seen an ophthalmologist this year. Patient denies any issues with their feet. The symptom started onset as an adult hypertension and morbid obesity ARE RELATED TO DM   Hypertension Patient is currently on hydrochlorothiazide and lisinopril, and their blood pressure today is 101/77. Patient denies any lightheadedness or dizziness. Patient denies headaches, blurred vision, chest pains, shortness of breath, or weakness. Denies any side effects from medication and is content with current medication.   Patient's diabetes and hypertension are more complicated by the patient's morbid obesity.  Discussed weight loss and lifestyle modification and exercise with the patient.   Relevant past medical, surgical, family and social history reviewed and updated as indicated. Interim medical history since our last visit reviewed. Allergies and medications reviewed and updated.  Review of Systems  Constitutional: Negative for chills and fever.  Eyes: Negative for visual disturbance.  Respiratory: Negative for chest tightness and shortness of breath.   Cardiovascular: Negative for chest pain and leg swelling.  Musculoskeletal: Negative for back pain and gait problem.  Skin: Negative for rash.    Neurological: Negative for light-headedness and headaches.  Psychiatric/Behavioral: Negative for agitation and behavioral problems.  All other systems reviewed and are negative.   Per HPI unless specifically indicated above   Allergies as of 06/09/2020   No Known Allergies     Medication List       Accurate as of June 09, 2020  8:39 AM. If you have any questions, ask your nurse or doctor.        aspirin EC 81 MG tablet Take 1 tablet (81 mg total) by mouth daily.   atorvastatin 20 MG tablet Commonly known as: LIPITOR Take 1 tablet (20 mg total) by mouth daily.   B-D UF III MINI PEN NEEDLES 31G X 5 MM Misc Generic drug: Insulin Pen Needle 1 Device by Does not apply route daily.   hydrochlorothiazide 25 MG tablet Commonly known as: HYDRODIURIL TAKE 1 TABLET BY MOUTH EVERY DAY   lisinopril 5 MG tablet Commonly known as: ZESTRIL Take 1 tablet (5 mg total) by mouth daily.   methocarbamol 500 MG tablet Commonly known as: Robaxin Take 1 tablet (500 mg total) by mouth every 8 (eight) hours as needed for muscle spasms.   omeprazole 20 MG capsule Commonly known as: PRILOSEC Take 1 capsule (20 mg total) by mouth daily. This is to protect your stomach while taking NSAIDs   Victoza 18 MG/3ML Sopn Generic drug: liraglutide Inject 0.3 mLs (1.8 mg total) into the skin at bedtime.        Objective:   BP 101/77   Pulse 90  Temp (!) 97.4 F (36.3 C)   Ht _0  (1.753 m)   Wt 273 lb (123.8 kg)   SpO2 98%   BMI 40.32 kg/m   Wt Readings from Last 3 Encounters:  06/09/20 273 lb (123.8 kg)  01/25/20 275 lb 5 oz (124.9 kg)  01/20/20 275 lb 5 oz (124.9 kg)    Physical Exam Vitals and nursing note reviewed.  Constitutional:      General: She is not in acute distress.    Appearance: She is well-developed. She is not diaphoretic.  Eyes:     Conjunctiva/sclera: Conjunctivae normal.  Cardiovascular:     Rate and Rhythm: Normal rate and regular rhythm.     Heart  sounds: Normal heart sounds. No murmur heard.   Pulmonary:     Effort: Pulmonary effort is normal. No respiratory distress.     Breath sounds: Normal breath sounds. No wheezing.  Musculoskeletal:        General: No tenderness. Normal range of motion.  Skin:    General: Skin is warm and dry.     Findings: No rash.  Neurological:     Mental Status: She is alert and oriented to person, place, and time.     Coordination: Coordination normal.  Psychiatric:        Behavior: Behavior normal.       Assessment & Plan:   Problem List Items Addressed This Visit      Cardiovascular and Mediastinum   Hypertension associated with type 2 diabetes mellitus (HCC)   Relevant Medications   hydrochlorothiazide (HYDRODIURIL) 25 MG tablet   atorvastatin (LIPITOR) 20 MG tablet   liraglutide (VICTOZA) 18 MG/3ML SOPN   lisinopril (ZESTRIL) 5 MG tablet     Endocrine   T2DM (type 2 diabetes mellitus) (HCC)   Relevant Medications   atorvastatin (LIPITOR) 20 MG tablet   liraglutide (VICTOZA) 18 MG/3ML SOPN   lisinopril (ZESTRIL) 5 MG tablet   Other Relevant Orders   Bayer DCA Hb A1c Waived   CBC with Differential/Platelet   CMP14+EGFR   CMP14+EGFR   Diabetes mellitus (HCC) - Primary   Relevant Medications   atorvastatin (LIPITOR) 20 MG tablet   liraglutide (VICTOZA) 18 MG/3ML SOPN   lisinopril (ZESTRIL) 5 MG tablet   Other Relevant Orders   Bayer DCA Hb A1c Waived   CBC with Differential/Platelet   CMP14+EGFR   CMP14+EGFR     Other   Morbid obesity with BMI of 40.0-44.9, adult (HCC)   Relevant Medications   liraglutide (VICTOZA) 18 MG/3ML SOPN   Other Relevant Orders   Lipid panel    Other Visit Diagnoses    Encounter for hepatitis C screening test for low risk patient       Relevant Orders   Hepatitis C antibody   Hyperlipidemia associated with type 2 diabetes mellitus (HCC)       Relevant Medications   atorvastatin (LIPITOR) 20 MG tablet   liraglutide (VICTOZA) 18 MG/3ML SOPN    lisinopril (ZESTRIL) 5 MG tablet   Other Relevant Orders   Lipid panel      Continue current medication, will focus on diet and lifestyle modification. Follow up plan: Return in about 3 months (around 09/09/2020), or if symptoms worsen or fail to improve, for diabetes and htn.  Counseling provided for all of the vaccine components Orders Placed This Encounter  Procedures  . [facility] Hb A1c [place], MD [facility] 06/09/2020, 8:39 AM

## 2020-06-10 LAB — CMP14+EGFR
ALT: 30 IU/L (ref 0–32)
AST: 24 IU/L (ref 0–40)
Albumin/Globulin Ratio: 1.8 (ref 1.2–2.2)
Albumin: 4.2 g/dL (ref 3.8–4.9)
Alkaline Phosphatase: 95 IU/L (ref 48–121)
BUN/Creatinine Ratio: 12 (ref 9–23)
BUN: 9 mg/dL (ref 6–24)
Bilirubin Total: 0.5 mg/dL (ref 0.0–1.2)
CO2: 27 mmol/L (ref 20–29)
Calcium: 9.4 mg/dL (ref 8.7–10.2)
Chloride: 99 mmol/L (ref 96–106)
Creatinine, Ser: 0.73 mg/dL (ref 0.57–1.00)
GFR calc Af Amer: 108 mL/min/{1.73_m2} (ref >59)
GFR calc non Af Amer: 94 mL/min/{1.73_m2} (ref >59)
Globulin, Total: 2.4 g/dL (ref 1.5–4.5)
Glucose: 134 mg/dL — ABNORMAL HIGH (ref 65–99)
Potassium: 4 mmol/L (ref 3.5–5.2)
Sodium: 138 mmol/L (ref 134–144)
Total Protein: 6.6 g/dL (ref 6.0–8.5)

## 2020-06-10 LAB — HEPATITIS C ANTIBODY: Hep C Virus Ab: <0.1 s/co ratio (ref 0.0–0.9)

## 2020-06-10 LAB — LIPID PANEL
Chol/HDL Ratio: 3.6 ratio (ref 0.0–4.4)
Cholesterol, Total: 113 mg/dL (ref 100–199)
HDL: 31 mg/dL — ABNORMAL LOW (ref >39)
LDL Chol Calc (NIH): 64 mg/dL (ref 0–99)
Triglycerides: 92 mg/dL (ref 0–149)
VLDL Cholesterol Cal: 18 mg/dL (ref 5–40)

## 2020-06-10 LAB — CBC WITH DIFFERENTIAL/PLATELET
Basophils Absolute: 0 10*3/uL (ref 0.0–0.2)
Basos: 1 %
EOS (ABSOLUTE): 0.2 10*3/uL (ref 0.0–0.4)
Eos: 4 %
Hematocrit: 39.8 % (ref 34.0–46.6)
Hemoglobin: 13.3 g/dL (ref 11.1–15.9)
Immature Grans (Abs): 0 10*3/uL (ref 0.0–0.1)
Immature Granulocytes: 0 %
Lymphocytes Absolute: 1.8 10*3/uL (ref 0.7–3.1)
Lymphs: 34 %
MCH: 28.9 pg (ref 26.6–33.0)
MCHC: 33.4 g/dL (ref 31.5–35.7)
MCV: 86 fL (ref 79–97)
Monocytes Absolute: 0.4 10*3/uL (ref 0.1–0.9)
Monocytes: 8 %
Neutrophils Absolute: 2.8 10*3/uL (ref 1.4–7.0)
Neutrophils: 53 %
Platelets: 275 10*3/uL (ref 150–450)
RBC: 4.61 x10E6/uL (ref 3.77–5.28)
RDW: 12.8 % (ref 11.7–15.4)
WBC: 5.3 10*3/uL (ref 3.4–10.8)

## 2020-09-02 ENCOUNTER — Ambulatory Visit (INDEPENDENT_AMBULATORY_CARE_PROVIDER_SITE_OTHER): Payer: BC Managed Care – PPO | Admitting: Family Medicine

## 2020-09-02 ENCOUNTER — Encounter: Payer: Self-pay | Admitting: Family Medicine

## 2020-09-02 ENCOUNTER — Other Ambulatory Visit: Payer: Self-pay

## 2020-09-02 ENCOUNTER — Telehealth: Payer: Self-pay

## 2020-09-02 VITALS — BP 134/82 | HR 79 | Temp 98.0°F | Ht 69.0 in | Wt 274.0 lb

## 2020-09-02 DIAGNOSIS — E1159 Type 2 diabetes mellitus with other circulatory complications: Secondary | ICD-10-CM

## 2020-09-02 DIAGNOSIS — E119 Type 2 diabetes mellitus without complications: Secondary | ICD-10-CM | POA: Diagnosis not present

## 2020-09-02 DIAGNOSIS — I152 Hypertension secondary to endocrine disorders: Secondary | ICD-10-CM

## 2020-09-02 DIAGNOSIS — E1169 Type 2 diabetes mellitus with other specified complication: Secondary | ICD-10-CM | POA: Diagnosis not present

## 2020-09-02 DIAGNOSIS — Z6841 Body Mass Index (BMI) 40.0 and over, adult: Secondary | ICD-10-CM

## 2020-09-02 LAB — BAYER DCA HB A1C WAIVED: HB A1C (BAYER DCA - WAIVED): 6.9 % (ref <7.0)

## 2020-09-02 NOTE — Progress Notes (Signed)
BP 134/82   Pulse 79   Temp 98 F (36.7 C)   Ht 5\' 9"  (1.753 m)   Wt 274 lb (124.3 kg)   SpO2 100%   BMI 40.46 kg/m    Subjective:   Patient ID: Brandy Hernandez, female    DOB: 1966-02-18, 54 y.o.   MRN: [id number]  HPI: Brandy Hernandez is a 54 y.o. female presenting on 09/02/2020 for Medical Management of Chronic Issues, Diabetes, and Hypertension   HPI Type 2 diabetes mellitus Patient comes in today for recheck of his diabetes. Patient has been currently taking Victoza. Patient is currently on an ACE inhibitor/ARB. Patient has not seen an ophthalmologist this year. Patient denies any issues with their feet. The symptom started onset as an adult hypertension.jadmorbidobesity  ARE RELATED TO DM   Hypertension Patient is currently on lisinopril and hydrochlorothiazide, and their blood pressure today is 134/82. Patient denies any lightheadedness or dizziness. Patient denies headaches, blurred vision, chest pains, shortness of breath, or weakness. Denies any side effects from medication and is content with current medication.   Patient's hypertension and diabetes are more complicated by the patient's morbid obesity.  Discussed weight loss and lifestyle modification and exercise with the patient.   Relevant past medical, surgical, family and social history reviewed and updated as indicated. Interim medical history since our last visit reviewed. Allergies and medications reviewed and updated.  Review of Systems  Constitutional: Negative for chills and fever.  Eyes: Negative for visual disturbance.  Respiratory: Negative for chest tightness and shortness of breath.   Cardiovascular: Negative for chest pain and leg swelling.  Musculoskeletal: Negative for back pain and gait problem.  Skin: Negative for rash.  Neurological: Negative for dizziness, light-headedness and headaches.  Psychiatric/Behavioral: Negative for agitation and behavioral problems.  All other systems reviewed and  are negative.   Per HPI unless specifically indicated above   Allergies as of 09/02/2020   No Known Allergies     Medication List       Accurate as of September 02, 2020  9:07 AM. If you have any questions, ask your nurse or doctor.        aspirin EC 81 MG tablet Take 1 tablet (81 mg total) by mouth daily.   atorvastatin 20 MG tablet Commonly known as: LIPITOR Take 1 tablet (20 mg total) by mouth daily.   B-D UF III MINI PEN NEEDLES 31G X 5 MM Misc Generic drug: Insulin Pen Needle 1 Device by Does not apply route daily.   hydrochlorothiazide 25 MG tablet Commonly known as: HYDRODIURIL Take 1 tablet (25 mg total) by mouth daily.   lisinopril 5 MG tablet Commonly known as: ZESTRIL Take 1 tablet (5 mg total) by mouth daily.   Victoza 18 MG/3ML Sopn Generic drug: liraglutide Inject 0.3 mLs (1.8 mg total) into the skin at bedtime.        Objective:   BP 134/82   Pulse 79   Temp 98 F (36.7 C)   Ht 5\' 9"  (1.753 m)   Wt 274 lb (124.3 kg)   SpO2 100%   BMI 40.46 kg/m   Wt Readings from Last 3 Encounters:  09/02/20 274 lb (124.3 kg)  06/09/20 273 lb (123.8 kg)  01/25/20 275 lb 5 oz (124.9 kg)    Physical Exam Vitals and nursing note reviewed.  Constitutional:      General: She is not in acute distress.    Appearance: She is well-developed. She is not diaphoretic.  Eyes:     Conjunctiva/sclera: Conjunctivae normal.  Cardiovascular:     Rate and Rhythm: Normal rate and regular rhythm.     Heart sounds: Normal heart sounds. No murmur heard.   Pulmonary:     Effort: Pulmonary effort is normal. No respiratory distress.     Breath sounds: Normal breath sounds. No wheezing.  Musculoskeletal:        General: No tenderness. Normal range of motion.  Skin:    General: Skin is warm and dry.     Findings: No rash.  Neurological:     Mental Status: She is alert and oriented to person, place, and time.     Coordination: Coordination normal.  Psychiatric:         Behavior: Behavior normal.       Assessment & Plan:   Problem List Items Addressed This Visit      Cardiovascular and Mediastinum   Hypertension associated with type 2 diabetes mellitus ([place])     Endocrine   T2DM (type 2 diabetes mellitus) ([place])   Relevant Orders   Bayer DCA Hb A1c Waived   Diabetes mellitus ([place]) - Primary   Relevant Orders   Bayer DCA Hb A1c Waived     Other   Morbid obesity with BMI of 40.0-44.9, adult ([facility])      A1c is 6.9, this is improved from last time, continue current medication, no changes. Follow up plan: Return in about 3 months (around 12/03/2020), or if symptoms worsen or fail to improve, for htn and diabetes.  Counseling provided for all of the vaccine components Orders Placed This Encounter  Procedures  . [facility] Hb A1c Waived    Caryl Pina, MD [facility] 09/02/2020, 9:07 AM

## 2020-09-02 NOTE — Telephone Encounter (Signed)
error 

## 2020-12-06 ENCOUNTER — Ambulatory Visit: Payer: BC Managed Care – PPO | Admitting: Family Medicine

## 2020-12-12 ENCOUNTER — Other Ambulatory Visit: Payer: Self-pay | Admitting: *Deleted

## 2020-12-12 DIAGNOSIS — E1169 Type 2 diabetes mellitus with other specified complication: Secondary | ICD-10-CM

## 2020-12-12 DIAGNOSIS — E1159 Type 2 diabetes mellitus with other circulatory complications: Secondary | ICD-10-CM

## 2020-12-12 DIAGNOSIS — I152 Hypertension secondary to endocrine disorders: Secondary | ICD-10-CM

## 2020-12-12 MED ORDER — ASPIRIN EC 81 MG PO TBEC: 81.0000 mg | DELAYED_RELEASE_TABLET | Freq: Every day | ORAL | 3 refills | Status: DC

## 2021-02-20 ENCOUNTER — Other Ambulatory Visit: Payer: Self-pay | Admitting: Family Medicine

## 2021-02-20 DIAGNOSIS — E119 Type 2 diabetes mellitus without complications: Secondary | ICD-10-CM

## 2021-03-25 ENCOUNTER — Other Ambulatory Visit: Payer: Self-pay | Admitting: Family Medicine

## 2021-03-25 DIAGNOSIS — E119 Type 2 diabetes mellitus without complications: Secondary | ICD-10-CM

## 2021-03-27 NOTE — Telephone Encounter (Signed)
Dettinger NTBS 30 days given 02/20/21

## 2021-06-20 ENCOUNTER — Other Ambulatory Visit: Payer: Self-pay | Admitting: Family Medicine

## 2021-06-20 DIAGNOSIS — E1159 Type 2 diabetes mellitus with other circulatory complications: Secondary | ICD-10-CM

## 2021-06-20 DIAGNOSIS — I152 Hypertension secondary to endocrine disorders: Secondary | ICD-10-CM

## 2021-06-21 ENCOUNTER — Other Ambulatory Visit: Payer: Self-pay | Admitting: Family Medicine

## 2021-06-21 DIAGNOSIS — E1159 Type 2 diabetes mellitus with other circulatory complications: Secondary | ICD-10-CM

## 2021-06-21 DIAGNOSIS — E785 Hyperlipidemia, unspecified: Secondary | ICD-10-CM

## 2021-06-21 DIAGNOSIS — I152 Hypertension secondary to endocrine disorders: Secondary | ICD-10-CM

## 2021-06-21 DIAGNOSIS — E1169 Type 2 diabetes mellitus with other specified complication: Secondary | ICD-10-CM

## 2021-07-12 ENCOUNTER — Other Ambulatory Visit: Payer: Self-pay | Admitting: Family Medicine

## 2021-07-12 DIAGNOSIS — E1159 Type 2 diabetes mellitus with other circulatory complications: Secondary | ICD-10-CM

## 2021-07-12 DIAGNOSIS — I152 Hypertension secondary to endocrine disorders: Secondary | ICD-10-CM

## 2021-07-12 NOTE — Telephone Encounter (Signed)
dettinger NTBS 30 days given 06/20/21

## 2021-07-26 ENCOUNTER — Other Ambulatory Visit: Payer: Self-pay | Admitting: Family Medicine

## 2021-07-26 DIAGNOSIS — E1159 Type 2 diabetes mellitus with other circulatory complications: Secondary | ICD-10-CM

## 2021-07-26 DIAGNOSIS — I152 Hypertension secondary to endocrine disorders: Secondary | ICD-10-CM

## 2021-07-26 DIAGNOSIS — E785 Hyperlipidemia, unspecified: Secondary | ICD-10-CM

## 2021-07-26 DIAGNOSIS — E1169 Type 2 diabetes mellitus with other specified complication: Secondary | ICD-10-CM

## 2021-07-26 NOTE — Telephone Encounter (Signed)
Dettinger. NTBS 30 days given 06/21/21

## 2021-08-04 ENCOUNTER — Other Ambulatory Visit: Payer: Self-pay | Admitting: Family Medicine

## 2021-08-04 DIAGNOSIS — E1159 Type 2 diabetes mellitus with other circulatory complications: Secondary | ICD-10-CM

## 2021-08-29 ENCOUNTER — Other Ambulatory Visit: Payer: Self-pay | Admitting: Family Medicine

## 2021-08-29 DIAGNOSIS — E1159 Type 2 diabetes mellitus with other circulatory complications: Secondary | ICD-10-CM

## 2022-09-05 ENCOUNTER — Ambulatory Visit: Payer: BC Managed Care – PPO | Admitting: Family Medicine

## 2022-09-05 ENCOUNTER — Encounter: Payer: Self-pay | Admitting: Family Medicine

## 2022-09-05 DIAGNOSIS — S50811A Abrasion of right forearm, initial encounter: Secondary | ICD-10-CM | POA: Diagnosis not present

## 2022-09-05 DIAGNOSIS — T148XXA Other injury of unspecified body region, initial encounter: Secondary | ICD-10-CM

## 2022-09-05 DIAGNOSIS — S60511A Abrasion of right hand, initial encounter: Secondary | ICD-10-CM
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
## 2022-12-28 ENCOUNTER — Encounter: Payer: Self-pay | Admitting: Family Medicine
